# Patient Record
Sex: Female | Born: 1935
Health system: Southern US, Community
[De-identification: ages and names within clinical notes are randomized; demographics above are authoritative.]

## PROBLEM LIST (undated history)

## (undated) DIAGNOSIS — E785 Hyperlipidemia, unspecified: Secondary | ICD-10-CM

## (undated) DIAGNOSIS — I10 Essential (primary) hypertension: Secondary | ICD-10-CM

## (undated) DIAGNOSIS — E079 Disorder of thyroid, unspecified: Secondary | ICD-10-CM

## (undated) HISTORY — DX: Hyperlipidemia, unspecified: E78.5

---

## 2001-07-09 ENCOUNTER — Ambulatory Visit (HOSPITAL_COMMUNITY): Admission: RE | Admit: 2001-07-09 | Discharge: 2001-07-09 | Payer: Self-pay | Admitting: Specialist

## 2001-07-09 ENCOUNTER — Encounter: Payer: Self-pay | Admitting: Specialist

## 2001-08-28 ENCOUNTER — Other Ambulatory Visit: Admission: RE | Admit: 2001-08-28 | Discharge: 2001-08-28 | Payer: Self-pay | Admitting: Specialist

## 2002-07-19 ENCOUNTER — Ambulatory Visit (HOSPITAL_COMMUNITY): Admission: RE | Admit: 2002-07-19 | Discharge: 2002-07-19 | Payer: Self-pay | Admitting: Specialist

## 2002-07-19 ENCOUNTER — Encounter: Payer: Self-pay | Admitting: Specialist

## 2003-07-21 ENCOUNTER — Encounter: Payer: Self-pay | Admitting: Specialist

## 2003-07-21 ENCOUNTER — Ambulatory Visit (HOSPITAL_COMMUNITY): Admission: RE | Admit: 2003-07-21 | Discharge: 2003-07-21 | Payer: Self-pay | Admitting: Specialist

## 2004-01-26 ENCOUNTER — Ambulatory Visit (HOSPITAL_COMMUNITY): Admission: RE | Admit: 2004-01-26 | Discharge: 2004-01-26 | Payer: Self-pay | Admitting: Internal Medicine

## 2004-07-26 ENCOUNTER — Ambulatory Visit (HOSPITAL_COMMUNITY): Admission: RE | Admit: 2004-07-26 | Discharge: 2004-07-26 | Payer: Self-pay | Admitting: Specialist

## 2005-08-04 ENCOUNTER — Ambulatory Visit (HOSPITAL_COMMUNITY): Admission: RE | Admit: 2005-08-04 | Discharge: 2005-08-04 | Payer: Self-pay | Admitting: Family Medicine

## 2005-08-18 ENCOUNTER — Other Ambulatory Visit: Admission: RE | Admit: 2005-08-18 | Discharge: 2005-08-18 | Payer: Self-pay | Admitting: Dental General Practice

## 2006-01-16 ENCOUNTER — Ambulatory Visit (HOSPITAL_COMMUNITY): Admission: RE | Admit: 2006-01-16 | Discharge: 2006-01-16 | Payer: Self-pay | Admitting: Family Medicine

## 2006-01-27 ENCOUNTER — Ambulatory Visit (HOSPITAL_COMMUNITY): Admission: RE | Admit: 2006-01-27 | Discharge: 2006-01-27 | Payer: Self-pay | Admitting: Family Medicine

## 2006-08-08 ENCOUNTER — Ambulatory Visit (HOSPITAL_COMMUNITY): Admission: RE | Admit: 2006-08-08 | Discharge: 2006-08-08 | Payer: Self-pay | Admitting: Family Medicine

## 2007-08-13 ENCOUNTER — Ambulatory Visit (HOSPITAL_COMMUNITY): Admission: RE | Admit: 2007-08-13 | Discharge: 2007-08-13 | Payer: Self-pay | Admitting: Family Medicine

## 2008-02-29 ENCOUNTER — Ambulatory Visit (HOSPITAL_COMMUNITY): Admission: RE | Admit: 2008-02-29 | Discharge: 2008-02-29 | Payer: Self-pay | Admitting: Family Medicine

## 2008-08-15 ENCOUNTER — Ambulatory Visit (HOSPITAL_COMMUNITY): Admission: RE | Admit: 2008-08-15 | Discharge: 2008-08-15 | Payer: Self-pay | Admitting: Family Medicine

## 2009-04-17 ENCOUNTER — Ambulatory Visit (HOSPITAL_COMMUNITY): Admission: RE | Admit: 2009-04-17 | Discharge: 2009-04-17 | Payer: Self-pay | Admitting: Family Medicine

## 2009-08-19 ENCOUNTER — Ambulatory Visit (HOSPITAL_COMMUNITY): Admission: RE | Admit: 2009-08-19 | Discharge: 2009-08-19 | Payer: Self-pay | Admitting: Family Medicine

## 2010-01-27 ENCOUNTER — Ambulatory Visit (HOSPITAL_COMMUNITY): Admission: RE | Admit: 2010-01-27 | Discharge: 2010-01-27 | Payer: Self-pay | Admitting: Family Medicine

## 2010-08-24 ENCOUNTER — Ambulatory Visit (HOSPITAL_COMMUNITY): Admission: RE | Admit: 2010-08-24 | Discharge: 2010-08-24 | Payer: Self-pay | Admitting: Family Medicine

## 2011-05-06 NOTE — Op Note (Signed)
NAME:  FEVEN, ALDERFER                        ACCOUNT NO.:  [id number]   MEDICAL RECORD NO.:  [id number]                   PATIENT TYPE:  AMB   LOCATION:  DAY                                  FACILITY:  [facility]   PHYSICIAN:  Lionel December, M.D.                 DATE OF BIRTH:  Jan 18, 1929   DATE OF PROCEDURE:  01/26/2004  DATE OF DISCHARGE:                                 OPERATIVE REPORT   PROCEDURE:  Total colonoscopy.   ENDOSCOPIST:  Lionel December, M.D.   INDICATIONS:  Ms. Labree is a 77 year old Caucasian female who is undergoing  screening colonoscopy.  Family history is negative for colorectal carcinoma.  The procedure and risks were reviewed with the patient and informed consent  was obtained.   PREOPERATIVE MEDICATIONS:  Demerol 25 mg IV and Versed 3 mg IV in divided  dose   FINDINGS:  Procedure performed in endoscopy suite.  The patient's vital  signs and O2 saturation were monitored during the procedure and remained  stable.  The patient was placed in the left lateral recumbent position and  rectal examination was performed.  No abnormality noted on external or  digital exam.   Olympus videoscope was placed in the rectum and advanced under vision into  the sigmoid colon and beyond.  Preparation was excellent.  The scope was  passed to the cecum which was identified by appendiceal orifice and the  ileocecal valve.  Pictures were taken for the record.  As the scope was  withdrawn colonic mucosa was carefully examined and was normal throughout.  The rectal mucosa similarly was normal.   The scope was retroflexed to examine the anorectal junction and small  hemorrhoids were noted below the dentate line.  The endoscope was  straightened and withdrawn.  The patient tolerated the procedure well.   FINAL DIAGNOSIS:  Small external hemorrhoids, otherwise normal colonoscopy.   RECOMMENDATIONS:  1. She will resume her usual medications.  2. She will continue yearly Hemoccults  and will not need another screening     for CRC for 10 years.      ___________________________________________                                            Lionel December, M.D.   NR/MEDQ  D:  01/26/2004  T:  01/26/2004  Job:  [id number]   cc:   Leola Brazil, M.D.  [address] [place]  [place] [address]  Fax: [phone]   Patrica Duel, M.D.  [address]  [place]  [place] [address]  Fax: [phone]

## 2011-08-31 ENCOUNTER — Other Ambulatory Visit (HOSPITAL_COMMUNITY): Payer: Self-pay | Admitting: Internal Medicine

## 2011-08-31 DIAGNOSIS — Z139 Encounter for screening, unspecified: Secondary | ICD-10-CM

## 2011-09-06 ENCOUNTER — Ambulatory Visit (HOSPITAL_COMMUNITY): Payer: Medicare Other

## 2011-09-13 ENCOUNTER — Ambulatory Visit (HOSPITAL_COMMUNITY)
Admission: RE | Admit: 2011-09-13 | Discharge: 2011-09-13 | Disposition: A | Discharge status: Home / Self Care | Payer: Medicare Other | Source: Ambulatory Visit | Attending: Internal Medicine | Admitting: Internal Medicine

## 2011-09-13 DIAGNOSIS — Z1231 Encounter for screening mammogram for malignant neoplasm of breast: Secondary | ICD-10-CM | POA: Insufficient documentation

## 2011-09-13 DIAGNOSIS — Z139 Encounter for screening, unspecified: Secondary | ICD-10-CM

## 2012-03-13 ENCOUNTER — Other Ambulatory Visit (HOSPITAL_COMMUNITY): Payer: Self-pay | Admitting: Physician Assistant

## 2012-03-13 DIAGNOSIS — M810 Age-related osteoporosis without current pathological fracture: Secondary | ICD-10-CM

## 2012-03-13 DIAGNOSIS — Z6825 Body mass index (BMI) 25.0-25.9, adult: Secondary | ICD-10-CM | POA: Diagnosis not present

## 2012-03-13 DIAGNOSIS — I10 Essential (primary) hypertension: Secondary | ICD-10-CM | POA: Diagnosis not present

## 2012-03-19 ENCOUNTER — Ambulatory Visit (HOSPITAL_COMMUNITY)
Admission: RE | Admit: 2012-03-19 | Discharge: 2012-03-19 | Disposition: A | Discharge status: Home / Self Care | Payer: Medicare Other | Source: Ambulatory Visit | Attending: Physician Assistant | Admitting: Physician Assistant

## 2012-03-19 DIAGNOSIS — Z13820 Encounter for screening for osteoporosis: Secondary | ICD-10-CM | POA: Diagnosis not present

## 2012-03-19 DIAGNOSIS — Z780 Asymptomatic menopausal state: Secondary | ICD-10-CM | POA: Diagnosis not present

## 2012-03-19 DIAGNOSIS — M810 Age-related osteoporosis without current pathological fracture: Secondary | ICD-10-CM

## 2012-03-19 DIAGNOSIS — M899 Disorder of bone, unspecified: Secondary | ICD-10-CM | POA: Insufficient documentation

## 2012-03-19 DIAGNOSIS — M949 Disorder of cartilage, unspecified: Secondary | ICD-10-CM | POA: Insufficient documentation

## 2012-04-06 ENCOUNTER — Encounter (HOSPITAL_COMMUNITY): Payer: Self-pay | Admitting: *Deleted

## 2012-04-06 ENCOUNTER — Emergency Department (HOSPITAL_COMMUNITY)
Admission: EM | Admit: 2012-04-06 | Discharge: 2012-04-06 | Disposition: A | Discharge status: Home / Self Care | Payer: Medicare Other | Attending: Emergency Medicine | Admitting: Emergency Medicine

## 2012-04-06 DIAGNOSIS — E079 Disorder of thyroid, unspecified: Secondary | ICD-10-CM | POA: Insufficient documentation

## 2012-04-06 DIAGNOSIS — E869 Volume depletion, unspecified: Secondary | ICD-10-CM | POA: Diagnosis not present

## 2012-04-06 DIAGNOSIS — R61 Generalized hyperhidrosis: Secondary | ICD-10-CM | POA: Diagnosis not present

## 2012-04-06 DIAGNOSIS — E876 Hypokalemia: Secondary | ICD-10-CM | POA: Insufficient documentation

## 2012-04-06 DIAGNOSIS — IMO0001 Reserved for inherently not codable concepts without codable children: Secondary | ICD-10-CM | POA: Insufficient documentation

## 2012-04-06 DIAGNOSIS — E860 Dehydration: Secondary | ICD-10-CM | POA: Insufficient documentation

## 2012-04-06 DIAGNOSIS — R5381 Other malaise: Secondary | ICD-10-CM | POA: Diagnosis not present

## 2012-04-06 DIAGNOSIS — R350 Frequency of micturition: Secondary | ICD-10-CM | POA: Diagnosis not present

## 2012-04-06 DIAGNOSIS — R197 Diarrhea, unspecified: Secondary | ICD-10-CM | POA: Insufficient documentation

## 2012-04-06 DIAGNOSIS — R110 Nausea: Secondary | ICD-10-CM | POA: Insufficient documentation

## 2012-04-06 DIAGNOSIS — N390 Urinary tract infection, site not specified: Secondary | ICD-10-CM | POA: Diagnosis not present

## 2012-04-06 DIAGNOSIS — R42 Dizziness and giddiness: Secondary | ICD-10-CM | POA: Diagnosis not present

## 2012-04-06 HISTORY — DX: Disorder of thyroid, unspecified: E07.9

## 2012-04-06 LAB — URINE MICROSCOPIC-ADD ON

## 2012-04-06 LAB — URINALYSIS, ROUTINE W REFLEX MICROSCOPIC
Glucose, UA: NEGATIVE mg/dL
Nitrite: POSITIVE — AB
Protein, ur: 30 mg/dL — AB
Specific Gravity, Urine: 1.015 (ref 1.005–1.030)
pH: 6 (ref 5.0–8.0)

## 2012-04-06 LAB — BASIC METABOLIC PANEL
CO2: 28 mEq/L (ref 19–32)
GFR calc Af Amer: 58 mL/min — ABNORMAL LOW (ref >90)
GFR calc non Af Amer: 50 mL/min — ABNORMAL LOW (ref >90)
Glucose, Bld: 143 mg/dL — ABNORMAL HIGH (ref 70–99)

## 2012-04-06 LAB — DIFFERENTIAL
Eosinophils Relative: 0 % (ref 0–5)
Monocytes Absolute: 0.4 10*3/uL (ref 0.1–1.0)
Neutro Abs: 4.9 10*3/uL (ref 1.7–7.7)
Neutrophils Relative %: 77 % (ref 43–77)

## 2012-04-06 LAB — CBC
HCT: 33.9 % — ABNORMAL LOW (ref 36.0–46.0)
MCH: 30.5 pg (ref 26.0–34.0)
MCHC: 35.7 g/dL (ref 30.0–36.0)
MCV: 85.4 fL (ref 78.0–100.0)
Platelets: 113 10*3/uL — ABNORMAL LOW (ref 150–400)

## 2012-04-06 LAB — CARDIAC PANEL(CRET KIN+CKTOT+MB+TROPI): Relative Index: 1.4 (ref 0.0–2.5)

## 2012-04-06 MED ORDER — DEXTROSE 5 % IV SOLN
1.0000 g | Freq: Once | INTRAVENOUS | Status: AC
  Administered 2012-04-06: 1 g via INTRAVENOUS
  Filled 2012-04-06: qty 10

## 2012-04-06 MED ORDER — POTASSIUM CHLORIDE 20 MEQ/15ML (10%) PO LIQD
40.0000 meq | Freq: Once | ORAL | Status: AC
  Administered 2012-04-06: 40 meq via ORAL
  Filled 2012-04-06: qty 30

## 2012-04-06 MED ORDER — SODIUM CHLORIDE 0.9 % IV BOLUS (SEPSIS)
500.0000 mL | Freq: Once | INTRAVENOUS | Status: AC
  Administered 2012-04-06: 500 mL via INTRAVENOUS

## 2012-04-06 MED ORDER — ONDANSETRON HCL 4 MG/2ML IJ SOLN
INTRAMUSCULAR | Status: AC
  Administered 2012-04-06: 4 mg
  Filled 2012-04-06: qty 2

## 2012-04-06 MED ORDER — CEPHALEXIN 500 MG PO CAPS: 500.0000 mg | ORAL_CAPSULE | Freq: Three times a day (TID) | ORAL | Status: AC

## 2012-04-06 MED ORDER — POTASSIUM CHLORIDE 10 MEQ/100ML IV SOLN
10.0000 meq | Freq: Once | INTRAVENOUS | Status: AC
  Administered 2012-04-06: 10 meq via INTRAVENOUS
  Filled 2012-04-06: qty 100

## 2012-04-06 NOTE — ED Notes (Signed)
Pt c/o burning at IV site. Potassium infusion slowed to 50 ml/hr.

## 2012-04-06 NOTE — ED Notes (Addendum)
Pt presents to er from urgent care with c/o generalized weakness, feeling tired, not eating well since April 3, pt states that she started feeling "bad" after she began to take a new thyroid medication, states that she did stop taking the medication this past Sunday, admits to nausea, diarrhea. denies any vomiting, urgent care advised that pt was orthostatic with bp of 124/64 lying, 110/60 sitting, 114/60 standing. Pt states that she does get dizzy with position changes.

## 2012-04-06 NOTE — ED Provider Notes (Signed)
History  This chart was scribed for Lori Gaskins, MD by Cherlynn Perches. The patient was seen in room APA07/APA07. Patient's care was started at 1144.    CSN: [id number]  Arrival date & time 04/06/12  1144   First MD Initiated Contact with Patient 04/06/12 1154      Chief Complaint  Patient presents with  . Weakness  . Dizziness     HPI Lori Edwards is a 78 y.o. female with a h/o thyroid disease who presents to the Emergency Department complaining of about 3 weeks of sudden onset, unchanged, constant fatigue with associated night sweats, dizziness, muscle aches, diarrhea, decreased appetite, and nausea. Pt also reports having one episode of syncope 5 days ago but denies any chest pain.  She reports brief episode of LOC but no HA reported and no CP/SOB with syncope  Pt states that symptoms began after taking a new thyroid medication (Synthroid) prescribed by PCP. Pt stopped taking medication 5 days ago but symptoms have persisted. Pt was referred to the ED after visiting urgent care. Pt denies vomiting, chest pain, cough, confusion, and HA. Pt denies smoking and alcohol use. Nothing improves her symptoms Nothing worsens her symptoms   Past Medical History  Diagnosis Date  . Thyroid disease     History reviewed. No pertinent past surgical history.  No family history on file.  History  Substance Use Topics  . Smoking status: Never Smoker   . Smokeless tobacco: Not on file  . Alcohol Use: No    OB History    Grav Para Term Preterm Abortions TAB SAB Ect Mult Living                  Review of Systems 10 Systems reviewed and all are negative for acute change except as noted in the HPI.   Allergies  Review of patient's allergies indicates no known allergies.  Home Medications   Current Outpatient Rx  Name Route Sig Dispense Refill  . ALENDRONATE SODIUM 70 MG PO TABS Oral Take 70 mg by mouth every 7 (seven) days. Take with a full glass of water on an empty  stomach. On Sunday    . AMLODIPINE BESYLATE 5 MG PO TABS Oral Take 5 mg by mouth daily.    . ASPIRIN EC 81 MG PO TBEC Oral Take 81 mg by mouth daily.    . OMEGA-3 FATTY ACIDS 1000 MG PO CAPS Oral Take 1 g by mouth daily.    Marland Kitchen HYDROCHLOROTHIAZIDE 25 MG PO TABS Oral Take 12.5 mg by mouth daily.    . ONE-A-DAY ENERGY PO Oral Take 1 tablet by mouth daily.    Frazier Butt OP Ophthalmic Apply 1 drop to eye daily as needed. Dry Eyes    . VITAMIN D (CHOLECALCIFEROL) PO Oral Take 1 capsule by mouth daily.      BP 120/50  Pulse 77  Temp(Src) 97.8 F (36.6 C) (Oral)  Resp 18  Ht 5' (1.524 m)  Wt 125 lb (56.7 kg)  BMI 24.41 kg/m2  SpO2 96%  Physical Exam CONSTITUTIONAL: Well developed/well nourished HEAD AND FACE: Normocephalic/atraumatic EYES: EOMI/PERRL ENMT: Mucous membranes moist NECK: supple no meningeal signs SPINE:entire spine nontender CV: S1/S2 noted, no murmurs/rubs/gallops noted LUNGS: Lungs are clear to auscultation bilaterally, no apparent distress ABDOMEN: soft, nontender, no rebound or guarding GU:no cva tenderness NEURO: Pt is awake/alert, moves all extremitiesx4 EXTREMITIES: pulses normal, full ROM SKIN: warm, color normal PSYCH: no abnormalities of mood noted  ED Course  Procedures   DIAGNOSTIC STUDIES: Oxygen Saturation is 96% on room air, adequate by my interpretation.    COORDINATION OF CARE: 12:33PM - Patient understands and agrees with initial ED impression and plan with expectations set for ED visit. 1:37 PM Dehydration, uti, and hypokalemia noted Will treat and reassess Pt is in no distress, well appearing, nontoxic 2:06PM- Patient informed of current labs.   3:36 PM Pt ambulatory She feels comfortable for d/c, she is awake/alert, normal mental status.  Denies any cp/sob I advised her to stop HCTZ as likely cause of dehydration and hypokalemia hypoK without signs of EKG changes This was replaced today I will start her on keflex for uti I will also  arrange f/u with PCP Family at bedside they feel comfortable with plan.  Suspect hyponatremia from dehydration  The patient appears reasonably screened and/or stabilized for discharge and I doubt any other medical condition or other [facility] requiring further screening, evaluation, or treatment in the ED at this time prior to discharge.   Labs Reviewed  CBC - Abnormal; Notable for the following:    HCT 33.9 (*)    Platelets 113 (*)    All other components within normal limits  BASIC METABOLIC PANEL - Abnormal; Notable for the following:    Sodium 126 (*)    Potassium 2.7 (*)    Chloride 87 (*)    Glucose, Bld 143 (*)    BUN 25 (*)    GFR calc non Af Amer 50 (*)    GFR calc Af Amer 58 (*)    All other components within normal limits  URINALYSIS, ROUTINE W REFLEX MICROSCOPIC - Abnormal; Notable for the following:    APPearance CLOUDY (*)    Hgb urine dipstick MODERATE (*)    Protein, ur 30 (*)    Nitrite POSITIVE (*)    Leukocytes, UA SMALL (*)    All other components within normal limits  CARDIAC PANEL(CRET KIN+CKTOT+MB+TROPI) - Abnormal; Notable for the following:    Total CK 227 (*)    All other components within normal limits  URINE MICROSCOPIC-ADD ON - Abnormal; Notable for the following:    Bacteria, UA MANY (*)    Casts GRANULAR CAST (*)    All other components within normal limits  DIFFERENTIAL     MDM  Nursing notes reviewed and considered in documentation All labs/vitals reviewed and considered      Date: 04/06/2012  Rate: 74  Rhythm: normal sinus rhythm  QRS Axis: normal  Intervals: normal  ST/T Wave abnormalities: nonspecific ST changes  Conduction Disutrbances:none    I personally performed the services described in this documentation, which was scribed in my presence. The recorded information has been reviewed and considered.      Lori Gaskins, MD 04/06/12 1538

## 2012-04-06 NOTE — ED Notes (Signed)
Pt up to bathroom. Ambulates without difficulty.

## 2012-04-06 NOTE — Discharge Instructions (Signed)
Please have your electrolytes (potassium) rechecked within 48-72 hours either in the ER or at your primary care doctor.  Stop your HCTZ as we discussed  Dehydration, Elderly Dehydration is when you lose more fluids from the body than you take in. Vital organs such as the kidneys, brain, and heart cannot function without a proper amount of fluids and salt. Any loss of fluids from the body can cause dehydration.  Older adults are at a higher risk of dehydration than younger adults. As we age, our bodies are less able to conserve water and do not respond to temperature changes as well. Also, older adults do not become thirsty as easily or quickly. Because of this, older adults often do not realize they need to increase fluids to avoid dehydration.  CAUSES   Vomiting.   Diarrhea.   Excessive sweating.   Excessive urine output.   Fever.  SYMPTOMS  Mild dehydration  Thirst.   Dry lips.   Slightly dry mouth.  Moderate dehydration  Very dry mouth.   Sunken eyes.   Skin does not bounce back quickly when lightly pinched and released.   Dark urine and decreased urine production.   Decreased tear production.   Headache.  Severe dehydration  Very dry mouth.   Extreme thirst.   Rapid, weak pulse (more than 100 beats per minute at rest).   Cold hands and feet.   Not able to sweat in spite of heat.   Rapid breathing.   Blue lips.   Confusion and lethargy.   Difficulty being awakened.   Minimal urine production.   No tears.  DIAGNOSIS  Your caregiver will diagnose dehydration based on your symptoms and your exam. Blood and urine tests will help confirm the diagnosis. The diagnostic evaluation should also identify the cause of dehydration. TREATMENT  Treatment of mild or moderate dehydration can often be done at home by increasing the amount of fluids that you drink. It is best to drink small amounts of fluid more often. Drinking too much at one time can make vomiting  worse.  Severe dehydration needs to be treated at the hospital where you will probably be given intravenous (IV) fluids that contain water and electrolytes. HOME CARE INSTRUCTIONS   Ask your caregiver about specific rehydration instructions.   Drink enough fluids to keep your urine clear or pale yellow.   Drink small amounts frequently if you have nausea and vomiting.   Eat as you normally do.   Avoid:   Foods or drinks high in sugar.   Carbonated drinks.   Juice.   Extremely hot or cold fluids.   Drinks with caffeine.   Fatty, greasy foods.   Alcohol.   Tobacco.   Overeating.   Gelatin desserts.   Wash your hands well to avoid spreading bacteria and viruses.   Only take over-the-counter or prescription medicines for pain, discomfort, or fever as directed by your caregiver.   Ask your caregiver if you should continue all prescribed and over-the-counter medicines.   Keep all follow-up appointments with your caregiver.  SEEK MEDICAL CARE IF:  You have abdominal pain and it increases or stays in one area (localizes).   You have a rash, stiff neck, or severe headache.   You are irritable, sleepy, or difficult to awaken.   You are weak, dizzy, or extremely thirsty.  SEEK IMMEDIATE MEDICAL CARE IF:   You are unable to keep fluids down, or you get worse despite treatment.   You have frequent episodes  of vomiting or diarrhea.   You have blood or green matter (bile) in your vomit.   You have blood in your stool or your stool looks black and tarry.   You have not urinated in 6 to 8 hours, or you have only urinated a small amount of very dark urine.   You have a fever.   You faint.  MAKE SURE YOU:   Understand these instructions.   Will watch your condition.   Will get help right away if you are not doing well or get worse.  Document Released: 02/25/2004 Document Revised: 11/24/2011 Document Reviewed: 07/25/2011 [facility] Patient Information 2012  [place], [place].

## 2012-04-06 NOTE — ED Notes (Signed)
CRITICAL VALUE ALERT  Critical value received:  K-2.7  Date of notification:  04/06/12  Time of notification:  1318  Critical value read back:yes  Nurse who received alert:  t abbott rn  MD notified (1st page):  [place]  Time of first page:  1318  MD notified (2nd page):  Time of second page:  Responding MD:  Bebe Shaggy  Time MD responded:  1318

## 2012-04-09 ENCOUNTER — Encounter (HOSPITAL_COMMUNITY): Payer: Self-pay | Admitting: Emergency Medicine

## 2012-04-09 ENCOUNTER — Emergency Department (HOSPITAL_COMMUNITY)
Admission: EM | Admit: 2012-04-09 | Discharge: 2012-04-09 | Disposition: A | Discharge status: Home / Self Care | Payer: Medicare Other | Attending: Emergency Medicine | Admitting: Emergency Medicine

## 2012-04-09 DIAGNOSIS — R7989 Other specified abnormal findings of blood chemistry: Secondary | ICD-10-CM | POA: Diagnosis not present

## 2012-04-09 DIAGNOSIS — E876 Hypokalemia: Secondary | ICD-10-CM | POA: Diagnosis not present

## 2012-04-09 LAB — DIFFERENTIAL
Basophils Relative: 3 % — ABNORMAL HIGH (ref 0–1)
Eosinophils Absolute: 0 10*3/uL (ref 0.0–0.7)
Monocytes Relative: 12 % (ref 3–12)
Neutrophils Relative %: 35 % — ABNORMAL LOW (ref 43–77)

## 2012-04-09 LAB — COMPREHENSIVE METABOLIC PANEL
ALT: 300 U/L — ABNORMAL HIGH (ref 0–35)
AST: 359 U/L — ABNORMAL HIGH (ref 0–37)
Alkaline Phosphatase: 293 U/L — ABNORMAL HIGH (ref 39–117)
CO2: 28 mEq/L (ref 19–32)
Chloride: 100 mEq/L (ref 96–112)
GFR calc non Af Amer: 77 mL/min — ABNORMAL LOW (ref >90)
Sodium: 136 mEq/L (ref 135–145)
Total Bilirubin: 0.7 mg/dL (ref 0.3–1.2)

## 2012-04-09 LAB — URINE CULTURE

## 2012-04-09 LAB — CBC
Hemoglobin: 11.6 g/dL — ABNORMAL LOW (ref 12.0–15.0)
MCH: 30.5 pg (ref 26.0–34.0)
MCHC: 34.8 g/dL (ref 30.0–36.0)
Platelets: 230 10*3/uL (ref 150–400)
RDW: 13.5 % (ref 11.5–15.5)

## 2012-04-09 MED ORDER — POTASSIUM CHLORIDE CRYS ER 10 MEQ PO TBCR: 10.0000 meq | EXTENDED_RELEASE_TABLET | Freq: Every day | ORAL | Status: DC

## 2012-04-09 MED ORDER — POTASSIUM CHLORIDE CRYS ER 20 MEQ PO TBCR
60.0000 meq | EXTENDED_RELEASE_TABLET | Freq: Once | ORAL | Status: AC
  Administered 2012-04-09: 60 meq via ORAL
  Filled 2012-04-09: qty 3

## 2012-04-09 NOTE — ED Provider Notes (Signed)
History   This chart was scribed for [employer]. Colon Branch, MD by Brooks Sailors. The patient was seen in room APA10/APA10. Patient's care was started at 0924.   CSN: [id number]  Arrival date & time 04/09/12  7829   First MD Initiated Contact with Patient 04/09/12 1116      Chief Complaint  Patient presents with  . Follow-up    (Consider location/radiation/quality/duration/timing/severity/associated sxs/prior treatment) HPI Lori Edwards is a 78 y.o. female who presents to the Emergency Department for a follow up visit to have her potassium checked. Patient was at Urgent Care  04/06/12 and sent to the ER for futher work up. Here today for recheck of labs. Patient says she recently was on thyroid medication as a result of being called by PCP with low thyroid labs.She stated her illness began with taking the thyroid medicines. She stopped taking them 04/07/12.  Dr. Sherwood Gambler - PCP  Past Medical History  Diagnosis Date  . Thyroid disease     History reviewed. No pertinent past surgical history.  No family history on file.  History  Substance Use Topics  . Smoking status: Never Smoker   . Smokeless tobacco: Not on file  . Alcohol Use: No    OB History    Grav Para Term Preterm Abortions TAB SAB Ect Mult Living                  Review of Systems  Constitutional: Negative for fever.  HENT: Negative for congestion.   Eyes: Negative for discharge and redness.  Respiratory: Negative for cough and shortness of breath.   Cardiovascular: Negative for chest pain.  Gastrointestinal: Negative for vomiting and abdominal pain.  Musculoskeletal: Negative for back pain.  Skin: Negative for rash.  Neurological: Negative for syncope, numbness and headaches.  Psychiatric/Behavioral:       No behavior change.  All other systems reviewed and are negative.    Allergies  Review of patient's allergies indicates no known allergies.  Home Medications   Current Outpatient Rx  Name Route Sig  Dispense Refill  . ALENDRONATE SODIUM 70 MG PO TABS Oral Take 70 mg by mouth every 7 (seven) days. Take with a full glass of water on an empty stomach. On Sunday    . AMLODIPINE BESYLATE 5 MG PO TABS Oral Take 5 mg by mouth daily.    . ASPIRIN EC 81 MG PO TBEC Oral Take 81 mg by mouth daily.    . CEPHALEXIN 500 MG PO CAPS Oral Take 1 capsule (500 mg total) by mouth 3 (three) times daily. 30 capsule 0  . OMEGA-3 FATTY ACIDS 1000 MG PO CAPS Oral Take 1 g by mouth daily.    . ONE-A-DAY ENERGY PO Oral Take 1 tablet by mouth daily.    Frazier Butt OP Ophthalmic Apply 1 drop to eye daily as needed. Dry Eyes    . VITAMIN D (CHOLECALCIFEROL) PO Oral Take 1 capsule by mouth daily.      BP 135/65  Pulse 73  Temp 98.6 F (37 C)  Resp 16  Ht 5' (1.524 m)  Wt 120 lb (54.432 kg)  BMI 23.44 kg/m2  SpO2 99%  Physical Exam  Nursing note and vitals reviewed. Constitutional: She is oriented to person, place, and time. She appears well-developed and well-nourished.  HENT:  Head: Normocephalic and atraumatic.  Eyes: Conjunctivae and EOM are normal. Pupils are equal, round, and reactive to light.  Neck: Normal range of motion. Neck supple.  Cardiovascular: Normal rate and regular rhythm.   Pulmonary/Chest: Effort normal and breath sounds normal.  Abdominal: Soft. Bowel sounds are normal.  Musculoskeletal: Normal range of motion.  Neurological: She is alert and oriented to person, place, and time.  Skin: Skin is warm and dry.  Psychiatric: She has a normal mood and affect.    ED Course  Procedures (including critical care time) DIAGNOSTIC STUDIES: Oxygen Saturation is 99% on room air, normal by my interpretation.   Results for orders placed during the hospital encounter of 04/09/12  COMPREHENSIVE METABOLIC PANEL      Component Value Range   Sodium 136  135 - 145 (mEq/L)   Potassium 3.2 (*) 3.5 - 5.1 (mEq/L)   Chloride 100  96 - 112 (mEq/L)   CO2 28  19 - 32 (mEq/L)   Glucose, Bld 134 (*) 70 -  99 (mg/dL)   BUN 9  6 - 23 (mg/dL)   Creatinine, Ser 9.60  0.50 - 1.10 (mg/dL)   Calcium 8.7  8.4 - 45.4 (mg/dL)   Total Protein 6.9  6.0 - 8.3 (g/dL)   Albumin 3.0 (*) 3.5 - 5.2 (g/dL)   AST 098 (*) 0 - 37 (U/L)   ALT 300 (*) 0 - 35 (U/L)   Alkaline Phosphatase 293 (*) 39 - 117 (U/L)   Total Bilirubin 0.7  0.3 - 1.2 (mg/dL)   GFR calc non Af Amer 77 (*) >90 (mL/min)   GFR calc Af Amer 90 (*) >90 (mL/min)  CBC      Component Value Range   WBC 7.9  4.0 - 10.5 (K/uL)   RBC 3.80 (*) 3.87 - 5.11 (MIL/uL)   Hemoglobin 11.6 (*) 12.0 - 15.0 (g/dL)   HCT 11.9 (*) 14.7 - 46.0 (%)   MCV 87.6  78.0 - 100.0 (fL)   MCH 30.5  26.0 - 34.0 (pg)   MCHC 34.8  30.0 - 36.0 (g/dL)   RDW 82.9  56.2 - 13.0 (%)   Platelets 230  150 - 400 (K/uL)  DIFFERENTIAL      Component Value Range   Neutrophils Relative 35 (*) 43 - 77 (%)   Neutro Abs 2.8  1.7 - 7.7 (K/uL)   Lymphocytes Relative 49 (*) 12 - 46 (%)   Lymphs Abs 3.9  0.7 - 4.0 (K/uL)   Monocytes Relative 12  3 - 12 (%)   Monocytes Absolute 1.0  0.1 - 1.0 (K/uL)   Eosinophils Relative 0  0 - 5 (%)   Eosinophils Absolute 0.0  0.0 - 0.7 (K/uL)   Basophils Relative 3 (*) 0 - 1 (%)   Basophils Absolute 0.2 (*) 0.0 - 0.1 (K/uL)   WBC Morphology ATYPICAL LYMPHOCYTES     COORDINATION OF CARE: 11:20AM-  Patient to be given potassium at hospital and sent home  with prescription and to follow up with Dr. Sherwood Gambler.     No results found.   No diagnosis found.    MDM  Patient here for recheck of potassium from visit on 04/06/12 when she presented with nausea, vomiting and weakness. Repeat labs with continued low potassium. LFTs ar elevated. None to compare. Patient has a follow up visit with Dr. Sherwood Gambler on 04/17/12. She will remain off HCTZ, continue taking potassium.She will have labs redrawn at that visit and will discuss thyroid medicines. She refuses to restart thyroid medicines at this time. Pt stable in ED with no significant deterioration in  condition.The patient appears reasonably screened and/or stabilized for discharge and  I doubt any other medical condition or other [facility] requiring further screening, evaluation, or treatment in the ED at this time prior to discharge.  I personally performed the services described in this documentation, which was scribed in my presence. The recorded information has been reviewed and considered.   MDM Reviewed: previous chart, nursing note and vitals Reviewed previous: labs Interpretation: labs            Nicoletta Dress. Colon Branch, MD 04/09/12 2211

## 2012-04-09 NOTE — Discharge Instructions (Signed)
Your blood work here showed  A slightly low potassium at 3.2  When you were here the other day it was 2.7  Your liver numbers are high. When you see Dr. Sherwood Gambler on 04/17/12 have him recheck those numbers as well as your potassium. Continue NOT to take your hydroclorothiazide. Dr. Sherwood Gambler will let you know if you need to re start that medicine.   Hypokalemia Hypokalemia means a low potassium level in the blood.Potassium is an electrolyte that helps regulate the amount of fluid in the body. It also stimulates muscle contraction and maintains a stable acid-base balance.Most of the body's potassium is inside of cells, and only a very small amount is in the blood. Because the amount in the blood is so small, minor changes can have big effects. PREPARATION FOR TEST Testing for potassium requires taking a blood sample taken by needle from a vein in the arm. The skin is cleaned thoroughly before the sample is drawn. There is no other special preparation needed. NORMAL VALUES Potassium levels below 3.5 mEq/L are abnormally low. Levels above 5.1 mEq/L are abnormally high. Ranges for normal findings may vary among different laboratories and hospitals. You should always check with your doctor after having lab work or other tests done to discuss the meaning of your test results and whether your values are considered within normal limits. MEANING OF TEST  Your caregiver will go over the test results with you and discuss the importance and meaning of your results, as well as treatment options and the need for additional tests, if necessary. A potassium level is frequently part of a routine medical exam. It is usually included as part of a whole "panel" of tests for several blood salts (such as Sodium and Chloride). It may be done as part of follow-up when a low potassium level was found in the past or other blood salts are suspected of being out of balance. A low potassium level might be suspected if you have one or more  of the following:  Symptoms of weakness.   Abnormal heart rhythms.   High blood pressure and are taking medication to control this, especially water pills (diuretics).   Kidney disease that can affect your potassium level .   Diabetes requiring the use of insulin. The potassium may fall after taking insulin, especially if the diabetes had been out of control for a while.   A condition requiring the use of cortisone-type medication or certain types of antibiotics.   Vomiting and/or diarrhea for more than a day or two.   A stomach or intestinal condition that may not permit appropriate absorption of potassium.   Fainting episodes.   Mental confusion.  OBTAINING TEST RESULTS It is your responsibility to obtain your test results. Ask the lab or department performing the test when and how you will get your results.  Please contact your caregiver directly if you have not received the results within one week. At that time, ask if there is anything different or new you should be doing in relation to the results. TREATMENT Hypokalemia can be treated with potassium supplements taken by mouth and/or adjustments in your current medications. A diet high in potassium is also helpful. Foods with high potassium content are:  Peas, lentils, lima beans, nuts, and dried fruit.   Whole grain and bran cereals and breads.   Fresh fruit, vegetables (bananas, cantaloupe, grapefruit, oranges, tomatoes, honeydew melons, potatoes).   Orange and tomato juices.   Meats. If potassium supplement has been  prescribed for you today or your medications have been adjusted, see your personal caregiver in time02 for a re-check.  SEEK MEDICAL CARE IF:  There is a feeling of worsening weakness.   You experience repeated chest palpitations.   You are diabetic and having difficulty keeping your blood sugars in the normal range.   You are experiencing vomiting and/or diarrhea.   You are having difficulty with  any of your regular medications.  SEEK IMMEDIATE MEDICAL CARE IF:  You experience chest pain, shortness of breath, or episodes of dizziness.   You have been having vomiting or diarrhea for more than 2 days.   You have a fainting episode.  MAKE SURE YOU:   Understand these instructions.   Will watch your condition.   Will get help right away if you are not doing well or get worse.  Document Released: 12/05/2005 Document Revised: 11/24/2011 Document Reviewed: 11/15/2008 [facility] Patient Information 2012 [place], [place].

## 2012-04-09 NOTE — ED Notes (Signed)
Pt states she is here to have her potassium re-checked. Pt here on 4/19 with hypokalemia.

## 2012-04-10 NOTE — ED Notes (Addendum)
+   Urine Patient treated with Keflex-sensitive to same- chart appended per protocol MD,

## 2012-04-17 DIAGNOSIS — E876 Hypokalemia: Secondary | ICD-10-CM | POA: Diagnosis not present

## 2012-04-17 DIAGNOSIS — D539 Nutritional anemia, unspecified: Secondary | ICD-10-CM | POA: Diagnosis not present

## 2012-04-17 DIAGNOSIS — I10 Essential (primary) hypertension: Secondary | ICD-10-CM | POA: Diagnosis not present

## 2012-04-17 DIAGNOSIS — D649 Anemia, unspecified: Secondary | ICD-10-CM | POA: Diagnosis not present

## 2012-04-17 DIAGNOSIS — R5383 Other fatigue: Secondary | ICD-10-CM | POA: Diagnosis not present

## 2012-04-17 DIAGNOSIS — M810 Age-related osteoporosis without current pathological fracture: Secondary | ICD-10-CM | POA: Diagnosis not present

## 2012-04-17 DIAGNOSIS — E039 Hypothyroidism, unspecified: Secondary | ICD-10-CM | POA: Diagnosis not present

## 2012-04-24 DIAGNOSIS — N390 Urinary tract infection, site not specified: Secondary | ICD-10-CM | POA: Diagnosis not present

## 2012-04-30 DIAGNOSIS — Z0289 Encounter for other administrative examinations: Secondary | ICD-10-CM | POA: Diagnosis not present

## 2012-05-31 DIAGNOSIS — D539 Nutritional anemia, unspecified: Secondary | ICD-10-CM | POA: Diagnosis not present

## 2012-07-09 DIAGNOSIS — H40039 Anatomical narrow angle, unspecified eye: Secondary | ICD-10-CM | POA: Diagnosis not present

## 2012-07-09 DIAGNOSIS — H5200 Hypermetropia, unspecified eye: Secondary | ICD-10-CM | POA: Diagnosis not present

## 2012-07-09 DIAGNOSIS — H2589 Other age-related cataract: Secondary | ICD-10-CM | POA: Diagnosis not present

## 2012-07-09 DIAGNOSIS — H52229 Regular astigmatism, unspecified eye: Secondary | ICD-10-CM | POA: Diagnosis not present

## 2012-08-02 DIAGNOSIS — H40039 Anatomical narrow angle, unspecified eye: Secondary | ICD-10-CM | POA: Diagnosis not present

## 2012-09-11 ENCOUNTER — Other Ambulatory Visit (HOSPITAL_COMMUNITY): Payer: Self-pay | Admitting: Internal Medicine

## 2012-09-11 DIAGNOSIS — Z139 Encounter for screening, unspecified: Secondary | ICD-10-CM

## 2012-09-18 ENCOUNTER — Ambulatory Visit (HOSPITAL_COMMUNITY)
Admission: RE | Admit: 2012-09-18 | Discharge: 2012-09-18 | Disposition: A | Discharge status: Home / Self Care | Payer: Medicare Other | Source: Ambulatory Visit | Attending: Internal Medicine | Admitting: Internal Medicine

## 2012-09-18 DIAGNOSIS — Z1231 Encounter for screening mammogram for malignant neoplasm of breast: Secondary | ICD-10-CM | POA: Insufficient documentation

## 2012-09-18 DIAGNOSIS — Z139 Encounter for screening, unspecified: Secondary | ICD-10-CM

## 2012-09-21 ENCOUNTER — Other Ambulatory Visit: Payer: Self-pay | Admitting: Internal Medicine

## 2012-09-21 DIAGNOSIS — R928 Other abnormal and inconclusive findings on diagnostic imaging of breast: Secondary | ICD-10-CM

## 2012-10-03 ENCOUNTER — Ambulatory Visit (HOSPITAL_COMMUNITY)
Admission: RE | Admit: 2012-10-03 | Discharge: 2012-10-03 | Disposition: A | Discharge status: Home / Self Care | Payer: Medicare Other | Source: Ambulatory Visit | Attending: Internal Medicine | Admitting: Internal Medicine

## 2012-10-03 DIAGNOSIS — R928 Other abnormal and inconclusive findings on diagnostic imaging of breast: Secondary | ICD-10-CM | POA: Diagnosis not present

## 2013-03-11 DIAGNOSIS — IMO0002 Reserved for concepts with insufficient information to code with codable children: Secondary | ICD-10-CM | POA: Diagnosis not present

## 2013-03-11 DIAGNOSIS — M810 Age-related osteoporosis without current pathological fracture: Secondary | ICD-10-CM | POA: Diagnosis not present

## 2013-03-11 DIAGNOSIS — I10 Essential (primary) hypertension: Secondary | ICD-10-CM | POA: Diagnosis not present

## 2013-03-18 DIAGNOSIS — I10 Essential (primary) hypertension: Secondary | ICD-10-CM | POA: Diagnosis not present

## 2013-03-18 DIAGNOSIS — Z79899 Other long term (current) drug therapy: Secondary | ICD-10-CM | POA: Diagnosis not present

## 2013-04-25 ENCOUNTER — Encounter (HOSPITAL_COMMUNITY): Payer: Self-pay | Admitting: Dietician

## 2013-04-25 NOTE — Progress Notes (Signed)
[facility] Diabetes Class Completion  Date:Apr 25, 2013  Time: 1730  Pt attended [facility]'s Diabetes [facility] Education Class on Apr 25, 2013.   Patient was educated on the following topics: survival skills (signs and symptoms of hyperglycemia and hypoglycemia, treatment for hypoglycemia, ideal levels for fasting and postprandial blood sugars, goal Hgb A1c level, foot care basics), recommendations for physical activity, carbohydrate metabolism in relation to diabetes, and meal planning (sources of carbohydrate, carbohydrate counting, meal planning strategies, food label reading, and portion control).   Sweta Halseth A. Kayan, RD, LDN  

## 2013-09-05 DIAGNOSIS — H524 Presbyopia: Secondary | ICD-10-CM | POA: Diagnosis not present

## 2013-09-05 DIAGNOSIS — H5200 Hypermetropia, unspecified eye: Secondary | ICD-10-CM | POA: Diagnosis not present

## 2013-09-05 DIAGNOSIS — H52229 Regular astigmatism, unspecified eye: Secondary | ICD-10-CM | POA: Diagnosis not present

## 2013-09-05 DIAGNOSIS — H40039 Anatomical narrow angle, unspecified eye: Secondary | ICD-10-CM | POA: Diagnosis not present

## 2013-09-27 ENCOUNTER — Other Ambulatory Visit (HOSPITAL_COMMUNITY): Payer: Self-pay | Admitting: Internal Medicine

## 2013-09-27 DIAGNOSIS — Z139 Encounter for screening, unspecified: Secondary | ICD-10-CM

## 2013-10-05 ENCOUNTER — Emergency Department (HOSPITAL_COMMUNITY): Payer: Medicare Other

## 2013-10-05 ENCOUNTER — Emergency Department (HOSPITAL_COMMUNITY)
Admission: EM | Admit: 2013-10-05 | Discharge: 2013-10-05 | Disposition: A | Discharge status: Home / Self Care | Payer: Medicare Other | Attending: Emergency Medicine | Admitting: Emergency Medicine

## 2013-10-05 ENCOUNTER — Encounter (HOSPITAL_COMMUNITY): Payer: Self-pay | Admitting: Emergency Medicine

## 2013-10-05 ENCOUNTER — Ambulatory Visit (HOSPITAL_COMMUNITY)
Admit: 2013-10-05 | Discharge: 2013-10-05 | Disposition: A | Discharge status: Home / Self Care | Payer: Medicare Other | Attending: Emergency Medicine | Admitting: Emergency Medicine

## 2013-10-05 DIAGNOSIS — Z862 Personal history of diseases of the blood and blood-forming organs and certain disorders involving the immune mechanism: Secondary | ICD-10-CM | POA: Insufficient documentation

## 2013-10-05 DIAGNOSIS — I672 Cerebral atherosclerosis: Secondary | ICD-10-CM | POA: Diagnosis not present

## 2013-10-05 DIAGNOSIS — I10 Essential (primary) hypertension: Secondary | ICD-10-CM | POA: Diagnosis not present

## 2013-10-05 DIAGNOSIS — Z8639 Personal history of other endocrine, nutritional and metabolic disease: Secondary | ICD-10-CM | POA: Insufficient documentation

## 2013-10-05 DIAGNOSIS — R110 Nausea: Secondary | ICD-10-CM | POA: Insufficient documentation

## 2013-10-05 DIAGNOSIS — R404 Transient alteration of awareness: Secondary | ICD-10-CM | POA: Diagnosis not present

## 2013-10-05 DIAGNOSIS — Z7982 Long term (current) use of aspirin: Secondary | ICD-10-CM | POA: Insufficient documentation

## 2013-10-05 DIAGNOSIS — R42 Dizziness and giddiness: Secondary | ICD-10-CM | POA: Insufficient documentation

## 2013-10-05 DIAGNOSIS — Z79899 Other long term (current) drug therapy: Secondary | ICD-10-CM | POA: Diagnosis not present

## 2013-10-05 DIAGNOSIS — R51 Headache: Secondary | ICD-10-CM | POA: Insufficient documentation

## 2013-10-05 HISTORY — DX: Essential (primary) hypertension: I10

## 2013-10-05 LAB — PROTIME-INR: INR: 1 (ref 0.00–1.49)

## 2013-10-05 LAB — CBC WITH DIFFERENTIAL/PLATELET
Basophils Absolute: 0 10*3/uL (ref 0.0–0.1)
Eosinophils Absolute: 0.1 10*3/uL (ref 0.0–0.7)
Eosinophils Relative: 1 % (ref 0–5)
Lymphocytes Relative: 19 % (ref 12–46)
MCV: 90 fL (ref 78.0–100.0)
Platelets: 237 10*3/uL (ref 150–400)
RDW: 13.2 % (ref 11.5–15.5)
WBC: 7.9 10*3/uL (ref 4.0–10.5)

## 2013-10-05 LAB — COMPREHENSIVE METABOLIC PANEL
ALT: 16 U/L (ref 0–35)
AST: 28 U/L (ref 0–37)
Albumin: 4.2 g/dL (ref 3.5–5.2)
CO2: 29 mEq/L (ref 19–32)
Calcium: 9.6 mg/dL (ref 8.4–10.5)
Sodium: 142 mEq/L (ref 135–145)
Total Protein: 8.3 g/dL (ref 6.0–8.3)

## 2013-10-05 LAB — TROPONIN I: Troponin I: <0.3 ng/mL (ref <0.30)

## 2013-10-05 MED ORDER — ONDANSETRON 8 MG PO TBDP: 8.0000 mg | ORAL_TABLET | Freq: Three times a day (TID) | ORAL | Status: DC | PRN

## 2013-10-05 MED ORDER — DIPHENHYDRAMINE HCL 50 MG/ML IJ SOLN
12.5000 mg | Freq: Once | INTRAMUSCULAR | Status: AC
  Administered 2013-10-05: 12.5 mg via INTRAVENOUS
  Filled 2013-10-05: qty 1

## 2013-10-05 MED ORDER — MECLIZINE HCL 12.5 MG PO TABS
25.0000 mg | ORAL_TABLET | Freq: Once | ORAL | Status: AC
  Administered 2013-10-05: 25 mg via ORAL
  Filled 2013-10-05 (×3): qty 2

## 2013-10-05 MED ORDER — MECLIZINE HCL 12.5 MG PO TABS
25.0000 mg | ORAL_TABLET | Freq: Once | ORAL | Status: AC
  Administered 2013-10-05: 25 mg via ORAL
  Filled 2013-10-05: qty 2

## 2013-10-05 MED ORDER — MECLIZINE HCL 25 MG PO TABS: 25.0000 mg | ORAL_TABLET | Freq: Three times a day (TID) | ORAL | Status: DC | PRN

## 2013-10-05 MED ORDER — METOCLOPRAMIDE HCL 5 MG/ML IJ SOLN
5.0000 mg | Freq: Once | INTRAMUSCULAR | Status: AC
  Administered 2013-10-05: 5 mg via INTRAVENOUS
  Filled 2013-10-05: qty 2

## 2013-10-05 NOTE — ED Provider Notes (Signed)
CSN: [id number]     Arrival date & time 10/05/13  1047 History  This chart was scribed for Ward Givens, MD by Shari Heritage, ED Scribe. The patient was seen in room APA14/APA14. Patient's care was started at 11:35 AM.    Chief Complaint  Patient presents with  . Headache  . Nausea    The history is provided by the patient. No language interpreter was used.    HPI Comments: Lori Edwards is a 79 y.o. female who presents to the Emergency Department complaining of a pounding and throbbing, constant, severe headache onset acutely at 10 am. She localizes her pain from the back of her head/neck and radiates to behind the eyes bilaterally. She denies history of similar headaches. She states that she woke up at about 7 AM and wasn't feeling well. She says that she went back to sleep and woke up around 10:00 and symptoms developed suddenly shortly afterwards. She called EMS and lay on the floor until they arrived. There is associated nausea, blurred vision, and dizziness (which she describes as feeling like the room is spinning and feeling offbalance). She denies vomiting, numbness or tingling of the extremities, chest pain, shortness of breath, or any other symptoms at this time. She has a medical history of thyroid disease and hypertension (and says that blood pressure is well controlled). She lives alone.    PCP - Sherwood Gambler.   Past Medical History  Diagnosis Date  . Thyroid disease   . Hypertension    History reviewed. No pertinent past surgical history. No family history on file. History  Substance Use Topics  . Smoking status: Never Smoker   . Smokeless tobacco: Not on file  . Alcohol Use: No   Lives at home Lives alone  OB History   Grav Para Term Preterm Abortions TAB SAB Ect Mult Living                 Review of Systems  Eyes: Visual disturbance: blurred vision.  Respiratory: Negative for shortness of breath.   Cardiovascular: Negative for chest pain.  Gastrointestinal:  Positive for nausea. Negative for vomiting.  Neurological: Positive for dizziness and headaches. Negative for numbness.  All other systems reviewed and are negative.    Allergies  Review of patient's allergies indicates no known allergies.  Home Medications   Current Outpatient Rx  Name  Route  Sig  Dispense  Refill  . alendronate (FOSAMAX) 70 MG tablet   Oral   Take 70 mg by mouth every 7 (seven) days. Take with a full glass of water on an empty stomach. On Sunday         . amLODipine (NORVASC) 5 MG tablet   Oral   Take 5 mg by mouth daily.         Marland Kitchen aspirin EC 81 MG tablet   Oral   Take 81 mg by mouth daily.         . Multiple Vitamins-Minerals (ONE-A-DAY ENERGY PO)   Oral   Take 1 tablet by mouth daily.         Bertram Gala Glycol-Propyl Glycol (SYSTANE OP)   Ophthalmic   Apply 1 drop to eye daily as needed. Dry Eyes          Triage Vitals: BP 139/55  Pulse 73  Resp 14  Ht 5' (1.524 m)  Wt 125 lb (56.7 kg)  BMI 24.41 kg/m2  SpO2 94%  Vital signs normal    Physical Exam  Nursing note and vitals reviewed. Constitutional: She is oriented to person, place, and time. She appears well-developed and well-nourished.  Non-toxic appearance. She does not appear ill. No distress.  HENT:  Head: Normocephalic and atraumatic.  Right Ear: External ear normal.  Left Ear: External ear normal.  Nose: Nose normal. No mucosal edema or rhinorrhea.  Mouth/Throat: Oropharynx is clear and moist and mucous membranes are normal. No dental abscesses or uvula swelling.  Eyes: Conjunctivae and EOM are normal. Pupils are equal, round, and reactive to light.  No gross visual field cuts. No nystagmus.  Neck: Normal range of motion and full passive range of motion without pain. Neck supple.  Cardiovascular: Normal rate, regular rhythm and normal heart sounds.  Exam reveals no gallop and no friction rub.   No murmur heard. Pulmonary/Chest: Effort normal and breath sounds normal.  No respiratory distress. She has no wheezes. She has no rhonchi. She has no rales. She exhibits no tenderness and no crepitus.  Abdominal: Soft. Normal appearance and bowel sounds are normal. She exhibits no distension. There is no tenderness. There is no rebound and no guarding.  Musculoskeletal: Normal range of motion. She exhibits no edema and no tenderness.  Moves all extremities well.   Neurological: She is alert and oriented to person, place, and time. She has normal strength. No cranial nerve deficit.  Grip is mildly weaker on the left, but she is right handed. No pronator drift. Finger to nose is intact. No motor deficits.  Skin: Skin is warm, dry and intact. No rash noted. No erythema. No pallor.  Psychiatric: She has a normal mood and affect. Her speech is normal and behavior is normal. Her mood appears not anxious.    ED Course  Procedures (including critical care time)  Medications  metoCLOPramide (REGLAN) injection 5 mg (5 mg Intravenous Given 10/05/13 1206)  diphenhydrAMINE (BENADRYL) injection 12.5 mg (12.5 mg Intravenous Given 10/05/13 1207)  meclizine (ANTIVERT) tablet 25 mg (25 mg Oral Given 10/05/13 1206)  meclizine (ANTIVERT) tablet 25 mg (25 mg Oral Given 10/05/13 1505)     DIAGNOSTIC STUDIES: Oxygen Saturation is 94% on room air, adequate by my interpretation.    COORDINATION OF CARE: 11:44 AM- Will order head CT. Patient informed of current plan for treatment and evaluation and agrees with plan at this time.   12:30 Still has headache and dizziness  Recheck 13:55  her headache is gone, still has mild dizziness. We discussed need for MR and she is agreeable to go to [facility] for MR as outpatient and return here if her scan is negative.   14:10 I talked to the MR tech at [facility], she states to send patient down and they will work her in, we discussed keeping her there until the radiologist looks at her MR, if negative she can return to [facility] for discharge, if positive will stay  at [facility] and to go the ED for admission. She wants to be called when she leaves our ED.   14:45 I spoke to Dr Ethelda Chick, [facility] ED, informed patient might come to the ED if her MR scan is positive.   15:30 Dr Patria Mane, my relief,  informed about patient.   Results for orders placed during the hospital encounter of 10/05/13  CBC WITH DIFFERENTIAL      Result Value Range   WBC 7.9  4.0 - 10.5 K/uL   RBC 4.58  3.87 - 5.11 MIL/uL   Hemoglobin 14.3  12.0 - 15.0 g/dL   HCT  41.2  36.0 - 46.0 %   MCV 90.0  78.0 - 100.0 fL   MCH 31.2  26.0 - 34.0 pg   MCHC 34.7  30.0 - 36.0 g/dL   RDW 21.3  08.6 - 57.8 %   Platelets 237  150 - 400 K/uL   Neutrophils Relative % 75  43 - 77 %   Neutro Abs 5.9  1.7 - 7.7 K/uL   Lymphocytes Relative 19  12 - 46 %   Lymphs Abs 1.5  0.7 - 4.0 K/uL   Monocytes Relative 5  3 - 12 %   Monocytes Absolute 0.4  0.1 - 1.0 K/uL   Eosinophils Relative 1  0 - 5 %   Eosinophils Absolute 0.1  0.0 - 0.7 K/uL   Basophils Relative 0  0 - 1 %   Basophils Absolute 0.0  0.0 - 0.1 K/uL  COMPREHENSIVE METABOLIC PANEL      Result Value Range   Sodium 142  135 - 145 mEq/L   Potassium 3.3 (*) 3.5 - 5.1 mEq/L   Chloride 103  96 - 112 mEq/L   CO2 29  19 - 32 mEq/L   Glucose, Bld 107 (*) 70 - 99 mg/dL   BUN 13  6 - 23 mg/dL   Creatinine, Ser 4.69  0.50 - 1.10 mg/dL   Calcium 9.6  8.4 - 62.9 mg/dL   Total Protein 8.3  6.0 - 8.3 g/dL   Albumin 4.2  3.5 - 5.2 g/dL   AST 28  0 - 37 U/L   ALT 16  0 - 35 U/L   Alkaline Phosphatase 58  39 - 117 U/L   Total Bilirubin 0.6  0.3 - 1.2 mg/dL   GFR calc non Af Amer 66 (*) >90 mL/min   GFR calc Af Amer 76 (*) >90 mL/min  APTT      Result Value Range   aPTT 26  24 - 37 seconds  PROTIME-INR      Result Value Range   Prothrombin Time 13.0  11.6 - 15.2 seconds   INR 1.00  0.00 - 1.49  TROPONIN I      Result Value Range   Troponin I <0.30  <0.30 ng/mL   Laboratory interpretation all normal except mild hypokalemia   Imaging Review Ct Head  Wo Contrast  10/05/2013   CLINICAL DATA:  Headache, blurred vision  EXAM: CT HEAD WITHOUT CONTRAST  TECHNIQUE: Contiguous axial images were obtained from the base of the skull through the vertex without intravenous contrast.  COMPARISON:  None  FINDINGS: Negative for acute intracranial hemorrhage, acute infarction, mass, mass effect, hydrocephalus or midline shift. Gray-white differentiation is preserved throughout. Mild cerebral atrophy. Periventricular, subcortical and deep white matter hypoattenuation which is nonspecific but most suggestive of longstanding microvascular ischemic changes. The globes and orbits are intact and symmetric bilaterally. No focal soft tissue abnormality. No calvarial abnormality. Normal aeration of the mastoid air cells and paranasal sinuses. Atherosclerotic calcifications in the bilateral internal carotid arteries.  IMPRESSION: 1. No acute intracranial abnormality. 2. Atrophy and moderate chronic microvascular ischemic white matter disease 3. Intracranial atherosclerosis   Electronically Signed   By: Malachy Moan M.D.   On: 10/05/2013 13:07    EKG Interpretation     Ventricular Rate:  72 PR Interval:  166 QRS Duration: 92 QT Interval:  456 QTC Calculation: 499 R Axis:   56 Text Interpretation:  Normal sinus rhythm Nonspecific ST abnormality Prolonged QT When compared with ECG of  06-Apr-2012 11:59, No significant change was found            MDM  patient presents with acute onset of headache with vertigo symptoms. She does not have a history of either. Her CT scan here today is normal. Her neurological exam is normal. She has no nuchal rigidity. Patient is being sent to [facility] this afternoon for outpatient MRI to rule out posterior stroke and if it is negative she will return for discharge with meclizine if patient has had a stroke she will stay at [facility] came to be admitted for further evaluation.    1. Headache   2. Vertigo    Disposition  pending  I personally performed the services described in this documentation, which was scribed in my presence. The recorded information has been reviewed and considered.   Devoria Albe, MD, Armando Gang    Ward Givens, MD 10/05/13 [phone]

## 2013-10-05 NOTE — ED Notes (Signed)
Patient resting comfortably in bed with eyes closed; arouses easily to verbal stimuli.  A&O; skin w/d. Respirations even and unlabored; equal rise and fall of chest noted.  Patient awaiting decision to go to [facility] for MRI.

## 2013-10-05 NOTE — ED Notes (Signed)
Spoke with Crystal at [facility] ER to give report on patient coming for MRI.

## 2013-10-05 NOTE — ED Notes (Signed)
[facility] here to transport.  Patient states dizziness and headache are gone now; patient rates pain as 0/10.

## 2013-10-05 NOTE — ED Provider Notes (Signed)
8:07 PM Pt feels much better. Sounds like peripheral vertigo. Home with zofran and meclizine. Ambulatory in hall. MRI without acute pathology.   1. Headache   2. Vertigo     Ct Head Wo Contrast  10/05/2013   CLINICAL DATA:  Headache, blurred vision  EXAM: CT HEAD WITHOUT CONTRAST  TECHNIQUE: Contiguous axial images were obtained from the base of the skull through the vertex without intravenous contrast.  COMPARISON:  None  FINDINGS: Negative for acute intracranial hemorrhage, acute infarction, mass, mass effect, hydrocephalus or midline shift. Gray-white differentiation is preserved throughout. Mild cerebral atrophy. Periventricular, subcortical and deep white matter hypoattenuation which is nonspecific but most suggestive of longstanding microvascular ischemic changes. The globes and orbits are intact and symmetric bilaterally. No focal soft tissue abnormality. No calvarial abnormality. Normal aeration of the mastoid air cells and paranasal sinuses. Atherosclerotic calcifications in the bilateral internal carotid arteries.  IMPRESSION: 1. No acute intracranial abnormality. 2. Atrophy and moderate chronic microvascular ischemic white matter disease 3. Intracranial atherosclerosis   Electronically Signed   By: Malachy Moan M.D.   On: 10/05/2013 13:07   [name]  10/05/2013   CLINICAL DATA:  79 year old female with acute onset headache, blurred vision, and dizziness. Possible posterior circulation infarct. Initial encounter.  EXAM: MRI HEAD WITHOUT CONTRAST  TECHNIQUE: Multiplanar, multisequence MR imaging was performed. No intravenous contrast was administered.  COMPARISON:  Head CT without contrast 10/05/2013.  FINDINGS: Normal cerebral volume. No restricted diffusion to suggest acute infarction. No midline shift, mass effect, evidence of mass lesion, ventriculomegaly, extra-axial collection or acute intracranial hemorrhage. Cervicomedullary junction and pituitary are within normal  limits.  Degenerative changes in the visible cervical spine, chronic disc and endplate degeneration at C3-C4 and C4-C5.  Major intracranial vascular flow voids are preserved.  Overall mild for age scattered cerebral white matter T2 and FLAIR hyperintensity, mostly subcortical. No cortical encephalomalacia. Normal deep gray matter nuclei, brainstem, and cerebellum. Visible internal auditory structures appear normal. Mastoids are clear.  Visualized orbit soft tissues are within normal limits. Minor paranasal sinus mucosal thickening. Visualized scalp soft tissues are within normal limits. Normal bone marrow signal. Appearance of the bilateral parotid glands may indicate previous sialoadenitis.  IMPRESSION: No acute intracranial abnormality.  Largely unremarkable for age non contrast MRI appearance of the brain, with mild nonspecific white matter signal changes.   Electronically Signed   By: Augusto Gamble M.D.   On: 10/05/2013 18:27  I personally reviewed the imaging tests through PACS system I reviewed available ER/hospitalization records through the EMR   Lyanne Co, MD 10/05/13 2008

## 2013-10-05 NOTE — ED Notes (Signed)
EMS - Pt c/o of headache and nausea.  Pt states she woke at 7:00am with dizziness went back to sleep and woke again at 10:00am with the same dizziness and nausea.  She called EMS and layed in the kitchen floor.   Pt states she has a headache with pain 10 of 10 and nausea.  EMS gave 4mg  of Zofran to pt prior to arrival.  Pt placed on 2L O2 with 90% on room air.

## 2013-10-05 NOTE — ED Notes (Signed)
Report given for Lori Edwards with [facility].  Patient ambulatory to bathroom.

## 2013-10-05 NOTE — ED Notes (Signed)
Merlyn Lot, neighbor to patient, contacted regarding discharge; states she will be here in a few minutes.

## 2013-10-05 NOTE — ED Notes (Signed)
Lori Edwards [phone] and H: [phone] (Husband Birdena Crandall: [phone])  Neighbors to [name]

## 2013-10-05 NOTE — ED Notes (Signed)
Spoke with Wes at [facility] at this time about transport of [facility] for mri. Margert Edsall

## 2013-10-07 ENCOUNTER — Ambulatory Visit (HOSPITAL_COMMUNITY)
Admission: RE | Admit: 2013-10-07 | Discharge: 2013-10-07 | Disposition: A | Discharge status: Home / Self Care | Payer: Medicare Other | Source: Ambulatory Visit | Attending: Internal Medicine | Admitting: Internal Medicine

## 2013-10-07 DIAGNOSIS — Z139 Encounter for screening, unspecified: Secondary | ICD-10-CM

## 2013-10-07 DIAGNOSIS — Z1231 Encounter for screening mammogram for malignant neoplasm of breast: Secondary | ICD-10-CM | POA: Diagnosis not present

## 2014-03-27 ENCOUNTER — Other Ambulatory Visit (HOSPITAL_COMMUNITY): Payer: Self-pay | Admitting: Internal Medicine

## 2014-03-27 DIAGNOSIS — M810 Age-related osteoporosis without current pathological fracture: Secondary | ICD-10-CM | POA: Diagnosis not present

## 2014-03-27 DIAGNOSIS — I10 Essential (primary) hypertension: Secondary | ICD-10-CM | POA: Diagnosis not present

## 2014-03-27 DIAGNOSIS — IMO0002 Reserved for concepts with insufficient information to code with codable children: Secondary | ICD-10-CM | POA: Diagnosis not present

## 2014-04-03 ENCOUNTER — Ambulatory Visit (HOSPITAL_COMMUNITY)
Admission: RE | Admit: 2014-04-03 | Discharge: 2014-04-03 | Disposition: A | Discharge status: Home / Self Care | Payer: Medicare Other | Source: Ambulatory Visit | Attending: Internal Medicine | Admitting: Internal Medicine

## 2014-04-03 DIAGNOSIS — M810 Age-related osteoporosis without current pathological fracture: Secondary | ICD-10-CM | POA: Diagnosis not present

## 2014-04-03 DIAGNOSIS — M899 Disorder of bone, unspecified: Secondary | ICD-10-CM | POA: Diagnosis not present

## 2014-04-03 DIAGNOSIS — Z780 Asymptomatic menopausal state: Secondary | ICD-10-CM | POA: Diagnosis not present

## 2014-04-03 DIAGNOSIS — M949 Disorder of cartilage, unspecified: Secondary | ICD-10-CM | POA: Diagnosis not present

## 2014-09-15 DIAGNOSIS — I10 Essential (primary) hypertension: Secondary | ICD-10-CM | POA: Diagnosis not present

## 2014-09-15 DIAGNOSIS — Z6825 Body mass index (BMI) 25.0-25.9, adult: Secondary | ICD-10-CM | POA: Diagnosis not present

## 2014-09-15 DIAGNOSIS — Z713 Dietary counseling and surveillance: Secondary | ICD-10-CM | POA: Diagnosis not present

## 2014-09-24 ENCOUNTER — Other Ambulatory Visit (HOSPITAL_COMMUNITY): Payer: Self-pay | Admitting: Internal Medicine

## 2014-09-24 DIAGNOSIS — Z1231 Encounter for screening mammogram for malignant neoplasm of breast: Secondary | ICD-10-CM

## 2014-10-09 ENCOUNTER — Ambulatory Visit (HOSPITAL_COMMUNITY)
Admission: RE | Admit: 2014-10-09 | Discharge: 2014-10-09 | Disposition: A | Discharge status: Home / Self Care | Payer: Medicare Other | Source: Ambulatory Visit | Attending: Internal Medicine | Admitting: Internal Medicine

## 2014-10-09 DIAGNOSIS — Z1231 Encounter for screening mammogram for malignant neoplasm of breast: Secondary | ICD-10-CM | POA: Insufficient documentation

## 2015-02-26 DIAGNOSIS — H25819 Combined forms of age-related cataract, unspecified eye: Secondary | ICD-10-CM | POA: Diagnosis not present

## 2015-02-26 DIAGNOSIS — H40009 Preglaucoma, unspecified, unspecified eye: Secondary | ICD-10-CM | POA: Diagnosis not present

## 2015-02-26 DIAGNOSIS — H40019 Open angle with borderline findings, low risk, unspecified eye: Secondary | ICD-10-CM | POA: Diagnosis not present

## 2015-02-26 DIAGNOSIS — H40053 Ocular hypertension, bilateral: Secondary | ICD-10-CM | POA: Diagnosis not present

## 2015-03-17 DIAGNOSIS — L821 Other seborrheic keratosis: Secondary | ICD-10-CM | POA: Diagnosis not present

## 2015-03-17 DIAGNOSIS — L259 Unspecified contact dermatitis, unspecified cause: Secondary | ICD-10-CM | POA: Diagnosis not present

## 2015-05-22 DIAGNOSIS — Z6825 Body mass index (BMI) 25.0-25.9, adult: Secondary | ICD-10-CM | POA: Diagnosis not present

## 2015-05-22 DIAGNOSIS — I10 Essential (primary) hypertension: Secondary | ICD-10-CM | POA: Diagnosis not present

## 2015-05-22 DIAGNOSIS — D518 Other vitamin B12 deficiency anemias: Secondary | ICD-10-CM | POA: Diagnosis not present

## 2015-05-22 DIAGNOSIS — M810 Age-related osteoporosis without current pathological fracture: Secondary | ICD-10-CM | POA: Diagnosis not present

## 2015-05-22 DIAGNOSIS — E663 Overweight: Secondary | ICD-10-CM | POA: Diagnosis not present

## 2015-12-21 DIAGNOSIS — H40053 Ocular hypertension, bilateral: Secondary | ICD-10-CM | POA: Diagnosis not present

## 2015-12-21 DIAGNOSIS — H40013 Open angle with borderline findings, low risk, bilateral: Secondary | ICD-10-CM | POA: Diagnosis not present

## 2015-12-21 DIAGNOSIS — H40003 Preglaucoma, unspecified, bilateral: Secondary | ICD-10-CM | POA: Diagnosis not present

## 2015-12-21 DIAGNOSIS — H25813 Combined forms of age-related cataract, bilateral: Secondary | ICD-10-CM | POA: Diagnosis not present

## 2016-02-22 DIAGNOSIS — H25093 Other age-related incipient cataract, bilateral: Secondary | ICD-10-CM | POA: Diagnosis not present

## 2016-02-22 DIAGNOSIS — H52223 Regular astigmatism, bilateral: Secondary | ICD-10-CM | POA: Diagnosis not present

## 2016-02-22 DIAGNOSIS — H5203 Hypermetropia, bilateral: Secondary | ICD-10-CM | POA: Diagnosis not present

## 2016-02-22 DIAGNOSIS — H40013 Open angle with borderline findings, low risk, bilateral: Secondary | ICD-10-CM | POA: Diagnosis not present

## 2016-03-23 DIAGNOSIS — Z6826 Body mass index (BMI) 26.0-26.9, adult: Secondary | ICD-10-CM | POA: Diagnosis not present

## 2016-03-23 DIAGNOSIS — Z0000 Encounter for general adult medical examination without abnormal findings: Secondary | ICD-10-CM | POA: Diagnosis not present

## 2016-03-23 DIAGNOSIS — Z1389 Encounter for screening for other disorder: Secondary | ICD-10-CM | POA: Diagnosis not present

## 2016-05-12 DIAGNOSIS — Z23 Encounter for immunization: Secondary | ICD-10-CM | POA: Diagnosis not present

## 2016-08-25 ENCOUNTER — Ambulatory Visit (INDEPENDENT_AMBULATORY_CARE_PROVIDER_SITE_OTHER): Payer: Medicare Other | Admitting: Family Medicine

## 2016-08-25 ENCOUNTER — Encounter: Payer: Self-pay | Admitting: Family Medicine

## 2016-08-25 VITALS — BP 122/76 | HR 72 | Temp 98.2°F | Wt 124.6 lb

## 2016-08-25 DIAGNOSIS — I10 Essential (primary) hypertension: Secondary | ICD-10-CM | POA: Diagnosis not present

## 2016-08-25 DIAGNOSIS — M810 Age-related osteoporosis without current pathological fracture: Secondary | ICD-10-CM | POA: Diagnosis not present

## 2016-08-25 DIAGNOSIS — Z23 Encounter for immunization: Secondary | ICD-10-CM | POA: Diagnosis not present

## 2016-08-25 DIAGNOSIS — E785 Hyperlipidemia, unspecified: Secondary | ICD-10-CM | POA: Insufficient documentation

## 2016-08-25 LAB — COMPREHENSIVE METABOLIC PANEL
ALT: 23 U/L (ref 0–35)
AST: 25 U/L (ref 0–37)
Albumin: 4.4 g/dL (ref 3.5–5.2)
Alkaline Phosphatase: 63 U/L (ref 39–117)
BUN: 16 mg/dL (ref 6–23)
CHLORIDE: 103 meq/L (ref 96–112)
CO2: 31 meq/L (ref 19–32)
CREATININE: 0.85 mg/dL (ref 0.40–1.20)
Calcium: 9.6 mg/dL (ref 8.4–10.5)
GFR: 67.47 mL/min (ref >60.00)
Glucose, Bld: 106 mg/dL — ABNORMAL HIGH (ref 70–99)
Potassium: 4.2 mEq/L (ref 3.5–5.1)
Sodium: 139 mEq/L (ref 135–145)
Total Bilirubin: 0.5 mg/dL (ref 0.2–1.2)
Total Protein: 8 g/dL (ref 6.0–8.3)

## 2016-08-25 MED ORDER — AMLODIPINE BESYLATE 5 MG PO TABS: 5.0000 mg | ORAL_TABLET | Freq: Every day | ORAL | 3 refills | Status: DC

## 2016-08-25 MED ORDER — ALENDRONATE SODIUM 70 MG PO TABS: 70.0000 mg | ORAL_TABLET | ORAL | 3 refills | Status: DC

## 2016-08-25 NOTE — Progress Notes (Signed)
Pre visit review using our clinic review tool, if applicable. No additional management support is needed unless otherwise documented below in the visit note. 

## 2016-08-25 NOTE — Assessment & Plan Note (Addendum)
Continue Fosamax at this time. Fosamax refill today. We will check a DEXA scan.

## 2016-08-25 NOTE — Assessment & Plan Note (Signed)
Not currently on a statin given age. Currently taking red yeast rice. She'll continue this and continue to exercise.

## 2016-08-25 NOTE — Assessment & Plan Note (Addendum)
At goal. Continue amlodipine. Amlodipine refilled today. Check CMP today.

## 2016-08-25 NOTE — Patient Instructions (Signed)
Nice to meet you. Please continue current medications. We will obtain some lab work today and call you with the results. I have ordered a bone density scan as well as it is been greater than 2 years since her last test. Someone will call you to set this up.

## 2016-08-25 NOTE — Progress Notes (Signed)
Tommi Rumps, MD Phone: [phone]  Lori Edwards is a 82 y.o. female who presents today for new patient visit.  HYPERTENSION Disease Monitoring: Blood pressure range-140s over 70s Chest pain, palpitations- no      Dyspnea- no Medications: Compliance- taking amlodipine Lightheadedness,Syncope- no   Edema- no  HYPERLIPIDEMIA Disease Monitoring: See symptoms for Hypertension Medications: Compliance- taking red yeast rice Has not been on a statin in a number of years. Has not had her cholesterol checked in a number of years due to her age. Does zumba and walks and dances for exercise.  Osteoporosis: Has been on Fosamax for greater than 10 years. Notes her bone density has remained stable while on this. Last bone density test was just over 2 years ago. No GERD or esophageal issues with Fosamax.    Active Ambulatory Problems    Diagnosis Date Noted  . Essential hypertension 08/25/2016  . Hyperlipidemia 08/25/2016  . Osteoporosis 08/25/2016   Resolved Ambulatory Problems    Diagnosis Date Noted  . No Resolved Ambulatory Problems   Past Medical History:  Diagnosis Date  . Hyperlipidemia   . Hypertension   . Thyroid disease     Family History  Problem Relation Age of Onset  . Stroke Other     Parent    Social History   Social History  . Marital status: Widowed    Spouse name: N/A  . Number of children: N/A  . Years of education: N/A   Occupational History  . Not on file.   Social History Main Topics  . Smoking status: Never Smoker  . Smokeless tobacco: Not on file  . Alcohol use No  . Drug use: No  . Sexual activity: Not on file   Other Topics Concern  . Not on file   Social History Narrative  . No narrative on file    ROS  General:  Negative for nexplained weight loss, fever Skin: Negative for new or changing mole, sore that won't heal HEENT: Negative for trouble hearing, trouble seeing, ringing in ears, mouth sores, hoarseness, change in  voice, dysphagia. CV:  Negative for chest pain, dyspnea, edema, palpitations Resp: Negative for cough, dyspnea, hemoptysis GI: Negative for nausea, vomiting, diarrhea, constipation, abdominal pain, melena, hematochezia. GU: Negative for dysuria, incontinence, urinary hesitance, hematuria, vaginal or penile discharge, polyuria, sexual difficulty, lumps in testicle or breasts MSK: Negative for muscle cramps or aches, joint pain or swelling Neuro: Negative for headaches, weakness, numbness, dizziness, passing out/fainting Psych: Negative for depression, anxiety, memory problems  Objective  Physical Exam Vitals:   08/25/16 1327  BP: 122/76  Pulse: 72  Temp: 98.2 F (36.8 C)    BP Readings from Last 3 Encounters:  08/25/16 122/76  10/05/13 135/61  04/09/12 135/65   Wt Readings from Last 3 Encounters:  08/25/16 124 lb 9.6 oz (56.5 kg)  10/05/13 125 lb (56.7 kg)  04/09/12 120 lb (54.4 kg)    Physical Exam  Constitutional: No distress.  HENT:  Head: Normocephalic and atraumatic.  Mouth/Throat: Oropharynx is clear and moist.  Eyes: Conjunctivae are normal. Pupils are equal, round, and reactive to light.  Cardiovascular: Normal rate, regular rhythm and normal heart sounds.   Pulmonary/Chest: Effort normal and breath sounds normal.  Abdominal: Soft. Bowel sounds are normal. She exhibits no distension. There is no tenderness.  Musculoskeletal: She exhibits no edema.  Neurological: She is alert. Gait normal.  Skin: Skin is warm and dry. She is not diaphoretic.  Psychiatric: Mood and affect  normal.     Assessment/Plan:   Essential hypertension At goal. Continue amlodipine. Amlodipine refilled today. Check CMP today.  Hyperlipidemia Not currently on a statin given age. Currently taking red yeast rice. She'll continue this and continue to exercise.  Osteoporosis Continue Fosamax at this time. Fosamax refill today. We will check a DEXA scan.   Orders Placed This Encounter    Procedures  . DG Bone Density    Standing Status:   Future    Standing Expiration Date:   10/25/2017    Order Specific Question:   Reason for Exam (SYMPTOM  OR DIAGNOSIS REQUIRED)    Answer:   osteoporosis    Order Specific Question:   Preferred imaging location?    Answer:   [facility]  . Flu vaccine HIGH DOSE PF  . Comp Met (CMET)    Meds ordered this encounter  Medications  . alendronate (FOSAMAX) 70 MG tablet    Sig: Take 1 tablet (70 mg total) by mouth every 7 (seven) days. Take with a full glass of water on an empty stomach. On Sunday    Dispense:  12 tablet    Refill:  3  . amLODipine (NORVASC) 5 MG tablet    Sig: Take 1 tablet (5 mg total) by mouth daily.    Dispense:  90 tablet    Refill:  [place], MD [place]

## 2016-09-22 ENCOUNTER — Encounter: Payer: Self-pay | Admitting: Family Medicine

## 2016-10-19 ENCOUNTER — Ambulatory Visit
Admission: RE | Admit: 2016-10-19 | Discharge: 2016-10-19 | Disposition: A | Discharge status: Home / Self Care | Payer: Medicare Other | Source: Ambulatory Visit | Attending: Family Medicine | Admitting: Family Medicine

## 2016-10-19 DIAGNOSIS — M85852 Other specified disorders of bone density and structure, left thigh: Secondary | ICD-10-CM | POA: Diagnosis not present

## 2016-10-19 DIAGNOSIS — M8588 Other specified disorders of bone density and structure, other site: Secondary | ICD-10-CM | POA: Diagnosis not present

## 2016-10-19 DIAGNOSIS — M810 Age-related osteoporosis without current pathological fracture: Secondary | ICD-10-CM | POA: Diagnosis not present

## 2016-10-24 ENCOUNTER — Telehealth: Payer: Self-pay | Admitting: Family Medicine

## 2016-10-24 NOTE — Telephone Encounter (Signed)
Patient called and advised of results

## 2016-10-24 NOTE — Telephone Encounter (Signed)
Pt called back returning your call. Pt stated that she will be in and out, if she is not home she asked that you leave the results on the machine. Thank you!  Call pt @ [phone]

## 2017-02-22 ENCOUNTER — Encounter: Payer: Self-pay | Admitting: Family Medicine

## 2017-02-22 ENCOUNTER — Ambulatory Visit (INDEPENDENT_AMBULATORY_CARE_PROVIDER_SITE_OTHER): Payer: Medicare Other | Admitting: Family Medicine

## 2017-02-22 DIAGNOSIS — I10 Essential (primary) hypertension: Secondary | ICD-10-CM | POA: Diagnosis not present

## 2017-02-22 DIAGNOSIS — M810 Age-related osteoporosis without current pathological fracture: Secondary | ICD-10-CM | POA: Diagnosis not present

## 2017-02-22 DIAGNOSIS — E663 Overweight: Secondary | ICD-10-CM | POA: Insufficient documentation

## 2017-02-22 NOTE — Assessment & Plan Note (Signed)
Has gained some weight and is now on the overweight range. Discussed staying active and watching her diet.

## 2017-02-22 NOTE — Assessment & Plan Note (Signed)
Has been on Fosamax for a long time. We will give her a holiday given her osteopenia on recent DEXA scan. Consider reevaluation in 1-2 years.

## 2017-02-22 NOTE — Patient Instructions (Signed)
Nice to see you. Please stop your Fosamax. You should continue your vitamin supplement. You should remain active.

## 2017-02-22 NOTE — Assessment & Plan Note (Signed)
Well controlled. Continue Norvasc. 

## 2017-02-22 NOTE — Progress Notes (Signed)
Pre visit review using our clinic review tool, if applicable. No additional management support is needed unless otherwise documented below in the visit note. 

## 2017-02-22 NOTE — Progress Notes (Signed)
  Marikay AlarEric Ginnie Marich, MD Phone: [phone]  Lori Edwards is a 83 y.o. female who presents today for f/u.  HYPERTENSION  Disease Monitoring  Home BP Monitoring 118/65 Chest pain- no    Dyspnea- no Medications  Compliance-  Taking norvasc.   Edema- no  Osteoporosis: Currently taking Fosamax. Reports she's been on this for 20+ years. She takes a multivitamin. Most recent DEXA scan in the osteopenic range. She notes this is not changed in the long time. No fractures.  Overweight: She notes her weight is up some. She's been eating more carbs and a larger quantity than usual. She does eat an otherwise healthy diet. She does exercise by doing zumba and playing corn hole   PMH: nonsmoker.   ROS see history of present illness  Objective  Physical Exam Vitals:   02/22/17 1001  BP: 138/82  Pulse: 62  Temp: 97.7 F (36.5 C)    BP Readings from Last 3 Encounters:  02/22/17 138/82  08/25/16 122/76  10/05/13 135/61   Wt Readings from Last 3 Encounters:  02/22/17 132 lb 3.2 oz (60 kg)  08/25/16 124 lb 9.6 oz (56.5 kg)  10/05/13 125 lb (56.7 kg)    Physical Exam  Constitutional: No distress.  Cardiovascular: Normal rate, regular rhythm and normal heart sounds.   Pulmonary/Chest: Effort normal and breath sounds normal.  Neurological: She is alert. Gait normal.  Skin: Skin is warm and dry. She is not diaphoretic.     Assessment/Plan: Please see individual problem list.  Essential hypertension Well-controlled. Continue Norvasc.  Osteoporosis Has been on Fosamax for a long time. We will give her a holiday given her osteopenia on recent DEXA scan. Consider reevaluation in 1-2 years.  Overweight (BMI 25.0-29.9) Has gained some weight and is now on the overweight range. Discussed staying active and watching her diet.   Marikay AlarEric Milind Raether, MD [facility]

## 2017-04-03 DIAGNOSIS — H2513 Age-related nuclear cataract, bilateral: Secondary | ICD-10-CM | POA: Diagnosis not present

## 2017-07-24 DIAGNOSIS — L821 Other seborrheic keratosis: Secondary | ICD-10-CM | POA: Diagnosis not present

## 2017-07-24 DIAGNOSIS — L718 Other rosacea: Secondary | ICD-10-CM | POA: Diagnosis not present

## 2017-07-25 ENCOUNTER — Telehealth: Payer: Self-pay | Admitting: Family Medicine

## 2017-07-25 NOTE — Telephone Encounter (Signed)
Left pt message asking to call Allison back directly at [phone] to schedule AWV. Thanks! ° °*NOTE* Never had AWV before °

## 2017-07-27 NOTE — Telephone Encounter (Signed)
Scheduled 07/31/17 °

## 2017-07-31 ENCOUNTER — Emergency Department: Payer: Medicare Other

## 2017-07-31 ENCOUNTER — Ambulatory Visit: Payer: Medicare Other

## 2017-07-31 ENCOUNTER — Encounter: Payer: Self-pay | Admitting: Emergency Medicine

## 2017-07-31 ENCOUNTER — Encounter
Admission: EM | Disposition: A | Discharge status: Home / Self Care | Payer: Self-pay | Source: Home / Self Care | Attending: Emergency Medicine

## 2017-07-31 ENCOUNTER — Ambulatory Visit
Admission: EM | Admit: 2017-07-31 | Discharge: 2017-08-02 | Disposition: A | Discharge status: Home / Self Care | Payer: Medicare Other | Attending: Specialist | Admitting: Specialist

## 2017-07-31 ENCOUNTER — Emergency Department: Payer: Medicare Other | Admitting: Certified Registered Nurse Anesthetist

## 2017-07-31 DIAGNOSIS — I10 Essential (primary) hypertension: Secondary | ICD-10-CM | POA: Insufficient documentation

## 2017-07-31 DIAGNOSIS — M1990 Unspecified osteoarthritis, unspecified site: Secondary | ICD-10-CM | POA: Insufficient documentation

## 2017-07-31 DIAGNOSIS — D72829 Elevated white blood cell count, unspecified: Secondary | ICD-10-CM | POA: Diagnosis not present

## 2017-07-31 DIAGNOSIS — E785 Hyperlipidemia, unspecified: Secondary | ICD-10-CM | POA: Insufficient documentation

## 2017-07-31 DIAGNOSIS — S52614A Nondisplaced fracture of right ulna styloid process, initial encounter for closed fracture: Secondary | ICD-10-CM

## 2017-07-31 DIAGNOSIS — R2681 Unsteadiness on feet: Secondary | ICD-10-CM | POA: Diagnosis not present

## 2017-07-31 DIAGNOSIS — S52591A Other fractures of lower end of right radius, initial encounter for closed fracture: Secondary | ICD-10-CM | POA: Insufficient documentation

## 2017-07-31 DIAGNOSIS — Y9389 Activity, other specified: Secondary | ICD-10-CM | POA: Diagnosis not present

## 2017-07-31 DIAGNOSIS — S80211A Abrasion, right knee, initial encounter: Secondary | ICD-10-CM | POA: Insufficient documentation

## 2017-07-31 DIAGNOSIS — Z01810 Encounter for preprocedural cardiovascular examination: Secondary | ICD-10-CM | POA: Diagnosis not present

## 2017-07-31 DIAGNOSIS — W19XXXA Unspecified fall, initial encounter: Secondary | ICD-10-CM

## 2017-07-31 DIAGNOSIS — Z823 Family history of stroke: Secondary | ICD-10-CM | POA: Insufficient documentation

## 2017-07-31 DIAGNOSIS — W010XXA Fall on same level from slipping, tripping and stumbling without subsequent striking against object, initial encounter: Secondary | ICD-10-CM | POA: Diagnosis not present

## 2017-07-31 DIAGNOSIS — S62101A Fracture of unspecified carpal bone, right wrist, initial encounter for closed fracture: Secondary | ICD-10-CM | POA: Diagnosis present

## 2017-07-31 DIAGNOSIS — S5291XA Unspecified fracture of right forearm, initial encounter for closed fracture: Secondary | ICD-10-CM | POA: Diagnosis not present

## 2017-07-31 DIAGNOSIS — I700 Atherosclerosis of aorta: Secondary | ICD-10-CM | POA: Insufficient documentation

## 2017-07-31 DIAGNOSIS — Z7982 Long term (current) use of aspirin: Secondary | ICD-10-CM | POA: Diagnosis not present

## 2017-07-31 DIAGNOSIS — Z79899 Other long term (current) drug therapy: Secondary | ICD-10-CM | POA: Insufficient documentation

## 2017-07-31 DIAGNOSIS — S52601A Unspecified fracture of lower end of right ulna, initial encounter for closed fracture: Secondary | ICD-10-CM

## 2017-07-31 DIAGNOSIS — E039 Hypothyroidism, unspecified: Secondary | ICD-10-CM | POA: Insufficient documentation

## 2017-07-31 DIAGNOSIS — Z6825 Body mass index (BMI) 25.0-25.9, adult: Secondary | ICD-10-CM | POA: Insufficient documentation

## 2017-07-31 DIAGNOSIS — Z01818 Encounter for other preprocedural examination: Secondary | ICD-10-CM | POA: Diagnosis not present

## 2017-07-31 DIAGNOSIS — E663 Overweight: Secondary | ICD-10-CM | POA: Insufficient documentation

## 2017-07-31 DIAGNOSIS — S52501A Unspecified fracture of the lower end of right radius, initial encounter for closed fracture: Secondary | ICD-10-CM | POA: Diagnosis present

## 2017-07-31 DIAGNOSIS — Y929 Unspecified place or not applicable: Secondary | ICD-10-CM | POA: Insufficient documentation

## 2017-07-31 DIAGNOSIS — S52531A Colles' fracture of right radius, initial encounter for closed fracture: Secondary | ICD-10-CM | POA: Diagnosis not present

## 2017-07-31 HISTORY — PX: OPEN REDUCTION INTERNAL FIXATION (ORIF) DISTAL RADIAL FRACTURE: SHX5989

## 2017-07-31 LAB — COMPREHENSIVE METABOLIC PANEL
ALK PHOS: 60 U/L (ref 38–126)
ALT: 28 U/L (ref 14–54)
AST: 34 U/L (ref 15–41)
Albumin: 4.8 g/dL (ref 3.5–5.0)
Anion gap: 10 (ref 5–15)
BUN: 17 mg/dL (ref 6–20)
CALCIUM: 9.9 mg/dL (ref 8.9–10.3)
CO2: 25 mmol/L (ref 22–32)
CREATININE: 0.98 mg/dL (ref 0.44–1.00)
Chloride: 103 mmol/L (ref 101–111)
GFR, EST AFRICAN AMERICAN: 59 mL/min — AB (ref >60)
GFR, EST NON AFRICAN AMERICAN: 51 mL/min — AB (ref >60)
Glucose, Bld: 117 mg/dL — ABNORMAL HIGH (ref 65–99)
Potassium: 3.8 mmol/L (ref 3.5–5.1)
Sodium: 138 mmol/L (ref 135–145)
TOTAL PROTEIN: 8.9 g/dL — AB (ref 6.5–8.1)
Total Bilirubin: 0.8 mg/dL (ref 0.3–1.2)

## 2017-07-31 LAB — CBC WITH DIFFERENTIAL/PLATELET
BASOS ABS: 0 10*3/uL (ref 0–0.1)
Basophils Relative: 0 %
EOS ABS: 0 10*3/uL (ref 0–0.7)
Eosinophils Relative: 0 %
HCT: 43.6 % (ref 35.0–47.0)
HEMOGLOBIN: 14.8 g/dL (ref 12.0–16.0)
Lymphocytes Relative: 6 %
Lymphs Abs: 0.9 10*3/uL — ABNORMAL LOW (ref 1.0–3.6)
MCH: 30.8 pg (ref 26.0–34.0)
MCHC: 33.8 g/dL (ref 32.0–36.0)
MCV: 91 fL (ref 80.0–100.0)
Monocytes Absolute: 0.4 10*3/uL (ref 0.2–0.9)
Monocytes Relative: 3 %
NEUTROS PCT: 91 %
Neutro Abs: 13.6 10*3/uL — ABNORMAL HIGH (ref 1.4–6.5)
Platelets: 267 10*3/uL (ref 150–440)
RBC: 4.8 MIL/uL (ref 3.80–5.20)
RDW: 13.4 % (ref 11.5–14.5)
WBC: 14.9 10*3/uL — AB (ref 3.6–11.0)

## 2017-07-31 LAB — URINALYSIS, COMPLETE (UACMP) WITH MICROSCOPIC
Bacteria, UA: NONE SEEN
Bilirubin Urine: NEGATIVE
Glucose, UA: NEGATIVE mg/dL
HGB URINE DIPSTICK: NEGATIVE
Ketones, ur: 5 mg/dL — AB
Leukocytes, UA: NEGATIVE
NITRITE: NEGATIVE
Protein, ur: NEGATIVE mg/dL
SPECIFIC GRAVITY, URINE: 1.009 (ref 1.005–1.030)
Squamous Epithelial / LPF: NONE SEEN
pH: 7 (ref 5.0–8.0)

## 2017-07-31 LAB — PROTIME-INR
INR: 0.93
PROTHROMBIN TIME: 12.5 s (ref 11.4–15.2)

## 2017-07-31 SURGERY — OPEN REDUCTION INTERNAL FIXATION (ORIF) DISTAL RADIUS FRACTURE
Anesthesia: General | Site: Wrist | Laterality: Right | Wound class: Clean

## 2017-07-31 MED ORDER — SODIUM CHLORIDE 0.9 % IV SOLN
INTRAVENOUS | Status: DC
  Administered 2017-07-31: 15:00:00 via INTRAVENOUS

## 2017-07-31 MED ORDER — ONDANSETRON HCL 4 MG PO TABS: 4.0000 mg | ORAL_TABLET | Freq: Four times a day (QID) | ORAL | Status: DC | PRN

## 2017-07-31 MED ORDER — MAGNESIUM HYDROXIDE 400 MG/5ML PO SUSP: 30.0000 mL | Freq: Every day | ORAL | Status: DC | PRN

## 2017-07-31 MED ORDER — METOCLOPRAMIDE HCL 5 MG/ML IJ SOLN
5.0000 mg | Freq: Three times a day (TID) | INTRAMUSCULAR | Status: DC | PRN
Start: 2017-07-31 — End: 2017-08-02
  Administered 2017-08-01: 10 mg via INTRAVENOUS
  Filled 2017-07-31: qty 2

## 2017-07-31 MED ORDER — ONDANSETRON HCL 4 MG/2ML IJ SOLN
INTRAMUSCULAR | Status: DC | PRN
Start: 2017-07-31 — End: 2017-07-31
  Administered 2017-07-31: 4 mg via INTRAVENOUS

## 2017-07-31 MED ORDER — ONDANSETRON HCL 4 MG/2ML IJ SOLN
4.0000 mg | Freq: Four times a day (QID) | INTRAMUSCULAR | Status: DC | PRN
  Administered 2017-07-31 – 2017-08-01 (×3): 4 mg via INTRAVENOUS
  Filled 2017-07-31 (×3): qty 2

## 2017-07-31 MED ORDER — METHOCARBAMOL 1000 MG/10ML IJ SOLN
500.0000 mg | Freq: Four times a day (QID) | INTRAVENOUS | Status: DC | PRN
  Filled 2017-07-31: qty 5

## 2017-07-31 MED ORDER — PROPOFOL 10 MG/ML IV BOLUS
INTRAVENOUS | Status: DC | PRN
  Administered 2017-07-31: 100 mg via INTRAVENOUS

## 2017-07-31 MED ORDER — ONDANSETRON HCL 4 MG/2ML IJ SOLN
INTRAMUSCULAR | Status: AC
  Filled 2017-07-31: qty 2

## 2017-07-31 MED ORDER — NEOMYCIN-POLYMYXIN B GU 40-200000 IR SOLN
Status: DC | PRN
  Administered 2017-07-31: 2 mL

## 2017-07-31 MED ORDER — ASPIRIN EC 81 MG PO TBEC
81.0000 mg | DELAYED_RELEASE_TABLET | Freq: Every day | ORAL | Status: DC
  Administered 2017-08-01 – 2017-08-02 (×2): 81 mg via ORAL
  Filled 2017-07-31 (×2): qty 1

## 2017-07-31 MED ORDER — DEXAMETHASONE SODIUM PHOSPHATE 10 MG/ML IJ SOLN
INTRAMUSCULAR | Status: AC
  Filled 2017-07-31: qty 1

## 2017-07-31 MED ORDER — ONDANSETRON HCL 4 MG/2ML IJ SOLN: 4.0000 mg | Freq: Once | INTRAMUSCULAR | Status: DC | PRN

## 2017-07-31 MED ORDER — LIDOCAINE HCL (PF) 2 % IJ SOLN
INTRAMUSCULAR | Status: AC
  Filled 2017-07-31: qty 2

## 2017-07-31 MED ORDER — MORPHINE SULFATE (PF) 4 MG/ML IV SOLN
1.0000 mg | INTRAVENOUS | Status: DC | PRN
  Administered 2017-07-31: 1 mg via INTRAVENOUS
  Filled 2017-07-31: qty 1

## 2017-07-31 MED ORDER — PROPOFOL 10 MG/ML IV BOLUS
INTRAVENOUS | Status: AC
  Filled 2017-07-31: qty 20

## 2017-07-31 MED ORDER — HYDROCODONE-ACETAMINOPHEN 7.5-325 MG PO TABS
1.0000 | ORAL_TABLET | ORAL | Status: DC | PRN
  Administered 2017-07-31: 1 via ORAL
  Filled 2017-07-31: qty 1
  Filled 2017-07-31: qty 2

## 2017-07-31 MED ORDER — CELECOXIB 200 MG PO CAPS
200.0000 mg | ORAL_CAPSULE | Freq: Two times a day (BID) | ORAL | Status: DC
  Administered 2017-07-31 – 2017-08-02 (×4): 200 mg via ORAL
  Filled 2017-07-31 (×4): qty 1

## 2017-07-31 MED ORDER — DEXAMETHASONE SODIUM PHOSPHATE 10 MG/ML IJ SOLN
INTRAMUSCULAR | Status: DC | PRN
  Administered 2017-07-31: 5 mg via INTRAVENOUS

## 2017-07-31 MED ORDER — SODIUM CHLORIDE 0.9 % IV SOLN: INTRAVENOUS | Status: DC

## 2017-07-31 MED ORDER — LIDOCAINE HCL (PF) 2 % IJ SOLN
INTRAMUSCULAR | Status: DC | PRN
  Administered 2017-07-31: 50 mg

## 2017-07-31 MED ORDER — SODIUM CHLORIDE 0.45 % IV SOLN
INTRAVENOUS | Status: DC
  Administered 2017-07-31: 21:00:00 via INTRAVENOUS

## 2017-07-31 MED ORDER — CEFAZOLIN SODIUM-DEXTROSE 2-4 GM/100ML-% IV SOLN
2.0000 g | Freq: Three times a day (TID) | INTRAVENOUS | Status: AC
  Administered 2017-07-31 – 2017-08-01 (×3): 2 g via INTRAVENOUS
  Filled 2017-07-31 (×5): qty 100

## 2017-07-31 MED ORDER — MORPHINE SULFATE (PF) 2 MG/ML IV SOLN
1.0000 mg | INTRAVENOUS | Status: DC | PRN
  Administered 2017-08-01: 1 mg via INTRAVENOUS
  Filled 2017-07-31: qty 1

## 2017-07-31 MED ORDER — METOCLOPRAMIDE HCL 10 MG PO TABS: 5.0000 mg | ORAL_TABLET | Freq: Three times a day (TID) | ORAL | Status: DC | PRN

## 2017-07-31 MED ORDER — CLINDAMYCIN PHOSPHATE 600 MG/50ML IV SOLN
600.0000 mg | INTRAVENOUS | Status: AC
  Administered 2017-07-31: 600 mg via INTRAVENOUS
  Filled 2017-07-31: qty 50

## 2017-07-31 MED ORDER — CEFAZOLIN SODIUM-DEXTROSE 2-4 GM/100ML-% IV SOLN
2.0000 g | INTRAVENOUS | Status: AC
  Administered 2017-07-31: 2 g via INTRAVENOUS
  Filled 2017-07-31: qty 100

## 2017-07-31 MED ORDER — ACETAMINOPHEN 325 MG PO TABS
650.0000 mg | ORAL_TABLET | Freq: Four times a day (QID) | ORAL | Status: DC | PRN
  Administered 2017-08-01 (×3): 650 mg via ORAL
  Filled 2017-07-31 (×3): qty 2

## 2017-07-31 MED ORDER — ACETAMINOPHEN 650 MG RE SUPP: 650.0000 mg | Freq: Four times a day (QID) | RECTAL | Status: DC | PRN

## 2017-07-31 MED ORDER — METHOCARBAMOL 500 MG PO TABS
500.0000 mg | ORAL_TABLET | Freq: Four times a day (QID) | ORAL | Status: DC | PRN
  Administered 2017-07-31: 500 mg via ORAL
  Filled 2017-07-31: qty 1

## 2017-07-31 MED ORDER — FENTANYL CITRATE (PF) 100 MCG/2ML IJ SOLN
INTRAMUSCULAR | Status: AC
  Filled 2017-07-31: qty 2

## 2017-07-31 MED ORDER — BUPIVACAINE HCL (PF) 0.5 % IJ SOLN
INTRAMUSCULAR | Status: DC | PRN
  Administered 2017-07-31: 25 mL

## 2017-07-31 MED ORDER — FENTANYL CITRATE (PF) 100 MCG/2ML IJ SOLN
INTRAMUSCULAR | Status: DC | PRN
  Administered 2017-07-31: 50 ug via INTRAVENOUS
  Administered 2017-07-31: 25 ug via INTRAVENOUS
  Administered 2017-07-31: 50 ug via INTRAVENOUS

## 2017-07-31 MED ORDER — CHLORHEXIDINE GLUCONATE 4 % EX LIQD: 60.0000 mL | Freq: Once | CUTANEOUS | Status: DC

## 2017-07-31 MED ORDER — FLEET ENEMA 7-19 GM/118ML RE ENEM: 1.0000 | ENEMA | Freq: Once | RECTAL | Status: DC | PRN

## 2017-07-31 MED ORDER — FENTANYL CITRATE (PF) 100 MCG/2ML IJ SOLN: 25.0000 ug | INTRAMUSCULAR | Status: DC | PRN

## 2017-07-31 MED ORDER — BISACODYL 10 MG RE SUPP: 10.0000 mg | Freq: Every day | RECTAL | Status: DC | PRN

## 2017-07-31 MED ORDER — AMLODIPINE BESYLATE 5 MG PO TABS
5.0000 mg | ORAL_TABLET | Freq: Every day | ORAL | Status: DC
  Administered 2017-08-01 – 2017-08-02 (×2): 5 mg via ORAL
  Filled 2017-07-31 (×2): qty 1

## 2017-07-31 MED ORDER — SENNA 8.6 MG PO TABS
1.0000 | ORAL_TABLET | Freq: Two times a day (BID) | ORAL | Status: DC
  Administered 2017-07-31 – 2017-08-02 (×4): 8.6 mg via ORAL
  Filled 2017-07-31 (×4): qty 1

## 2017-07-31 SURGICAL SUPPLY — 47 items
BIT DRILL 2 FAST STEP (BIT) ×2 IMPLANT
BIT DRILL 2.5X4 QC (BIT) ×2 IMPLANT
BLADE SURG MINI STRL (BLADE) ×3 IMPLANT
BNDG COHESIVE 4X5 TAN STRL (GAUZE/BANDAGES/DRESSINGS) IMPLANT
BNDG ESMARK 4X12 TAN STRL LF (GAUZE/BANDAGES/DRESSINGS) ×3 IMPLANT
CANISTER SUCT 1200ML W/VALVE (MISCELLANEOUS) ×3 IMPLANT
CHLORAPREP W/TINT 26ML (MISCELLANEOUS) ×3 IMPLANT
CUFF TOURN 18 STER (MISCELLANEOUS) IMPLANT
DRAPE FLUOR MINI C-ARM 54X84 (DRAPES) ×3 IMPLANT
ELECT REM PT RETURN 9FT ADLT (ELECTROSURGICAL) ×3
ELECTRODE REM PT RTRN 9FT ADLT (ELECTROSURGICAL) ×1 IMPLANT
GAUZE FLUFF 18X24 1PLY STRL (GAUZE/BANDAGES/DRESSINGS) ×3 IMPLANT
GAUZE PETRO XEROFOAM 1X8 (MISCELLANEOUS) ×3 IMPLANT
GAUZE SPONGE 4X4 12PLY STRL (GAUZE/BANDAGES/DRESSINGS) ×3 IMPLANT
GLOVE INDICATOR 8.0 STRL GRN (GLOVE) ×3 IMPLANT
GLOVE SURG ORTHO 8.5 STRL (GLOVE) ×3 IMPLANT
GOWN STRL REUS W/ TWL LRG LVL3 (GOWN DISPOSABLE) ×1 IMPLANT
GOWN STRL REUS W/TWL LRG LVL3 (GOWN DISPOSABLE) ×3
GOWN STRL REUS W/TWL LRG LVL4 (GOWN DISPOSABLE) ×3 IMPLANT
K-WIRE 1.6 (WIRE) ×3
K-WIRE FX5X1.6XNS BN SS (WIRE) ×1
KIT RM TURNOVER STRD PROC AR (KITS) ×3 IMPLANT
KWIRE FX5X1.6XNS BN SS (WIRE) IMPLANT
NDL SAFETY 18GX1.5 (NEEDLE) ×3 IMPLANT
NS IRRIG 500ML POUR BTL (IV SOLUTION) ×3 IMPLANT
PACK EXTREMITY ARMC (MISCELLANEOUS) ×3 IMPLANT
PADDING CAST 4IN STRL (MISCELLANEOUS) ×4
PADDING CAST BLEND 4X4 STRL (MISCELLANEOUS) ×2 IMPLANT
PEG SUBCHONDRAL SMOOTH 2.0X16 (Peg) ×2 IMPLANT
PEG SUBCHONDRAL SMOOTH 2.0X18 (Peg) ×6 IMPLANT
PLATE SHORT 21.6X48.9 NRRW RT (Plate) ×2 IMPLANT
SCREW BN 12X3.5XNS CORT TI (Screw) IMPLANT
SCREW CORT 3.5X12 (Screw) IMPLANT
SCREW CORT 3.5X14 LNG (Screw) ×2 IMPLANT
SCREW PEG LOCK 2.5X16 (Peg) ×2 IMPLANT
SCREW PEG LOCK 2.5X18 (Peg) IMPLANT
SCREW PEG LOCK 2.5X20 (Peg) ×2 IMPLANT
SPLINT CAST 1 STEP 3X12 (MISCELLANEOUS) ×3 IMPLANT
SPONGE LAP 18X18 5 PK (GAUZE/BANDAGES/DRESSINGS) ×3 IMPLANT
STAPLER SKIN PROX 35W (STAPLE) ×3 IMPLANT
STOCKINETTE 48X4 2 PLY STRL (GAUZE/BANDAGES/DRESSINGS) ×1 IMPLANT
STOCKINETTE BIAS CUT 4 980044 (GAUZE/BANDAGES/DRESSINGS) ×3 IMPLANT
STOCKINETTE STRL 4IN 9604848 (GAUZE/BANDAGES/DRESSINGS) ×3 IMPLANT
SUT VIC AB 2-0 SH 27 (SUTURE) ×3
SUT VIC AB 2-0 SH 27XBRD (SUTURE) ×1 IMPLANT
SUT VIC AB 3-0 SH 27 (SUTURE) ×3
SUT VIC AB 3-0 SH 27X BRD (SUTURE) ×1 IMPLANT

## 2017-07-31 NOTE — ED Notes (Signed)
Darol DestineKathryn Sell   [phone]  Call when discharge

## 2017-07-31 NOTE — ED Notes (Signed)
Report given to OR  IV antibiotics sent to OR with pt along clothing and purse

## 2017-07-31 NOTE — Anesthesia Preprocedure Evaluation (Signed)
Anesthesia Evaluation  Patient identified by MRN, date of birth, ID band Patient awake    Reviewed: Allergy & Precautions, H&P , NPO status , Patient's Chart, lab work & pertinent test results, reviewed documented beta blocker date and time   Airway Mallampati: II  TM Distance: >3 FB Neck ROM: full    Dental  (+) Teeth Intact   Pulmonary neg pulmonary ROS,    Pulmonary exam normal        Cardiovascular Exercise Tolerance: Good hypertension, negative cardio ROS Normal cardiovascular exam Rate:Normal     Neuro/Psych negative neurological ROS  negative psych ROS   GI/Hepatic negative GI ROS, Neg liver ROS,   Endo/Other  negative endocrine ROS  Renal/GU negative Renal ROS  negative genitourinary   Musculoskeletal   Abdominal   Peds  Hematology negative hematology ROS (+)   Anesthesia Other Findings   Reproductive/Obstetrics negative OB ROS                             Anesthesia Physical Anesthesia Plan  ASA: II and emergent  Anesthesia Plan: General LMA   Post-op Pain Management:    Induction:   PONV Risk Score and Plan: 4 or greater and Ondansetron, Dexamethasone, Midazolam and Propofol infusion  Airway Management Planned:   Additional Equipment:   Intra-op Plan:   Post-operative Plan:   Informed Consent: I have reviewed the patients History and Physical, chart, labs and discussed the procedure including the risks, benefits and alternatives for the proposed anesthesia with the patient or authorized representative who has indicated his/her understanding and acceptance.     Plan Discussed with: CRNA  Anesthesia Plan Comments:         Anesthesia Quick Evaluation

## 2017-07-31 NOTE — H&P (Signed)
Lori Edwards is an 83 y.o. female.    Chief Complaint: Right wrist pain  HPI: The patient tripped on a curb today and fell on the right wrist and knee.  Brought to ER where exam and x-rays show a volarly displaced right distal radius fracture. The knee had a small abrasion only.  I have recommended ORIF of the fracture due to the high tendency of these to redisplace after closed reduction. Risks and benefits discussed with her and she agrees to open procedure. Plan surgery later today since she ate at 9AM  Past Medical History:  Diagnosis Date  . Hyperlipidemia   . Hypertension   . Thyroid disease     History reviewed. No pertinent surgical history.  Family History  Problem Relation Age of Onset  . Stroke Other        Parent   Social History:  reports that she has never smoked. She has never used smokeless tobacco. She reports that she does not drink alcohol or use drugs.  Allergies: No Known Allergies   (Not in a hospital admission)  Results for orders placed or performed during the hospital encounter of 07/31/17 (from the past 48 hour(s))  Comprehensive metabolic panel     Status: Abnormal   Collection Time: 07/31/17  1:59 PM  Result Value Ref Range   Sodium 138 135 - 145 mmol/L   Potassium 3.8 3.5 - 5.1 mmol/L   Chloride 103 101 - 111 mmol/L   CO2 25 22 - 32 mmol/L   Glucose, Bld 117 (H) 65 - 99 mg/dL   BUN 17 6 - 20 mg/dL   Creatinine, Ser 0.98 0.44 - 1.00 mg/dL   Calcium 9.9 8.9 - 10.3 mg/dL   Total Protein 8.9 (H) 6.5 - 8.1 g/dL   Albumin 4.8 3.5 - 5.0 g/dL   AST 34 15 - 41 U/L   ALT 28 14 - 54 U/L   Alkaline Phosphatase 60 38 - 126 U/L   Total Bilirubin 0.8 0.3 - 1.2 mg/dL   GFR calc non Af Amer 51 (L) >60 mL/min   GFR calc Af Amer 59 (L) >60 mL/min    Comment: (NOTE) The eGFR has been calculated using the CKD EPI equation. This calculation has not been validated in all clinical situations. eGFR's persistently <60 mL/min signify possible Chronic  Kidney Disease.    Anion gap 10 5 - 15  CBC with Differential     Status: Abnormal   Collection Time: 07/31/17  1:59 PM  Result Value Ref Range   WBC 14.9 (H) 3.6 - 11.0 K/uL   RBC 4.80 3.80 - 5.20 MIL/uL   Hemoglobin 14.8 12.0 - 16.0 g/dL   HCT 43.6 35.0 - 47.0 %   MCV 91.0 80.0 - 100.0 fL   MCH 30.8 26.0 - 34.0 pg   MCHC 33.8 32.0 - 36.0 g/dL   RDW 13.4 11.5 - 14.5 %   Platelets 267 150 - 440 K/uL   Neutrophils Relative % 91 %   Neutro Abs 13.6 (H) 1.4 - 6.5 K/uL   Lymphocytes Relative 6 %   Lymphs Abs 0.9 (L) 1.0 - 3.6 K/uL   Monocytes Relative 3 %   Monocytes Absolute 0.4 0.2 - 0.9 K/uL   Eosinophils Relative 0 %   Eosinophils Absolute 0.0 0 - 0.7 K/uL   Basophils Relative 0 %   Basophils Absolute 0.0 0 - 0.1 K/uL   Dg Wrist Complete Right  Result Date: 07/31/2017 CLINICAL DATA:  Fall.  Initial encounter. EXAM: RIGHT WRIST - COMPLETE 3+ VIEW COMPARISON:  None. FINDINGS: There is a transverse fracture of the distal radial metaphysis with nearly 100% ventral displacement. Ventral impaction with ulnar positive variance. Nondisplaced ulnar styloid fracture. First CMC osteoarthritis.  Osteopenia. IMPRESSION: 1. Distal radial metaphysis fracture with volar displacement and impaction. 2. Nondisplaced ulnar styloid fracture. Electronically Signed   By: Monte Fantasia M.D.   On: 07/31/2017 12:38   Dg Chest Portable 1 View  Result Date: 07/31/2017 CLINICAL DATA:  Pre operative respiratory exam. Right distal radius fracture. For open reduction and internal fixation. EXAM: PORTABLE CHEST 1 VIEW COMPARISON:  04/17/2009 FINDINGS: Heart size and pulmonary vascularity are normal and the lungs are clear. Aortic atherosclerosis. No significant bone abnormality. IMPRESSION: No acute abnormalities. Aortic atherosclerosis. Electronically Signed   By: Lorriane Shire M.D.   On: 07/31/2017 13:49    ROS  Blood pressure (!) 149/71, pulse 69, temperature 97.8 F (36.6 C), temperature source Oral,  resp. rate 18, height 5' (1.524 m), weight 58.1 kg (128 lb), SpO2 95 %.   Physical Exam Alert and cooperative.  Small abrasion over right knee.  ROM full without pain.  Right wrist with swelling and deformity with dorsal angulation of radius.  CSM good distally and skin intact.  No median nerve numbness.    Assessment/Plan Volarly displaced right distal radius fracture-- needs ORIF with volar plate.    Park Breed, MD 07/31/2017, 2:54 PM

## 2017-07-31 NOTE — Anesthesia Post-op Follow-up Note (Signed)
Anesthesia QCDR form completed.        

## 2017-07-31 NOTE — Op Note (Signed)
07/31/2017  7:15 PM  PATIENT:  Lori Edwards    PRE-OPERATIVE DIAGNOSIS:  RIGHT DISTAL RADIAL FRACTURE  POST-OPERATIVE DIAGNOSIS:  Same  PROCEDURE:  OPEN REDUCTION INTERNAL FIXATION (ORIF) DISTAL RADIAL FRACTURE  SURGEON:  Valinda HoarMILLER,Junah Yam E, MD  TOURNIQUET TIME:  72  MIN  ANESTHESIA:   General  PREOPERATIVE INDICATIONS:  Lori Edwards is a  83 y.o. female with a diagnosis of RIGHT DISTAL RADIAL FRACTURE who failed conservative measures and elected for surgical management.    The risks benefits and alternatives were discussed with the patient preoperatively including but not limited to the risks of infection, bleeding, nerve injury, malunion, nonunion, wrist stiffness, persistent wrist pain, osteoarthritis and the need for further surgery. Medical risks include but are not limited to DVT and pulmonary embolism, myocardial infarction, stroke, pneumonia, respiratory failure and death. Patient  understood these risks and wished to proceed.   OPERATIVE IMPLANTS: [employer]omet hand innovations plate, 4 hole  OPERATIVE FINDINGS: Volarly displaced distal radius fracture with comminution  OPERATIVE PROCEDURE: Patient was seen in the preoperative area. I marked the operative hand with the word yes and my initials according the hospital's correct site of surgery protocol. Patient was then brought to the operating roomand was placed supine on the operative table and underwent general anesthesia with an LMA.   The operative arm was prepped and draped in a sterile fashion. A timeout performed to verify the patient's name, date of birth, medical record number, correct site of surgery correct procedure to be performed. The timeout was also used a timeout to verify patient received antibiotics and appropriate instruments, implants and radiographs studies were available in the room. Once all in attendance were in agreement case began.   Patient then had the operative extremity exsanguinated with an Esmarch.  The tourniquet was placed on the upper extremity and inflated 250 mm.  A manual reduction of the fracture was performed. The fracture reduction was confirmed on FluoroScan imaging.  A linear incision was then made over the FCR tendon. The subcutaneous tissue was carefully dissected using Metzenbaum scissor and Adson pickup. Retractors were used to protect the radial artery and median nerve. The pronator quadratus was identified and incised and elevated off the volar surface of the distal radius. A 3  hole [employer] volar plate was then positioned on the under surface of the distal radius. It was held into position with a K wire. The position of the plate was confirmed on AP and lateral images. Once the plate was in good position a cortical screw was placed bicortically in the sliding hole. Attention was then turned to the distal pegs. The proximal row of pegs was placed first. Each individual peg hole was drilled and then measured with a depth gauge. The proximal row had 2 threaded pegs placed. The distal row was then drilled and smooth pegs were placed. The position and length of all screws were confirmed on AP and lateral FluoroScan imaging. Care was taken to avoid penetration of any peg through the articular surface of the distal radius.  Once all distal pegs were placed, the attention was turned back to placement of bicortical shaft screws. Additional screws were placed in the plate to fill the remaining holes. The wound was then copiously irrigated. Final FluoroScan imaging of the construct were taken. The fracture was in anatomic position and the hardware was well-positioned. The wound again was copiously irrigated. The soft tissue was then carefully over the plate. The tissues were infiltrated with 1/2%  marcaine.  The skin was closed with staples. Xeroform and a dry sterile dressing were applied along with a volar splint. I was scrubbed and present for the entire case and all sharp and instrument  counts were correct at the conclusion the case. The patient tolerated this procedure well and was awakened and taken to the recovery room in good condition.   Deeann Saint, MD

## 2017-07-31 NOTE — ED Triage Notes (Signed)
Presents s/p fall  Landed on right arm  having pain to right wrist with swelling and questionable deformity  Positive pulses

## 2017-07-31 NOTE — ED Provider Notes (Signed)
-----------------------------------------   1:56 PM on 07/31/2017 -----------------------------------------  EKG reviewed and interpreted by myself shows normal sinus rhythm at 65 bpm, narrow QRS, normal axis, normal intervals no concerning ST changes. Overall reassuring EKG.   Minna AntisPaduchowski, Tama Grosz, MD 07/31/17 1356

## 2017-07-31 NOTE — H&P (Signed)
THE PATIENT WAS SEEN PRIOR TO SURGERY TODAY.  HISTORY, ALLERGIES, HOME MEDICATIONS AND OPERATIVE PROCEDURE WERE REVIEWED. RISKS AND BENEFITS OF SURGERY DISCUSSED WITH PATIENT AGAIN.  NO CHANGES FROM INITIAL HISTORY AND PHYSICAL NOTED.    

## 2017-07-31 NOTE — Anesthesia Procedure Notes (Signed)
Procedure Name: LMA Insertion Date/Time: 07/31/2017 5:39 PM Performed by: Lori Edwards, Lori Nippert Pre-anesthesia Checklist: Patient identified, Emergency Drugs available, Suction available and Patient being monitored Patient Re-evaluated:Patient Re-evaluated prior to induction Oxygen Delivery Method: Circle system utilized Preoxygenation: Pre-oxygenation with 100% oxygen Induction Type: IV induction Ventilation: Mask ventilation without difficulty LMA: LMA inserted LMA Size: 3.0

## 2017-07-31 NOTE — Consult Note (Signed)
[facility] [place] [facility]   PATIENT NAME: Lori Edwards    MR#:  [id number]  DATE OF BIRTH:  09/11/1929  DATE OF CONSULT:  07/31/2017  PRIMARY CARE PHYSICIAN: Glori Luis, MD   REQUESTING/REFERRING PHYSICIAN: Dr. Deeann Saint  CHIEF COMPLAINT:   Chief Complaint  Patient presents with  . Wrist Pain    HISTORY OF PRESENT ILLNESS:  Lori Edwards  is a 83 y.o. female with a known history of Hypertension, hypothyroidism, hyperlipidemia who presents to the hospital after a mechanical fall and noted to have a right wrist fracture. Patient says she was attempting to get up out of her car and lost her footing and fell to the ground on her right hand. She was having significant pain and therefore came to the ER for further evaluation. Patient was noted to have a distal right radial fracture. Orthopedics was consulted and their admitting the patient and consulted hospitalist for preoperative medical clearance.  Patient denies any prodromal symptoms prior to her fall. She has no previous history of cardiac disease. Patient denies any other associated symptoms presently.  PAST MEDICAL HISTORY:   Past Medical History:  Diagnosis Date  . Hyperlipidemia   . Hypertension   . Thyroid disease     PAST SURGICAL HISTOIRY:  History reviewed. No pertinent surgical history.  SOCIAL HISTORY:   Social History  Substance Use Topics  . Smoking status: Never Smoker  . Smokeless tobacco: Never Used  . Alcohol use No    FAMILY HISTORY:   Family History  Problem Relation Age of Onset  . Stroke Other        Parent  . Stroke Mother     DRUG ALLERGIES:  No Known Allergies  REVIEW OF SYSTEMS:   Review of Systems  Constitutional: Negative for fever and weight loss.  HENT: Negative for congestion, nosebleeds and tinnitus.   Eyes: Negative for blurred vision, double vision and redness.  Respiratory: Negative for cough, hemoptysis and shortness of breath.    Cardiovascular: Negative for chest pain, orthopnea, leg swelling and PND.  Gastrointestinal: Negative for abdominal pain, diarrhea, melena, nausea and vomiting.  Genitourinary: Negative for dysuria, hematuria and urgency.  Musculoskeletal: Positive for falls and joint pain (Right Wrist/Arm).  Neurological: Negative for dizziness, tingling, sensory change, focal weakness, seizures, weakness and headaches.  Endo/Heme/Allergies: Negative for polydipsia. Does not bruise/bleed easily.  Psychiatric/Behavioral: Negative for depression and memory loss. The patient is not nervous/anxious.      MEDICATIONS AT HOME:   Prior to Admission medications   Medication Sig Start Date End Date Taking? Authorizing Provider  amLODipine (NORVASC) 5 MG tablet Take 1 tablet (5 mg total) by mouth daily. 08/25/16  Yes Glori Luis, MD  aspirin EC 81 MG tablet Take 81 mg by mouth daily.   Yes [provider]  Multiple Vitamins-Minerals (ONE-A-DAY ENERGY PO) Take 1 tablet by mouth daily.   Yes [provider]  Omega-3 Fatty Acids (FISH OIL) 1000 MG CAPS Take 1 capsule by mouth daily.   Yes [provider]  Polyethyl Glycol-Propyl Glycol (SYSTANE OP) Apply 1 drop to eye daily as needed. Dry Eyes   Yes [provider]  Red Yeast Rice Extract (RED YEAST RICE PO) Take 2 tablets by mouth daily.   Yes [provider]      VITAL SIGNS:  Blood pressure (!) 149/71, pulse 69, temperature 97.8 F (36.6 C), temperature source Oral, resp. rate 18, height 5' (1.524 m), weight  58.1 kg (128 lb), SpO2 95 %.  PHYSICAL EXAMINATION:  GENERAL:  83 y.o.-year-old patient lying in the bed in no acute distress.  EYES: Pupils equal, round, reactive to light and accommodation. No scleral icterus. Extraocular muscles intact.  HEENT: Head atraumatic, normocephalic. Oropharynx and nasopharynx clear.  NECK:  Supple, no jugular venous distention. No thyroid enlargement, no tenderness.  LUNGS:  Normal breath sounds bilaterally, no wheezing, rales, rhonchi . No use of accessory muscles of respiration.  CARDIOVASCULAR: S1, S2, RRR. No murmurs, rubs, gallops, clicks.  ABDOMEN: Soft, nontender, nondistended. Bowel sounds present. No organomegaly or mass.  EXTREMITIES: No pedal edema, cyanosis, or clubbing. Right wrist/arm is in a brace.  NEUROLOGIC: Cranial nerves II through XII are intact. No focal motor or sensory deficits appreciated bilaterally  PSYCHIATRIC: The patient is alert and oriented x 3.  SKIN: No obvious rash, lesion, or ulcer.   LABORATORY PANEL:   CBC  Recent Labs Lab 07/31/17 1359  WBC 14.9*  HGB 14.8  HCT 43.6  PLT 267   ------------------------------------------------------------------------------------------------------------------  Chemistries   Recent Labs Lab 07/31/17 1359  NA 138  K 3.8  CL 103  CO2 25  GLUCOSE 117*  BUN 17  CREATININE 0.98  CALCIUM 9.9  AST 34  ALT 28  ALKPHOS 60  BILITOT 0.8   ------------------------------------------------------------------------------------------------------------------  Cardiac Enzymes No results for input(s): TROPONINI in the last 168 hours. ------------------------------------------------------------------------------------------------------------------  RADIOLOGY:  Dg Wrist Complete Right  Result Date: 07/31/2017 CLINICAL DATA:  Fall.  Initial encounter. EXAM: RIGHT WRIST - COMPLETE 3+ VIEW COMPARISON:  None. FINDINGS: There is a transverse fracture of the distal radial metaphysis with nearly 100% ventral displacement. Ventral impaction with ulnar positive variance. Nondisplaced ulnar styloid fracture. First CMC osteoarthritis.  Osteopenia. IMPRESSION: 1. Distal radial metaphysis fracture with volar displacement and impaction. 2. Nondisplaced ulnar styloid fracture. Electronically Signed   By: Marnee Spring M.D.   On: 07/31/2017 12:38   Dg Chest Portable 1 View  Result Date:  07/31/2017 CLINICAL DATA:  Pre operative respiratory exam. Right distal radius fracture. For open reduction and internal fixation. EXAM: PORTABLE CHEST 1 VIEW COMPARISON:  04/17/2009 FINDINGS: Heart size and pulmonary vascularity are normal and the lungs are clear. Aortic atherosclerosis. No significant bone abnormality. IMPRESSION: No acute abnormalities. Aortic atherosclerosis. Electronically Signed   By: Francene Boyers M.D.   On: 07/31/2017 13:49     IMPRESSION AND PLAN:   83 year old female with past medical history of hypertension, hyperlipidemia, hypothyroidism who presents to the hospital after a mechanical fall and noted to have a right distal radial fracture.  1. Preoperative medical/cardiovascular examination-patient is a low risk for noncardiac surgery. -No absolute contraindication surgery at this time. Preoperative EKG has been reviewed shows no acute ST or T-wave changes.  2. Status post fall and distal right radial fracture-continue further care as per orthopedics. -Patient to go to the operating room later today.  3. Essential hypertension-continue Norvasc.  4. Leukocytosis - due to fall and fracture.  NO acute infectious source.  - repeat in a.m.   Thanks for the consult  And will sign off for now. Call back if any further help needed.   All the records are reviewed and case discussed with Consulting provider. Management plans discussed with the patient, family and they are in agreement.  CODE STATUS: Full code  TOTAL TIME TAKING CARE OF THIS PATIENT: 40 minutes.    Houston Siren M.D on 07/31/2017 at 3:58 PM  Between 7am to 6pm -  Pager - [phone]  After 6pm go to [email or web address] - password [email or web address] [facility]  [facility]  Office  [phone]  CC: Primary care Physician: Glori LuisSonnenberg, Eric G, MD

## 2017-07-31 NOTE — Transfer of Care (Signed)
Immediate Anesthesia Transfer of Care Note  Patient: Lori Edwards [place]  Procedure(s) Performed: Procedure(s): OPEN REDUCTION INTERNAL FIXATION (ORIF) DISTAL RADIAL FRACTURE (Right)  Patient Location: PACU  Anesthesia Type:General  Level of Consciousness: sedated and patient cooperative  Airway & Oxygen Therapy: Patient Spontanous Breathing and Patient connected to face mask oxygen  Post-op Assessment: Report given to RN and Post -op Vital signs reviewed and stable  Post vital signs: Reviewed and stable  Last Vitals:  Vitals:   07/31/17 1654 07/31/17 1923  BP: 135/70 139/77  Pulse: 70 73  Resp: 16 19  Temp:  (!) 36.1 C  SpO2: 97% 100%    Last Pain:  Vitals:   07/31/17 1657  TempSrc:   PainSc: 2          Complications: No apparent anesthesia complications

## 2017-07-31 NOTE — ED Provider Notes (Signed)
[facility] Emergency Department Provider Note  ____________________________________________   First MD Initiated Contact with Patient 07/31/17 1309     (approximate)  I have reviewed the triage vital signs and the nursing notes.   HISTORY  Chief Complaint Wrist Pain   HPI Lori Edwards is a 83 y.o. female presents today with an injury to her right wrist.Patient states that she tripped and fell. She denies any injury to her head or loss of consciousness. Patient states that she was going to [employer]  [employer]. Patient states that she ate breakfast at approximately 9 AM. She fell at approximately 10:30 AM. She took Tylenol prior to her arrival in the emergency department and states that she does not need pain medication at this time. Currently she rates her pain as an 8 out of 10.   Past Medical History:  Diagnosis Date  . Hyperlipidemia   . Hypertension   . Thyroid disease     Patient Active Problem List   Diagnosis Date Noted  . Overweight (BMI 25.0-29.9) 02/22/2017  . Essential hypertension 08/25/2016  . Hyperlipidemia 08/25/2016  . Osteoporosis 08/25/2016    History reviewed. No pertinent surgical history.  Prior to Admission medications   Medication Sig Start Date End Date Taking? Authorizing Provider  amLODipine (NORVASC) 5 MG tablet Take 1 tablet (5 mg total) by mouth daily. 08/25/16  Yes Glori LuisSonnenberg, Eric G, MD  aspirin EC 81 MG tablet Take 81 mg by mouth daily.   Yes [provider]  Multiple Vitamins-Minerals (ONE-A-DAY ENERGY PO) Take 1 tablet by mouth daily.   Yes [provider]  Omega-3 Fatty Acids (FISH OIL) 1000 MG CAPS Take 1 capsule by mouth daily.   Yes [provider]  Polyethyl Glycol-Propyl Glycol (SYSTANE OP) Apply 1 drop to eye daily as needed. Dry Eyes   Yes [provider]  Red Yeast Rice Extract (RED YEAST RICE PO) Take 2 tablets by mouth daily.   Yes [provider]     Allergies Patient has no known allergies.  Family History  Problem Relation Age of Onset  . Stroke Other        Parent  . Stroke Mother     Social History Social History  Substance Use Topics  . Smoking status: Never Smoker  . Smokeless tobacco: Never Used  . Alcohol use No    Review of Systems Constitutional: No fever/chills Eyes: No visual changes. ENT: No trauma Cardiovascular: Denies chest pain. Respiratory: Denies shortness of breath. Gastrointestinal:  Initial nausea resolved nausea after accident, no vomiting.   Musculoskeletal: Positive for right wrist pain. Skin: Negative for rash. Neurological: Negative for headaches, focal weakness or numbness.   ____________________________________________   PHYSICAL EXAM:  VITAL SIGNS: ED Triage Vitals  Enc Vitals Group     BP 07/31/17 1210 (!) 149/71     Pulse Rate 07/31/17 1210 69     Resp 07/31/17 1210 18     Temp 07/31/17 1210 97.8 F (36.6 C)     Temp Source 07/31/17 1210 Oral     SpO2 07/31/17 1210 95 %     Weight 07/31/17 1211 128 lb (58.1 kg)     Height 07/31/17 1211 5' (1.524 m)     Head Circumference --      Peak Flow --      Pain Score 07/31/17 1210 8     Pain Loc --      Pain Edu? --      Excl.  in GC? --     Constitutional: Alert and oriented. Well appearing and in no acute distress. Eyes: Conjunctivae are normal. PERRL. EOMI. Head: Atraumatic. Nose: No trauma Neck: No stridor.  No cervical tenderness on palpation posteriorly. Cardiovascular: Normal rate, regular rhythm. Grossly normal heart sounds.  Good peripheral circulation. Respiratory: Normal respiratory effort.  No retractions. Lungs CTAB. Gastrointestinal: Soft and nontender. No distention.  Musculoskeletal: On examination of the right wrist there is obvious deformity of the distal radius/ulna aspect. Range of motion is restricted secondary to discomfort. Motor sensory function intact distally in each digit. Capillary refill is less  than 3 seconds 5 digits. Skin is intact. Pulse is present. Neurologic:  Normal speech and language. No gross focal neurologic deficits are appreciated. No gait instability. Skin:  Skin is warm, dry and intact.  Psychiatric: Mood and affect are normal. Speech and behavior are normal.  ____________________________________________   LABS (all labs ordered are listed, but only abnormal results are displayed)  Labs Reviewed  COMPREHENSIVE METABOLIC PANEL - Abnormal; Notable for the following:       Result Value   Glucose, Bld 117 (*)    Total Protein 8.9 (*)    GFR calc non Af Amer 51 (*)    GFR calc Af Amer 59 (*)    All other components within normal limits  CBC WITH DIFFERENTIAL/PLATELET - Abnormal; Notable for the following:    WBC 14.9 (*)    Neutro Abs 13.6 (*)    Lymphs Abs 0.9 (*)    All other components within normal limits  URINALYSIS, COMPLETE (UACMP) WITH MICROSCOPIC - Abnormal; Notable for the following:    Color, Urine YELLOW (*)    APPearance CLEAR (*)    Ketones, ur 5 (*)    All other components within normal limits  PROTIME-INR   ____________________________________________  EKG  Per Dr. Marisa Severin ____________________________________________  RADIOLOGY  Dg Wrist Complete Right  Result Date: 07/31/2017 CLINICAL DATA:  Fall.  Initial encounter. EXAM: RIGHT WRIST - COMPLETE 3+ VIEW COMPARISON:  None. FINDINGS: There is a transverse fracture of the distal radial metaphysis with nearly 100% ventral displacement. Ventral impaction with ulnar positive variance. Nondisplaced ulnar styloid fracture. First CMC osteoarthritis.  Osteopenia. IMPRESSION: 1. Distal radial metaphysis fracture with volar displacement and impaction. 2. Nondisplaced ulnar styloid fracture. Electronically Signed   By: Marnee Spring M.D.   On: 07/31/2017 12:38   Dg Chest Portable 1 View  Result Date: 07/31/2017 CLINICAL DATA:  Pre operative respiratory exam. Right distal radius fracture. For  open reduction and internal fixation. EXAM: PORTABLE CHEST 1 VIEW COMPARISON:  04/17/2009 FINDINGS: Heart size and pulmonary vascularity are normal and the lungs are clear. Aortic atherosclerosis. No significant bone abnormality. IMPRESSION: No acute abnormalities. Aortic atherosclerosis. Electronically Signed   By: Francene Boyers M.D.   On: 07/31/2017 13:49    ____________________________________________   PROCEDURES  Procedure(s) performed: None  Procedures  Critical Care performed: No  ____________________________________________   INITIAL IMPRESSION / ASSESSMENT AND PLAN / ED COURSE  Pertinent labs & imaging results that were available during my care of the patient were reviewed by me and considered in my medical decision making (see chart for details).  Dr. Hyacinth Meeker who is the orthopedist on call was notified. He will come and see the patient in the department. Also hospitalist will evaluate for medical clearance. Patient is aware. Patient will be going to surgery later this evening.   ___________________________________________   FINAL CLINICAL IMPRESSION(S) / ED DIAGNOSES  Final diagnoses:  Other closed fracture of distal end of right radius, initial encounter  Closed nondisplaced fracture of styloid process of right ulna, initial encounter  Fall, initial encounter      NEW MEDICATIONS STARTED DURING THIS VISIT:  New Prescriptions   No medications on file     Note:  This document was prepared using Dragon voice recognition software and may include unintentional dictation errors.    Tommi Rumps, PA-C 07/31/17 1657    Dionne Bucy, MD 08/01/17 0800    Dionne Bucy, MD 08/01/17 [phone]

## 2017-07-31 NOTE — ED Notes (Signed)
Pt given ice pack for right wrist

## 2017-08-01 ENCOUNTER — Encounter: Payer: Self-pay | Admitting: Specialist

## 2017-08-01 DIAGNOSIS — S52591A Other fractures of lower end of right radius, initial encounter for closed fracture: Secondary | ICD-10-CM | POA: Diagnosis not present

## 2017-08-01 NOTE — Clinical Social Work Placement (Signed)
   CLINICAL SOCIAL WORK PLACEMENT  NOTE  Date:  08/01/2017  Patient Details  Name: Lori Edwards MRN: [id number] Date of Birth: 11/07/1929  Clinical Social Work is seeking post-discharge placement for this patient at the Skilled  Nursing Facility level of care (*CSW will initial, date and re-position this form in  chart as items are completed):  Yes   Patient/family provided with [place] Clinical Social Work Department's list of facilities offering this level of care within the geographic area requested by the patient (or if unable, by the patient's family).  Yes   Patient/family informed of their freedom to choose among providers that offer the needed level of care, that participate in Medicare, Medicaid or managed care program needed by the patient, have an available bed and are willing to accept the patient.  Yes   Patient/family informed of 's ownership interest in [facility] and [facility], as well as of the fact that they are under no obligation to receive care at these facilities.  PASRR submitted to EDS on 08/01/17     PASRR number received on 08/01/17     Existing PASRR number confirmed on       FL2 transmitted to all facilities in geographic area requested by pt/family on 08/01/17     FL2 transmitted to all facilities within larger geographic area on       Patient informed that his/her managed care company has contracts with or will negotiate with certain facilities, including the following:        Yes   Patient/family informed of bed offers received.  Patient chooses bed at  [facility]     Physician recommends and patient chooses bed at      Patient to be transferred to   on  .  Patient to be transferred to facility by       Patient family notified on   of transfer.  Name of family member notified:        PHYSICIAN       Additional Comment:    _______________________________________________ Tynan Boesel, Darleen CrockerBailey M,  LCSW 08/01/2017, 1:41 PM

## 2017-08-01 NOTE — Plan of Care (Signed)
Problem: Fluid Volume: Goal: Ability to maintain a balanced intake and output will improve Outcome: Progressing Oral intake is adequate at this time

## 2017-08-01 NOTE — NC FL2 (Signed)
[place] MEDICAID FL2 LEVEL OF CARE SCREENING TOOL     IDENTIFICATION  Patient Name: Lori Edwards Birthdate: 07/14/1929 Sex: female Admission Date (Current Location): 07/31/2017  [place]   Facility and Address:  [facility], [address], [place], [place] [address]      Provider Number: [id number]  Attending Physician Name and Address:  Deeann SaintMiller, Howard, MD  Relative Name and Phone Number:       Current Level of Care: Hospital Recommended Level of Care: Skilled Nursing Facility Prior Approval Number:    Date Approved/Denied:   PASRR Number:  [id number])  Discharge Plan: SNF    Current Diagnoses: Patient Active Problem List   Diagnosis Date Noted  . Radius and ulna distal fracture, right, closed, initial encounter 07/31/2017  . Right wrist fracture, closed, initial encounter 07/31/2017  . Overweight (BMI 25.0-29.9) 02/22/2017  . Essential hypertension 08/25/2016  . Hyperlipidemia 08/25/2016  . Osteoporosis 08/25/2016    Orientation RESPIRATION BLADDER Height & Weight     Self, Situation, Time, Place  Normal Continent Weight: 128 lb (58.1 kg) Height:  5' (152.4 cm)  BEHAVIORAL SYMPTOMS/MOOD NEUROLOGICAL BOWEL NUTRITION STATUS   (none)  (none) Continent Diet (Regular Diet )  AMBULATORY STATUS COMMUNICATION OF NEEDS Skin   Limited Assist Verbally Surgical wounds (Incision: Right Arm. )                       Personal Care Assistance Level of Assistance  Bathing, Feeding, Dressing Bathing Assistance: Limited assistance Feeding assistance: Independent Dressing Assistance: Limited assistance     Functional Limitations Info  Sight, Hearing, Speech Sight Info: Adequate Hearing Info: Adequate Speech Info: Adequate    SPECIAL CARE FACTORS FREQUENCY  PT (By licensed PT)     PT Frequency:  (2-3 )              Contractures      Additional Factors Info  Code Status, Allergies Code Status  Info:  (Full Code. ) Allergies Info:  (No Known Allergies. )           Current Medications (08/01/2017):  This is the current hospital active medication list Current Facility-Administered Medications  Medication Dose Route Frequency Provider Last Rate Last Dose  . 0.45 % sodium chloride infusion   Intravenous Continuous Deeann SaintMiller, Howard, MD 75 mL/hr at 07/31/17 2040    . acetaminophen (TYLENOL) tablet 650 mg  650 mg Oral Q6H PRN Deeann SaintMiller, Howard, MD   650 mg at 08/01/17 [id number]   Or  . acetaminophen (TYLENOL) suppository 650 mg  650 mg Rectal Q6H PRN Deeann SaintMiller, Howard, MD      . amLODipine (NORVASC) tablet 5 mg  5 mg Oral Daily Deeann SaintMiller, Howard, MD   5 mg at 08/01/17 0905  . aspirin EC tablet 81 mg  81 mg Oral Daily Deeann SaintMiller, Howard, MD   81 mg at 08/01/17 [id number]  . bisacodyl (DULCOLAX) suppository 10 mg  10 mg Rectal Daily PRN Deeann SaintMiller, Howard, MD      . ceFAZolin (ANCEF) IVPB 2g/100 mL premix  2 g Intravenous Q8H Deeann SaintMiller, Howard, MD   Stopped at 08/01/17 0725  . celecoxib (CELEBREX) capsule 200 mg  200 mg Oral Q12H Deeann SaintMiller, Howard, MD   200 mg at 08/01/17 0905  . HYDROcodone-acetaminophen (NORCO) 7.5-325 MG per tablet 1-2 tablet  1-2 tablet Oral Q4H PRN Deeann SaintMiller, Howard, MD   1 tablet at 07/31/17 2155  . magnesium hydroxide (MILK OF MAGNESIA)  suspension 30 mL  30 mL Oral Daily PRN Deeann Saint, MD      . methocarbamol (ROBAXIN) tablet 500 mg  500 mg Oral Q6H PRN Deeann Saint, MD   500 mg at 07/31/17 2155   Or  . methocarbamol (ROBAXIN) 500 mg in dextrose 5 % 50 mL IVPB  500 mg Intravenous Q6H PRN Deeann Saint, MD      . metoCLOPramide (REGLAN) tablet 5-10 mg  5-10 mg Oral Q8H PRN Deeann Saint, MD       Or  . metoCLOPramide (REGLAN) injection 5-10 mg  5-10 mg Intravenous Q8H PRN Deeann Saint, MD   10 mg at 08/01/17 0210  . morphine 2 MG/ML injection 1 mg  1 mg Intravenous Q2H PRN Deeann Saint, MD   1 mg at 08/01/17 0225  . ondansetron (ZOFRAN) tablet 4 mg  4 mg Oral Q6H PRN Deeann Saint, MD        Or  . ondansetron [facility]) injection 4 mg  4 mg Intravenous Q6H PRN Deeann Saint, MD   4 mg at 08/01/17 0905  . senna (SENOKOT) tablet 8.6 mg  1 tablet Oral BID Deeann Saint, MD   8.6 mg at 08/01/17 0905  . sodium phosphate (FLEET) 7-19 GM/118ML enema 1 enema  1 enema Rectal Once PRN Deeann Saint, MD         Discharge Medications: Please see discharge summary for a list of discharge medications.  Relevant Imaging Results:  Relevant Lab Results:   Additional Information  (SSN: [id number])  Kodie Pick, Darleen Crocker, LCSW

## 2017-08-01 NOTE — Care Management (Signed)
Met with patient to discuss outpatient bed status and Medicare MOON letter. Patient states she is independent from independent living at [facility]. She is alone in her home. She has support from her community of friends and does not anticipate any discharge needs. PT evaluation is happening now. RNCM will follow their lead.

## 2017-08-01 NOTE — Evaluation (Signed)
Occupational Therapy Evaluation Patient Details Name: LAURIEL HELIN MRN: [id number] DOB: May 17, 1929 Today's Date: 08/01/2017    History of Present Illness The patient tripped on a curb and fell on the right wrist and knee.  Brought to ER where exam and x-rays show a volarly displaced right distal radius fracture. The knee had a small abrasion only.  Orthopedic surgeon recommended ORIF of the fracture due to the high tendency of these to redisplace after closed reduction. Pt is now POD#1 s/p ORIF of radius fracture.    Clinical Impression   Pt seen for OT evaluation this date. Pt was independent and very active at baseline, living at [facility] in [place] apartment. Pt limited this date by dizziness and headache. Pt reports recently having taken pain medication but hadn't kicked in yet. BP taken, 149/63 (improved from this morning). Pt educated in home/routines modifications including medication mgt as well as 1 handed techniques for dressing. Pt verbalized understanding, agreeable to trialing next date. Pt will benefit from skilled OT services to address noted impairments in UE functional use, pain, and decreased ROM and NWB'ing precautions with RUE. Recommend pt follow up with surgeon to discuss need for OP skilled OT services following hospitalization. Recommend supervision intermittently [place].     Follow Up Recommendations  DC plan and follow up therapy as arranged by surgeon (pt would like to utilize respite days at [facility] prior to return [place] apartment)    Equipment Recommendations       Recommendations for Other Services       Precautions / Restrictions Precautions Precautions: Fall Restrictions Weight Bearing Restrictions: Yes RUE Weight Bearing: Non weight bearing      Mobility Bed Mobility Overal bed mobility: Independent             General bed mobility comments: No use of bed rails. Good speed/sequencing when exiting bed to the  left  Transfers Overall transfer level: Independent Equipment used: None Transfers: Sit to/from Stand Sit to Stand: Independent         General transfer comment: Pt able to perform transfers from bed and commode independently without assist. Safe hand placement and good stability once in standing    Balance Overall balance assessment: Needs assistance Sitting-balance support: No upper extremity supported Sitting balance-Leahy Scale: Good     Standing balance support: No upper extremity supported Standing balance-Leahy Scale: Fair Standing balance comment: Able to maintain wide and narrow balance without UE support. Single leg balance is 6-8 seconds bilaterally this afternoon.                           ADL either performed or assessed with clinical judgement   ADL Overall ADL's : Needs assistance/impaired Eating/Feeding: Set up;Sitting Eating/Feeding Details (indicate cue type and reason): L handed Grooming: Set up;Sitting Grooming Details (indicate cue type and reason): L handed           Upper Body Dressing Details (indicate cue type and reason): pt educated in 1 handed technique for UB dressing and recommendations for loose fitting clothing to support easier dressing, pt verbalized understanding. Agreeable to trialing tomorrow, declines this date due to headache and dizziness.     Toilet Transfer: Teacher, early years/pre Details (indicate cue type and reason): good safety awareness, no LOB Toileting- Clothing Manipulation and Hygiene: Sitting/lateral lean;Independent       Functional mobility during ADLs: Min guard;Supervision/safety  Vision Baseline Vision/History: Wears glasses Wears Glasses: At all times Patient Visual Report: No change from baseline       Perception     Praxis      Pertinent Vitals/Pain Pain Assessment: Faces Pain Score: 7  Faces Pain Scale: Hurts whole lot Pain Location: Headache pain. Pt denies  any pain in her RUE Pain Intervention(s): Limited activity within patient's tolerance;Monitored during session;Premedicated before session;Repositioned;Ice applied     Hand Dominance Right   Extremity/Trunk Assessment Upper Extremity Assessment Upper Extremity Assessment: RUE deficits/detail RUE Deficits / Details: intact sensation, no N/T noted, R shoulder ROM WFL; LUE WFL RUE: Unable to fully assess due to immobilization   Lower Extremity Assessment Lower Extremity Assessment: Overall WFL for tasks assessed   Cervical / Trunk Assessment Cervical / Trunk Assessment: Normal   Communication Communication Communication: No difficulties   Cognition Arousal/Alertness: Awake/alert Behavior During Therapy: WFL for tasks assessed/performed Overall Cognitive Status: Within Functional Limits for tasks assessed                                     General Comments       Exercises Other Exercises Other Exercises: pt educated in home/routines modifications to maximize functional independence with bilateral tasks including grooming, medication mgt, meal prep, and dressing. Pt verbalized understanding.   Shoulder Instructions      Home Living Family/patient expects to be discharged to:: Other (Comment) ([place]) Living Arrangements: Alone   Type of Home: Independent living facility ([place] [facility]) Home Access: Level entry     Home Layout: One level     Bathroom Shower/Tub: Producer, television/film/videoWalk-in shower   Bathroom Toilet: Standard     Home Equipment: Shower seat          Prior Functioning/Environment Level of Independence: Independent        Comments: Pt was previously independent with ADLs/IADLs. Driving and going to exercise classes. No prior falls. Lives [facility] in [facility] living [facility]. Prepares all her own meals.        OT Problem List: Impaired UE functional use      OT Treatment/Interventions: Self-care/ADL  training;DME and/or AE instruction;Patient/family education    OT Goals(Current goals can be found in the care plan section) Acute Rehab OT Goals Patient Stated Goal: Return to prior level of function at home OT Goal Formulation: With patient Time For Goal Achievement: 08/08/17 Potential to Achieve Goals: Good  OT Frequency: Min 1X/week   Barriers to D/C:            Co-evaluation              AM-PAC PT "6 Clicks" Daily Activity     Outcome Measure Help from another person eating meals?: None Help from another person taking care of personal grooming?: None Help from another person toileting, which includes using toliet, bedpan, or urinal?: A Little Help from another person bathing (including washing, rinsing, drying)?: A Little Help from another person to put on and taking off regular upper body clothing?: A Little Help from another person to put on and taking off regular lower body clothing?: A Little 6 Click Score: 20   End of Session    Activity Tolerance: Patient tolerated treatment well Patient left: in bed;with call bell/phone within reach;with bed alarm set;Other (comment) (RUE elevated and ice pack applied)  OT Visit Diagnosis: Muscle weakness (generalized) (M62.81)  Time: 1610-9604 OT Time Calculation (min): 18 min Charges:  OT General Charges $OT Visit: 1 Procedure OT Evaluation $OT Eval Low Complexity: 1 Procedure G-Codes: OT G-codes **NOT FOR INPATIENT CLASS** Functional Assessment Tool Used: AM-PAC 6 Clicks Daily Activity;Clinical judgement Functional Limitation: Self care Self Care Current Status (V4098): At least 20 percent but less than 40 percent impaired, limited or restricted Self Care Goal Status (J1914): At least 1 percent but less than 20 percent impaired, limited or restricted   Richrd Prime, MPH, MS, OTR/L ascom [phone] 08/01/17, 4:57 PM

## 2017-08-01 NOTE — Clinical Social Work Note (Signed)
Clinical Social Work Assessment  Patient Details  Name: Lori Edwards MRN: [id number] Date of Birth: 10-01-1929  Date of referral:  08/01/17               Reason for consult:  Facility Placement                Permission sought to share information with:  Chartered certified accountant granted to share information::  Yes, Verbal Permission Granted  Name::      Chief Executive Officer::   Garrett   Relationship::     Contact Information:     Housing/Transportation Living arrangements for the past 2 months:  Charity fundraiser of Information:  Patient, Engineer, materials Patient Interpreter Needed:  None Criminal Activity/Legal Involvement Pertinent to Current Situation/Hospitalization:  No - Comment as needed Significant Relationships:  Other Family Members, Friend Lives with:  Self Do you feel safe going back to the place where you live?  Yes Need for family participation in patient care:  Yes (Comment)  Care giving concerns:  Patient is a Production designer, theatre/television/film independent living resident.    Social Worker assessment / plan:  Holiday representative ([place]) received verbal consult from PT that recommendation is no follow up however patient requested to go to the Healthcare SNF building at [facility] to use her 3 respite days. Per Seth Bake admissions coordinator at [facility] patient can come to [facility] and use her respite days. CSW met with patient and her friend Belenda Cruise was at bedside. Patient was alert and oriented X4 and was laying in the bed. CSW introduced self and explained role of CSW department. Patient reported that she lives alone in independent living and wants to go to [facility]. CSW explained that patient's medicare will not pay for SNF because she will not have a 3 night qualfying inpatient stay at [facility]. CSW explained that patient is in an "outpatient bed". CSW explained that after patient's 3 respite days at [facility] she will have  to pay privately or go home back to independent living. Patient asked if her long term care insurance would pay for SNF. CSW explained that long term care insurance will usually not pay for SNF until a patient has been at a facility for 30 to 60 days. Patient verbalized her understanding and is agreeable to go to [facility] to use her 3 respite days. Patient reported that her friend Belenda Cruise will transport her from [facility] to [facility]. Belenda Cruise confirmed that she would provide transport for patient. FL2 complete and faxed out. CSW will continue to follow and assist as needed.   Employment status:  Retired Forensic scientist:  Medicare PT Recommendations:  No Follow Up Information / Referral to community resources:  [place]  Patient/Family's Response to care:  Patient is agreeable to go to [facility].   Patient/Family's Understanding of and Emotional Response to Diagnosis, Current Treatment, and Prognosis:  Patient was very pleasant and thanked CSW for assistance.   Emotional Assessment Appearance:  Appears stated age Attitude/Demeanor/Rapport:    Affect (typically observed):  Accepting, Adaptable, Pleasant Orientation:  Oriented to Self, Oriented to Place, Oriented to  Time, Oriented to Situation Alcohol / Substance use:  Not Applicable Psych involvement (Current and /or in the community):  No (Comment)  Discharge Needs  Concerns to be addressed:  Discharge Planning Concerns Readmission within the last 30 days:  No Current discharge risk:  Lives alone  Barriers to Discharge:  Continued Medical Work up   [employer], Veronia Beets, LCSW 08/01/2017, 1:42 PM

## 2017-08-01 NOTE — Care Management Obs Status (Signed)
MEDICARE OBSERVATION STATUS NOTIFICATION   Patient Details  Name: Lori Edwards MRN: [id number] Date of Birth: 04/12/1929   Medicare Observation Status Notification Given:  Yes    [name], RN 08/01/2017, 10:25 AM

## 2017-08-01 NOTE — Progress Notes (Signed)
PT Attempt Note  Patient Details Name: Lori Edwards MRN: [id number] DOB: 02/15/1929   Evaluation Attempt:    Reason Eval/Treat Not Completed: Medical issues which prohibited therapy. Order received and chart reviewed. Upon arrival RN with patient performing assessment. Pt is having a severe headache and BP is elevated. She is unable to participate with PT evaluation secondary to headache pain. Will attempt PT evaluation on later time/date as pt is able to participate.  Sharalyn InkJason D Jillianne Gamino PT, DPT   Derry Arbogast 08/01/2017, 9:19 AM

## 2017-08-01 NOTE — Progress Notes (Signed)
Subjective: Right hand feels fine but she has severe nausea and headache.  BP elevated as well.  1 Day Post-Op Procedure(s) (LRB): OPEN REDUCTION INTERNAL FIXATION (ORIF) DISTAL RADIAL FRACTURE (Right)    Patient reports pain as mild in right hand.  csm good distally.  No sign of CTS  Objective:   VITALS:   Vitals:   08/01/17 0403 08/01/17 0855  BP: 128/62 (!) 167/63  Pulse: 81 84  Resp: 16 18  Temp: 98.4 F (36.9 C) 98 F (36.7 C)  SpO2: 97% 98%    Neurologically intact ABD soft Neurovascular intact Sensation intact distally Intact pulses distally Dorsiflexion/Plantar flexion intact Incision: no drainage  LABS  Recent Labs  07/31/17 1359  HGB 14.8  HCT 43.6  WBC 14.9*  PLT 267     Recent Labs  07/31/17 1359  NA 138  K 3.8  BUN 17  CREATININE 0.98  GLUCOSE 117*     Recent Labs  07/31/17 1601  INR 0.93     Assessment/Plan: 1 Day Post-Op Procedure(s) (LRB): OPEN REDUCTION INTERNAL FIXATION (ORIF) DISTAL RADIAL FRACTURE (Right)   Advance diet Up with therapy D/C IV fluids Plan for discharge tomorrow due to headache and nausea RTC 2-3 days

## 2017-08-01 NOTE — Progress Notes (Addendum)
Physical Therapy Treatment Patient Details Name: Lori Edwards MRN: [id number] DOB: 12-Feb-1929 Today's Date: 08/01/2017    History of Present Illness The patient tripped on a curb and fell on the right wrist and knee.  Brought to ER where exam and x-rays show a volarly displaced right distal radius fracture. The knee had a small abrasion only.  Orthopedic surgeon recommended ORIF of the fracture due to the high tendency of these to redisplace after closed reduction. Pt is now POD#1 s/p ORIF of radius fracture. PT attempted to see patient earlier in AM however she was having a significant headache with nausea and elevated BP. PT returned later in AM and pt is agreeable to walk with therapy but continues to report headache with nausea.     PT Comments    Pt demonstrates independence with bed mobility, transfers, and ambulation this afternoon. Gait speed has improved and is now [facility] for community mobility. She is able to perform horizontal and vertical head turns without staggering or LOB. Able to perform 180 degree turns and speed changes without instability. Denies DOE. No deficits noted with ambulation.She is able to ascend/descend 4 steps safely with alternating pattern and unilateral rail with supervision. Good strength/stabilty noted. No further PT needs during admission. Will complete order. Please enter new order if status or needs change.   Follow Up Recommendations  No PT follow up;Supervision - Intermittent;Other (comment) (Pt would like to use her respite days at [facility])     [employer];Other (comment) (Pt reports she can borrow from a friend)    Recommendations for Other Services OT consult     Precautions / Restrictions Precautions Precautions: Fall Restrictions Weight Bearing Restrictions: Yes RUE Weight Bearing: Non weight bearing    Mobility  Bed Mobility Overal bed mobility: Independent             General bed mobility comments: No use of  bed rails. Good speed/sequencing when exiting bed to the left  Transfers Overall transfer level: Independent Equipment used: None Transfers: Sit to/from Stand Sit to Stand: Independent         General transfer comment: Pt able to perform transfers from bed and commode independently without assist. Safe hand placement and good stability once in standing  Ambulation/Gait Ambulation/Gait assistance: Independent Ambulation Distance (Feet): 280 Feet Assistive device: None Gait Pattern/deviations: WFL(Within Functional Limits) Gait velocity: WFL for community mobility Gait velocity interpretation: at or above normal speed for age/gender General Gait Details: Pt demonstrates improved stability with ambulation this afternoon. Gait speed has improved and is now [facility] for community mobility. She is able to perform horizontal and vertical head turns without staggering or LOB. Able to perform 180 degree turns and speed changes without instability. Denies DOE. No deficits noted with ambulation.   Stairs Stairs: Yes   Stair Management: One rail Left;Alternating pattern Number of Stairs: 4 General stair comments: Pt able to ascend 4 steps with alternating pattern and unilateral L rail during ascend, R during descent. Good strength/stability noted.  Wheelchair Mobility    Modified Rankin (Stroke Patients Only)       Balance Overall balance assessment: Needs assistance Sitting-balance support: No upper extremity supported Sitting balance-Leahy Scale: Good     Standing balance support: No upper extremity supported Standing balance-Leahy Scale: Fair Standing balance comment: Able to maintain wide and narrow balance without UE support. Single leg balance is 6-8 seconds bilaterally this afternoon.  Cognition Arousal/Alertness: Awake/alert Behavior During Therapy: WFL for tasks assessed/performed Overall Cognitive Status: Within Functional Limits for  tasks assessed                                        Exercises      General Comments        Pertinent Vitals/Pain Pain Assessment: 0-10 Pain Score: 7  Pain Location: Headache pain. Pt denies any pain in her RUE Pain Intervention(s): Monitored during session    Home Living                      Prior Function            PT Goals (current goals can now be found in the care plan section) Acute Rehab PT Goals Patient Stated Goal: Return to prior level of function at home PT Goal Formulation: With patient Time For Goal Achievement: 08/15/17 Potential to Achieve Goals: Good Progress towards PT goals: Goals met/education completed, patient discharged from PT    Frequency    Other (Comment) (Discharge in house)      PT Plan Frequency needs to be updated;Other (comment) (discharge in house)    Co-evaluation              AM-PAC PT "6 Clicks" Daily Activity  Outcome Measure  Difficulty turning over in bed (including adjusting bedclothes, sheets and blankets)?: None Difficulty moving from lying on back to sitting on the side of the bed? : None Difficulty sitting down on and standing up from a chair with arms (e.g., wheelchair, bedside commode, etc,.)?: None Help needed moving to and from a bed to chair (including a wheelchair)?: None Help needed walking in hospital room?: None Help needed climbing 3-5 steps with a railing? : None 6 Click Score: 24    End of Session Equipment Utilized During Treatment: Gait belt Activity Tolerance: Patient tolerated treatment well Patient left: in bed;with call bell/phone within reach;with bed alarm set;Other (comment) (RUE elevated with ice pack) Nurse Communication: Mobility status PT Visit Diagnosis: History of falling (Z91.81);Unsteadiness on feet (R26.81);Muscle weakness (generalized) (M62.81)     Time: 5035-4656 PT Time Calculation (min) (ACUTE ONLY): 12 min  Charges:  $Gait Training: 8-22  mins                    G Codes:  Functional Assessment Tool Used: AM-PAC 6 Clicks Basic Mobility Functional Limitation: Mobility: Walking and moving around Mobility: Walking and Moving Around Current Status (C1275): 0 percent impaired, limited or restricted Mobility: Walking and Moving Around Goal Status (T7001): 0 percent impaired, limited or restricted Mobility: Walking and Moving Around Discharge Status [phone]): 0 percent impaired, limited or restricted    Phillips Grout PT, DPT     Braxtyn Bojarski 08/01/2017, 4:32 PM

## 2017-08-01 NOTE — Evaluation (Signed)
Physical Therapy Evaluation Patient Details Name: Lori Edwards MRN: [id number] DOB: 02/18/1929 Today's Date: 08/01/2017   History of Present Illness  The patient tripped on a curb and fell on the right wrist and knee.  Brought to ER where exam and x-rays show a volarly displaced right distal radius fracture. The knee had a small abrasion only.  Orthopedic surgeon recommended ORIF of the fracture due to the high tendency of these to redisplace after closed reduction. Pt is now POD#1 s/p ORIF of radius fracture. PT attempted to see patient earlier in AM however she was having a significant headache with nausea and elevated BP. PT returned later in AM and pt is agreeable to walk with therapy but continues to report headache with nausea.   Clinical Impression  Pt admitted with above diagnosis. Pt currently with functional limitations due to the deficits listed below (see PT Problem List).  Pt reports lightheadedness, nausea, and headache throughout PT session. She is modified independent with bed mobility and CGA only for transfers and ambulation. She is able to ambulate to bathroom and then complete a full lap around RN station. Initially when ambulating in the room pt is mildly unsteady however this improves with extended ambulation distance. By the end of the ambulation she completes her walk without cane and is actually more stable without an assistive device. Denies DOE with ambulation. Pt has no true PT needs at this time but she was reminded that she may be more unsteady when taking pain medication. Encouraged her to utilize single point cane as needed for added stability. She would benefit from OT evaluation during admission for education about bathing, dressing, and meal preparation with limited use of RUE. Pt reports that she would like to utilize her respite care days at [facility] and has LTC insurance which should help cover additional costs. This certainly seems like a prudent idea given the  limitations she will experience with self care related to RUE immobilization. Pt will benefit from PT services during admission to address deficits in strength, balance, and mobility in order to return to full function at home.       Follow Up Recommendations No PT follow up;Supervision - Intermittent;Other (comment) (Pt would like to use her respite days [facility])    [employer] Recommendations  Cane;Other (comment) (Pt reports she can borrow from a friend)    Recommendations for Other Services OT consult     Precautions / Restrictions Precautions Precautions: Fall Restrictions Weight Bearing Restrictions: Yes RUE Weight Bearing: Non weight bearing      Mobility  Bed Mobility Overal bed mobility: Modified Independent             General bed mobility comments: Requires slight increase in time to perform but not significant. HOB elevated and L bed rail utilized however when returning to bed pt does not require bed rail  Transfers Overall transfer level: Needs assistance Equipment used: None Transfers: Sit to/from Stand Sit to Stand: Min guard         General transfer comment: Pt initially mildly unsteady with transfers. She reports persistent headache, nausea, and lightheadedness but denies feeling presyncopal. Performed transfers from bed as well as commode. With extended time in standing balance improves  Ambulation/Gait Ambulation/Gait assistance: Min guard Ambulation Distance (Feet): 200 Feet Assistive device: Straight cane Gait Pattern/deviations: Decreased step length - right;Decreased step length - left Gait velocity: Decreased but functional for full household mobility Gait velocity interpretation: <1.8 ft/sec, indicative of risk for  recurrent falls General Gait Details: Pt is able to ambulate to bathroom and then complete a full lap around RN station. Initially when ambulating in the room pt is mildly unsteady however this improves with extended ambulation  distance. By the end of the ambulation she completes without cane and balance is better without assistive device. Denies DOE with ambulation. Education about how to sequence with spc in LUE.  Stairs            Wheelchair Mobility    Modified Rankin (Stroke Patients Only)       Balance Overall balance assessment: Needs assistance Sitting-balance support: No upper extremity supported Sitting balance-Leahy Scale: Good     Standing balance support: No upper extremity supported Standing balance-Leahy Scale: Fair Standing balance comment: Able to maintain wide and narrow balance without UE support. Positive [place]omberg for LOB. Single leg balance is 3-5 seconds bilaterally                             Pertinent Vitals/Pain Pain Assessment: 0-10 Pain Score: 8  Pain Location: Headache pain. Pt denies any pain in her RUE Pain Intervention(s): Monitored during session;Other (comment) (RN is aware)    Home Living Family/patient expects to be discharged to:: Other (Comment) (At [facility] in independent living apartment) Living Arrangements: Alone   Type of Home: Apartment (Independent living apartment at [facility]) Home Access: Level entry     Home Layout: One level Home Equipment: Shower seat (No )      Prior Function Level of Independence: Independent         Comments: Pt was previously independent with ADLs/IADLs. Driving and going to exercise classes. No prior falls. Lives at [facility] in independent living apartment. Prepares all her own meals     Hand Dominance   Dominant Hand: Right    Extremity/Trunk Assessment   Upper Extremity Assessment Upper Extremity Assessment: RUE deficits/detail RUE Deficits / Details: Pt reports intact sensation to light touch of fingers on R hand. Reports some tingling but no numbness. Able to perform functional AROM of R shoulder. LUE strength is grossly [facility]    Lower Extremity Assessment Lower Extremity Assessment:  Overall WFL for tasks assessed       Communication   Communication: No difficulties  Cognition Arousal/Alertness: Awake/alert Behavior During Therapy: WFL for tasks assessed/performed Overall Cognitive Status: Within Functional Limits for tasks assessed                                        General Comments      Exercises     Assessment/Plan    PT Assessment Patient needs continued PT services  PT Problem List Decreased strength;Decreased range of motion;Decreased balance;Decreased mobility;Pain       PT Treatment Interventions DME instruction;Gait training;Therapeutic activities;Therapeutic exercise;Balance training;Neuromuscular re-education;Patient/family education;Manual techniques    PT Goals (Current goals can be found in the Care Plan section)  Acute Rehab PT Goals Patient Stated Goal: Return to prior level of function at home PT Goal Formulation: With patient Time For Goal Achievement: 08/15/17 Potential to Achieve Goals: Good    Frequency BID   Barriers to discharge Decreased caregiver support Lives alone in independent living apartment at [facility]    Co-evaluation               AM-PAC PT "6 Clicks"  Daily Activity  Outcome Measure Difficulty turning over in bed (including adjusting bedclothes, sheets and blankets)?: A Little Difficulty moving from lying on back to sitting on the side of the bed? : A Little Difficulty sitting down on and standing up from a chair with arms (e.g., wheelchair, bedside commode, etc,.)?: A Little Help needed moving to and from a bed to chair (including a wheelchair)?: A Little Help needed walking in hospital room?: A Little Help needed climbing 3-5 steps with a railing? : A Little 6 Click Score: 18    End of Session Equipment Utilized During Treatment: Gait belt Activity Tolerance: Patient tolerated treatment well Patient left: in bed;with call bell/phone within reach;with bed alarm set;Other  (comment) (RUE elevated with ice pack) Nurse Communication: Mobility status PT Visit Diagnosis: History of falling (Z91.81);Muscle weakness (generalized) (M62.81);Unsteadiness on feet (R26.81)    Time: 1024-1050 PT Time Calculation (min) (ACUTE ONLY): 26 min   Charges:   PT Evaluation $PT Eval Low Complexity: 1 Low PT Treatments $Gait Training: 8-22 mins   PT G Codes:   PT G-Codes **NOT FOR INPATIENT CLASS** Functional Assessment Tool Used: AM-PAC 6 Clicks Basic Mobility Functional Limitation: Mobility: Walking and moving around Mobility: Walking and Moving Around Current Status (Z6109): At least 40 percent but less than 60 percent impaired, limited or restricted Mobility: Walking and Moving Around Goal Status [phone]): At least 1 percent but less than 20 percent impaired, limited or restricted    Lynnea Maizes PT, DPT    Anhthu Perdew 08/01/2017, 11:33 AM

## 2017-08-02 DIAGNOSIS — S52591A Other fractures of lower end of right radius, initial encounter for closed fracture: Secondary | ICD-10-CM | POA: Diagnosis not present

## 2017-08-02 MED ORDER — HYDROCODONE-ACETAMINOPHEN 5-325 MG PO TABS: 1.0000 | ORAL_TABLET | Freq: Four times a day (QID) | ORAL | 0 refills | Status: DC | PRN

## 2017-08-02 NOTE — Progress Notes (Signed)
Lori Edwards is A&Ox4. PODx2. To right arm. Ambulates with SBA to BR. Arm elevated and ice pack applied. Medicated x 1 for HA.

## 2017-08-02 NOTE — Clinical Social Work Placement (Addendum)
   CLINICAL SOCIAL WORK PLACEMENT  NOTE  Date:  08/02/2017  Patient Details  Name: Lori Edwards MRN: [id number] Date of Birth: 02/12/1929  Clinical Social Work is seeking post-discharge placement for this patient at the Skilled  Nursing Facility level of care (*CSW will initial, date and re-position this form in  chart as items are completed):  Yes   Patient/family provided with [place] Clinical Social Work Department's list of facilities offering this level of care within the geographic area requested by the patient (or if unable, by the patient's family).  Yes   Patient/family informed of their freedom to choose among providers that offer the needed level of care, that participate in Medicare, Medicaid or managed care program needed by the patient, have an available bed and are willing to accept the patient.  Yes   Patient/family informed of [place]'s ownership interest in [facility] and [facility], as well as of the fact that they are under no obligation to receive care at these facilities.  PASRR submitted to EDS on 08/01/17     PASRR number received on 08/01/17     Existing PASRR number confirmed on       FL2 transmitted to all facilities in geographic area requested by pt/family on 08/01/17     FL2 transmitted to all facilities within larger geographic area on       Patient informed that his/her managed care company has contracts with or will negotiate with certain facilities, including the following:        Yes   Patient/family informed of bed offers received.  Patient chooses bed at  [facility] )     Physician recommends and patient chooses bed at      Patient to be transferred to  [facility] ) on 08/02/17.  Patient to be transferred to facility by  (Patient's friend Natalia LeatherwoodKatherine will provide transport. )  Patient family notified on 08/02/17 of transfer.  Name of family member notified:   (CSW left patient's son Dennis Bastsh Bowie a Engineer, technical salesvoicemail. )  Patient's son Dennis Bastsh Bowie called CSW back and is agreement with D/C plan.   PHYSICIAN       Additional Comment:    _______________________________________________ Terisa Belardo, Darleen CrockerBailey M, LCSW 08/02/2017, 12:34 PM

## 2017-08-02 NOTE — Progress Notes (Addendum)
Patient is medically stable for D/C to [facility] today for respite care. Per Sue LushAndrea admissions coordinator at [facility] patient can come today to room 318. RN will call report at [phone]. Patient's friend Natalia LeatherwoodKatherine will provide transport. Clinical Child psychotherapistocial Worker (CSW) sent D/C orders to [facility] via [facility]. Patient is aware of above. CSW left patient's son Dennis Bastsh Bowie a voicemail. Please reconsult if future social work needs arise. CSW signing off.   Patient's son Dennis Bastsh Bowie called CSW and is in agreement with D/C plan.   Baker Hughes IncorporatedBailey Aberdeen Hafen, LCSW [phone]

## 2017-08-02 NOTE — Discharge Summary (Signed)
Physician Discharge Summary  Patient ID: Lori Edwards MRN: [id number] DOB/AGE: 81/05/1929 81 y.o.  Admit date: 07/31/2017 Discharge date: 08/02/2017  Admission Diagnoses: Displaced right distal radius fracture  Discharge Diagnoses: Same  Active Problems:   Radius and ulna distal fracture, right, closed, initial encounter   Right wrist fracture, closed, initial encounter   Discharged Condition: good  Hospital Course: Patient had little operative pain but had nausea and headache the day following surgery.  Discharge was delayed for that reason.  CSM remained good in right hand throughout.  Ready for d/c today  Consults: hospitalist  Significant Diagnostic Studies: radiology: X-Ray: good position of plate and fracture  Treatments: antibiotics: kefzol and cleocin  Discharge Exam: Blood pressure (!) 148/56, pulse 62, temperature 98.6 F (37 C), temperature source Oral, resp. rate 18, height 5' (1.524 m), weight 58.1 kg (128 lb), SpO2 93 %.   CSM good right hand.  dressing dry  Disposition: 01-Home or Self Care  Discharge Instructions    Call MD for:  persistant nausea and vomiting    Complete by:  As directed    Call MD for:  redness, tenderness, or signs of infection (pain, swelling, redness, odor or green/yellow discharge around incision site)    Complete by:  As directed    Call MD for:  severe uncontrolled pain    Complete by:  As directed    Call MD for:  temperature >100.4    Complete by:  As directed    Diet - low sodium heart healthy    Complete by:  As directed    Discharge instructions    Complete by:  As directed    Elevate operative arm at all times Move fingers aggressively RTC as appointed   Increase activity slowly    Complete by:  As directed    Leave dressing on - Keep it clean, dry, and intact until clinic visit    Complete by:  As directed      Allergies as of 08/02/2017   No Known Allergies     Medication List    TAKE these medications    amLODipine 5 MG tablet Commonly known as:  NORVASC Take 1 tablet (5 mg total) by mouth daily.   aspirin EC 81 MG tablet Take 81 mg by mouth daily.   Fish Oil 1000 MG Caps Take 1 capsule by mouth daily.   HYDROcodone-acetaminophen 5-325 MG tablet Commonly known as:  NORCO Take 1 tablet by mouth every 6 (six) hours as needed.   ONE-A-DAY ENERGY PO Take 1 tablet by mouth daily.   RED YEAST RICE PO Take 2 tablets by mouth daily.   SYSTANE OP Apply 1 drop to eye daily as needed. Dry Eyes       Contact information for follow-up providers    Deeann SaintMiller, Lori Paone, MD. Schedule an appointment as soon as possible for a visit in 2 day(s).   Specialty:  Specialist Contact information: [address] [place] [address] [phone]            Contact information for after-discharge care    Destination    [facility] Follow up.   Specialty:  Skilled Nursing Facility Contact information: [address] [place] [id number] [phone]                  Signed: Valinda HoarMILLER,Delphin Funes E 08/02/2017, 11:20 AM

## 2017-08-02 NOTE — Progress Notes (Signed)
Occupational Therapy Treatment Patient Details Name: Lori Edwards MRN: [id number] DOB: 03/13/1929 Today's Date: 08/02/2017    History of present illness Pt. is an 83 y.o. female who was [facility] to [facility] for ORIF repair of a volarly displaced right radius fracture. PMHx includes: Hyperlidiemia, HTN, and Thyroid Disease.   OT comments  Pt. is an 83 y.o. female who was admitted to [facility] for [facility]IF repair of a volarly displaced right radius fracture. Pt. required supervision for self-dressing using one armed dressing techniques for donning a Tee shirt, and pants. Pt. was independently able to complete toileting skills, and self-grooming in standing. Pt. education was provided about one armed dressing techniques. Pt. was able to demonstrate proper technique safely. Pt. education was provided verbally, and through visual demonstration. Pt. plans [facility]. Pt. Could benefit from outpatient hand therapy when appropriate.      Follow Up Recommendations   (Pt. plans to use use a few resp[facility] days [facility]    Equipment Recommendations       Recommendations for Other Services      Precautions / Restrictions Precautions Precautions: Fall Restrictions Weight Bearing Restrictions: Yes RUE Weight Bearing: Non weight bearing       Mobility Bed Mobility Overal bed mobility: Independent                Transfers Overall transfer level: Independent                                                               ADL either performed or assessed with clinical judgement   ADL Overall ADL's : Needs assistance/impaired Eating/Feeding: Set up Eating/Feeding Details (indicate cue type and reason): using left hand Grooming: Independent;Standing Grooming Details (indicate cue type and reason): using the left hand         Upper Body Dressing : Supervision/safety (Using one armed dressing techniques)   Lower Body  Dressing: Supervision/safety   Toilet Transfer: Supervision/safety   Toileting- Clothing Manipulation and Hygiene: Independent       Functional mobility during ADLs: Supervision/safety General ADL Comments: Pt. education was provided about one-armed dressing techniques, and strategies provided verbally, and visual demonstration. Pt. was able to demonstrate knowledge.     Vision Baseline Vision/History: Wears glasses Wears Glasses: At all times Patient Visual Report: No change from baseline     Perception     Praxis      Cognition Arousal/Alertness: Awake/alert Behavior During Therapy: WFL for tasks assessed/performed Overall Cognitive Status: Within Functional Limits for tasks assessed                                          Exercises     Shoulder Instructions       General Comments      Pertinent Vitals/ Pain       Pain Assessment: No/denies pain Pain Intervention(s): Ice applied;Repositioned  Home Living                                          Prior Functioning/Environment  Residing at [facility], independent living, and active in community. Pt. was on her way to a Zumba class when she fell.            Frequency  Min 1X/week        Progress Toward Goals  OT Goals(current goals can now be found in the care plan section)  Progress towards OT goals: Progressing toward goals  Acute Rehab OT Goals Patient Stated Goal: Return to prior level of function at home OT Goal Formulation: With patient Time For Goal Achievement: 08/08/17 Potential to Achieve Goals: Good  Plan Discharge plan remains appropriate    Co-evaluation                 AM-PAC PT "6 Clicks" Daily Activity     Outcome Measure   Help from another person eating meals?: None Help from another person taking care of personal grooming?: None Help from another person toileting, which includes using toliet, bedpan, or urinal?: None Help from  another person bathing (including washing, rinsing, drying)?: A Little Help from another person to put on and taking off regular upper body clothing?: A Little Help from another person to put on and taking off regular lower body clothing?: A Little 6 Click Score: 21    End of Session    OT Visit Diagnosis: Muscle weakness (generalized) (M62.81)   Activity Tolerance Patient tolerated treatment well   Patient Left in bed;with call bell/phone within reach;with bed alarm set;Other (comment)   Nurse Communication      Functional Assessment Tool Used: AM-PAC 6 Clicks Daily Activity;Clinical judgement Functional Limitation: Self care Self Care Current Status [phone]): At least 20 percent but less than 40 percent impaired, limited or restricted Self Care Goal Status (U0454): At least 1 percent but less than 20 percent impaired, limited or restricted   Time: 0915-0933 OT Time Calculation (min): 18 min  Charges: OT G-codes **NOT FOR INPATIENT CLASS** Functional Assessment Tool Used: AM-PAC 6 Clicks Daily Activity;Clinical judgement Functional Limitation: Self care Self Care Current Status (U9811): At least 20 percent but less than 40 percent impaired, limited or restricted Self Care Goal Status (B1478): At least 1 percent but less than 20 percent impaired, limited or restricted OT General Charges $OT Visit: 1 Procedure OT Treatments $Self Care/Home Management : 8-22 mins  Olegario Messier, MS, OTR/L  Olegario Messier, MS, OTR/L 08/02/2017, 9:47 AM

## 2017-08-02 NOTE — Progress Notes (Signed)
Report called Lori Edwards at [facility]. Pts friend transporting pt.

## 2017-08-02 NOTE — Progress Notes (Signed)
Pts friend here to transport pt.

## 2017-08-02 NOTE — Progress Notes (Signed)
Subjective:Feels much better today.  Ready to go home.  Will go to health care facility [facility] RTC 2 days  2 Days Post-Op Procedure(s) (LRB): OPEN REDUCTION INTERNAL FIXATION (ORIF) DISTAL RADIAL FRACTURE (Right)    Patient reports pain as mild.  Objective:  Looks good and csm good right hand.   Dressing dry.  VITALS:   Vitals:   08/02/17 0548 08/02/17 0808  BP: (!) 147/59 (!) 148/56  Pulse: 66 62  Resp: 20 18  Temp: 97.8 F (36.6 C) 98.6 F (37 C)  SpO2: 96% 93%    Neurologically intact ABD soft Neurovascular intact Sensation intact distally Intact pulses distally Dorsiflexion/Plantar flexion intact Incision: dressing C/D/I  LABS  Recent Labs  07/31/17 1359  HGB 14.8  HCT 43.6  WBC 14.9*  PLT 267     Recent Labs  07/31/17 1359  NA 138  K 3.8  BUN 17  CREATININE 0.98  GLUCOSE 117*     Recent Labs  07/31/17 1601  INR 0.93     Assessment/Plan: 2 Days Post-Op Procedure(s) (LRB): OPEN REDUCTION INTERNAL FIXATION (ORIF) DISTAL RADIAL FRACTURE (Right)   Discharge to SNF today RTC 2 days

## 2017-08-03 ENCOUNTER — Telehealth: Payer: Self-pay | Admitting: Family Medicine

## 2017-08-03 NOTE — Telephone Encounter (Signed)
Patient discharged [facility] will continue to follow for release for TCM and hospital follow up for Fall, and nondisplaced fracture of styloid process of right ulna and closed fracture of distal end of right radius. FYI

## 2017-08-04 DIAGNOSIS — S52531A Colles' fracture of right radius, initial encounter for closed fracture: Secondary | ICD-10-CM | POA: Diagnosis not present

## 2017-08-07 ENCOUNTER — Other Ambulatory Visit: Payer: Self-pay | Admitting: Family Medicine

## 2017-08-08 NOTE — Anesthesia Postprocedure Evaluation (Signed)
Anesthesia Post Note  Patient: Lori Edwards  Procedure(s) Performed: Procedure(s) (LRB): OPEN REDUCTION INTERNAL FIXATION (ORIF) DISTAL RADIAL FRACTURE (Right)  Patient location during evaluation: PACU Anesthesia Type: General Level of consciousness: awake and alert Pain management: pain level controlled Vital Signs Assessment: post-procedure vital signs reviewed and stable Respiratory status: spontaneous breathing, nonlabored ventilation, respiratory function stable and patient connected to nasal cannula oxygen Cardiovascular status: blood pressure returned to baseline and stable Postop Assessment: no signs of nausea or vomiting Anesthetic complications: no     Last Vitals:  Vitals:   08/02/17 0808 08/02/17 1314  BP: (!) 148/56 (!) 143/62  Pulse: 62 85  Resp: 18 18  Temp: 37 C 36.6 C  SpO2: 93% 95%    Last Pain:  Vitals:   08/02/17 1314  TempSrc: Oral  PainSc:                  [name]

## 2017-08-18 DIAGNOSIS — S52531A Colles' fracture of right radius, initial encounter for closed fracture: Secondary | ICD-10-CM | POA: Diagnosis not present

## 2017-08-24 ENCOUNTER — Ambulatory Visit: Payer: Medicare Other | Admitting: Family Medicine

## 2017-08-30 ENCOUNTER — Ambulatory Visit: Payer: Medicare Other | Admitting: Family Medicine

## 2017-09-07 ENCOUNTER — Ambulatory Visit (INDEPENDENT_AMBULATORY_CARE_PROVIDER_SITE_OTHER): Payer: Medicare Other | Admitting: Family Medicine

## 2017-09-07 ENCOUNTER — Encounter: Payer: Self-pay | Admitting: Family Medicine

## 2017-09-07 DIAGNOSIS — S62101A Fracture of unspecified carpal bone, right wrist, initial encounter for closed fracture: Secondary | ICD-10-CM | POA: Diagnosis not present

## 2017-09-07 DIAGNOSIS — E785 Hyperlipidemia, unspecified: Secondary | ICD-10-CM | POA: Diagnosis not present

## 2017-09-07 DIAGNOSIS — I10 Essential (primary) hypertension: Secondary | ICD-10-CM

## 2017-09-07 DIAGNOSIS — Z23 Encounter for immunization: Secondary | ICD-10-CM

## 2017-09-07 NOTE — Patient Instructions (Signed)
Nice to see you. Please let your surgeon know about the tingling in your right thumb. Please keep follow-up with them. Please continue to monitor your blood pressure periodically.

## 2017-09-07 NOTE — Assessment & Plan Note (Addendum)
Well-controlled. Continue current medication. She'll monitor her blood pressure at home and if it starts to increase she will call us for follow-up sooner than 1 year.

## 2017-09-07 NOTE — Assessment & Plan Note (Signed)
Status post ORIF. Following with orthopedics. Benign exam today and appears to be progressing well. Discussed letting her surgeon know regarding the tingling in her right thumb. She'll continue to monitor.

## 2017-09-07 NOTE — Assessment & Plan Note (Signed)
Continue diet and exercise. Continue fish oil and red yeast rice.

## 2017-09-07 NOTE — Progress Notes (Signed)
  Marikay Alar, MD Phone: [phone]  Lori Edwards is a 83 y.o. female who presents today for follow-up.  Hypertension: Typically in the 120s-130 systolically. Taking amlodipine. No chest pain or shortness breath. No edema.  Hyperlipidemia: Taking omega-3 Fish oils and red yeast rice. No chest pain or claudication.  Since her last visit the patient sustained a right wrist fracture. She was hospitalized and underwent open reduction and internal fixation. She has followed up with the surgeon. She notes she tripped and fell on her way to Zumba. She has done okay since then. Slight discomfort at times. Has not started PT had. Notes some slight numbness and tingling in her right thumb at times.  PMH: nonsmoker.   ROS see history of present illness  Objective  Physical Exam Vitals:   09/07/17 0956  BP: 140/68  Pulse: 61  Temp: 98 F (36.7 C)  SpO2: 97%    BP Readings from Last 3 Encounters:  09/07/17 140/68  08/02/17 (!) 143/62  02/22/17 138/82   Wt Readings from Last 3 Encounters:  09/07/17 127 lb 6.4 oz (57.8 kg)  07/31/17 128 lb (58.1 kg)  02/22/17 132 lb 3.2 oz (60 kg)    Physical Exam  Constitutional: No distress.  Cardiovascular: Normal rate, regular rhythm and normal heart sounds.   Pulmonary/Chest: Effort normal and breath sounds normal.  Musculoskeletal: She exhibits no edema.  Right breast with volar aspect scar that is well-healed with no signs of infection, slight swelling, 4+/5 strength right grip, sensation to light touch intact right hand, good capillary refill, 2+ radial pulse, left hand with 5/5 strength left grip, sensation light touch intact, good capillary refill, 2+ radial pulse  Neurological: She is alert. Gait normal.  Skin: Skin is warm and dry. She is not diaphoretic.     Assessment/Plan: Please see individual problem list.  Essential hypertension Well-controlled. Continue current medication. She'll monitor her blood pressure at home  and if it starts to increase she will call us for follow-up sooner than 1 year.  Hyperlipidemia Continue diet and exercise. Continue fish oil and red yeast rice.  Right wrist fracture, closed, initial encounter Status post ORIF. Following with orthopedics. Benign exam today and appears to be progressing well. Discussed letting her surgeon know regarding the tingling in her right thumb. She'll continue to monitor.   Orders Placed This Encounter  Procedures  . Flu vaccine HIGH DOSE PF   Marikay Alar, MD [facility]

## 2017-09-11 ENCOUNTER — Telehealth: Payer: Self-pay | Admitting: Family Medicine

## 2017-09-11 NOTE — Telephone Encounter (Signed)
Left pt message asking to call Revonda Standard back directly at [phone] to schedule AWV. Thanks!  *NOTE* Last AWV 03/23/16

## 2017-09-15 DIAGNOSIS — S52531A Colles' fracture of right radius, initial encounter for closed fracture: Secondary | ICD-10-CM | POA: Diagnosis not present

## 2017-09-21 DIAGNOSIS — S52531D Colles' fracture of right radius, subsequent encounter for closed fracture with routine healing: Secondary | ICD-10-CM | POA: Diagnosis not present

## 2017-10-05 DIAGNOSIS — S52531D Colles' fracture of right radius, subsequent encounter for closed fracture with routine healing: Secondary | ICD-10-CM | POA: Diagnosis not present

## 2017-10-10 DIAGNOSIS — S52531D Colles' fracture of right radius, subsequent encounter for closed fracture with routine healing: Secondary | ICD-10-CM | POA: Diagnosis not present

## 2017-10-12 DIAGNOSIS — S52531D Colles' fracture of right radius, subsequent encounter for closed fracture with routine healing: Secondary | ICD-10-CM | POA: Diagnosis not present

## 2017-10-19 DIAGNOSIS — S52531D Colles' fracture of right radius, subsequent encounter for closed fracture with routine healing: Secondary | ICD-10-CM | POA: Diagnosis not present

## 2017-11-02 DIAGNOSIS — S52531D Colles' fracture of right radius, subsequent encounter for closed fracture with routine healing: Secondary | ICD-10-CM | POA: Diagnosis not present

## 2017-11-04 ENCOUNTER — Other Ambulatory Visit: Payer: Self-pay | Admitting: Family Medicine

## 2018-02-03 ENCOUNTER — Other Ambulatory Visit: Payer: Self-pay | Admitting: Family Medicine

## 2018-04-30 DIAGNOSIS — H2513 Age-related nuclear cataract, bilateral: Secondary | ICD-10-CM | POA: Diagnosis not present

## 2018-05-06 ENCOUNTER — Other Ambulatory Visit: Payer: Self-pay | Admitting: Family Medicine

## 2018-05-18 IMAGING — DX DG WRIST COMPLETE 3+V*R*
3 series · 3 of 3 positions shown · non-contrast
Comparison: None.

CLINICAL DATA: Fall.  Initial encounter.

EXAM:
RIGHT WRIST - COMPLETE 3+ VIEW

[wrist ap]
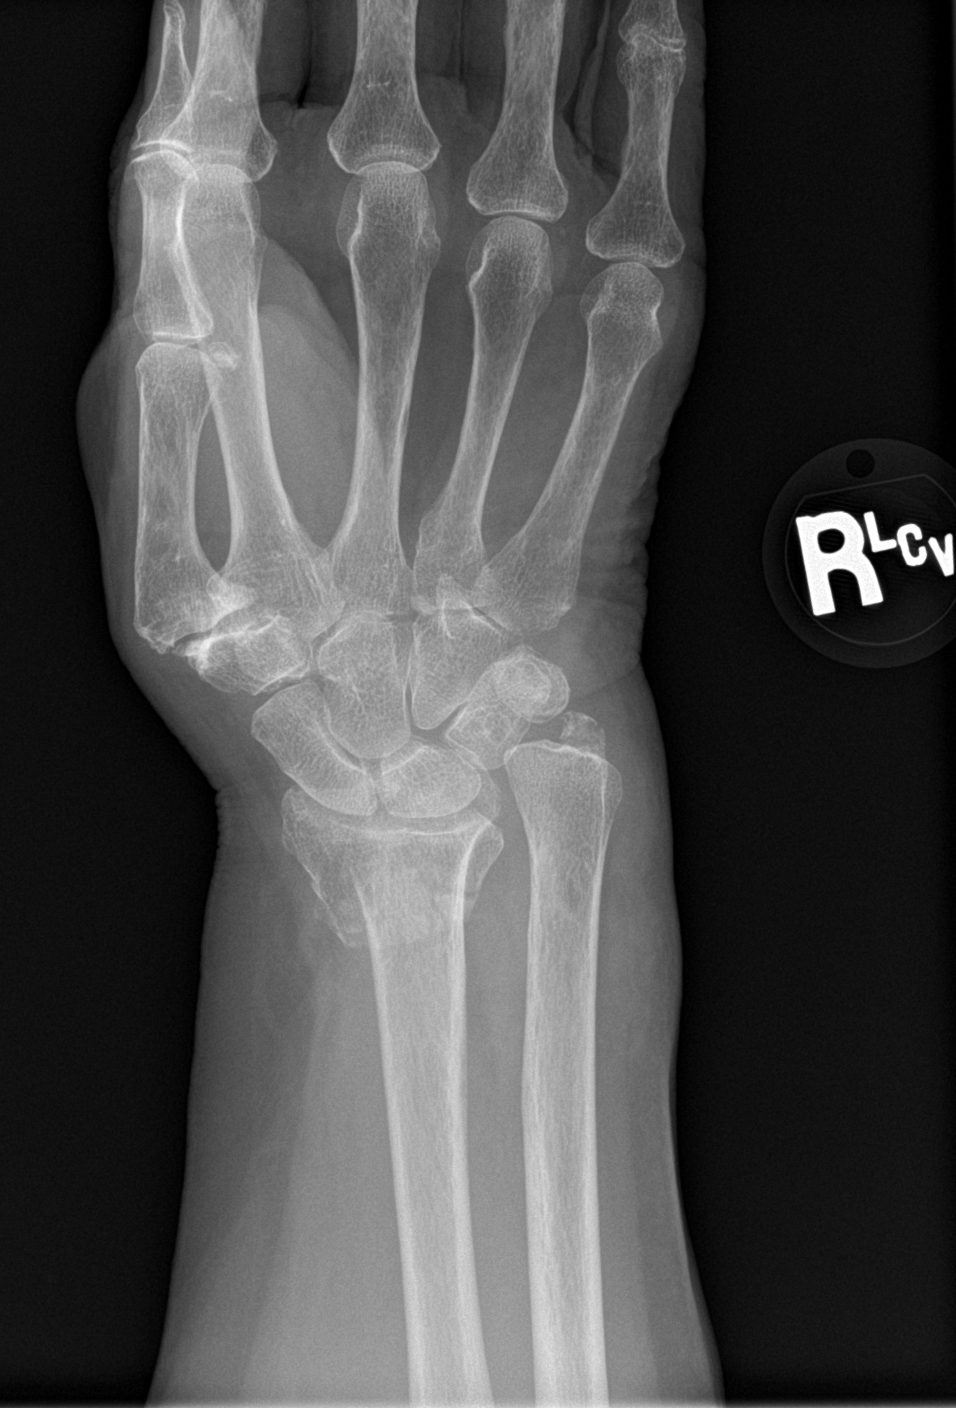

[wrist obl]
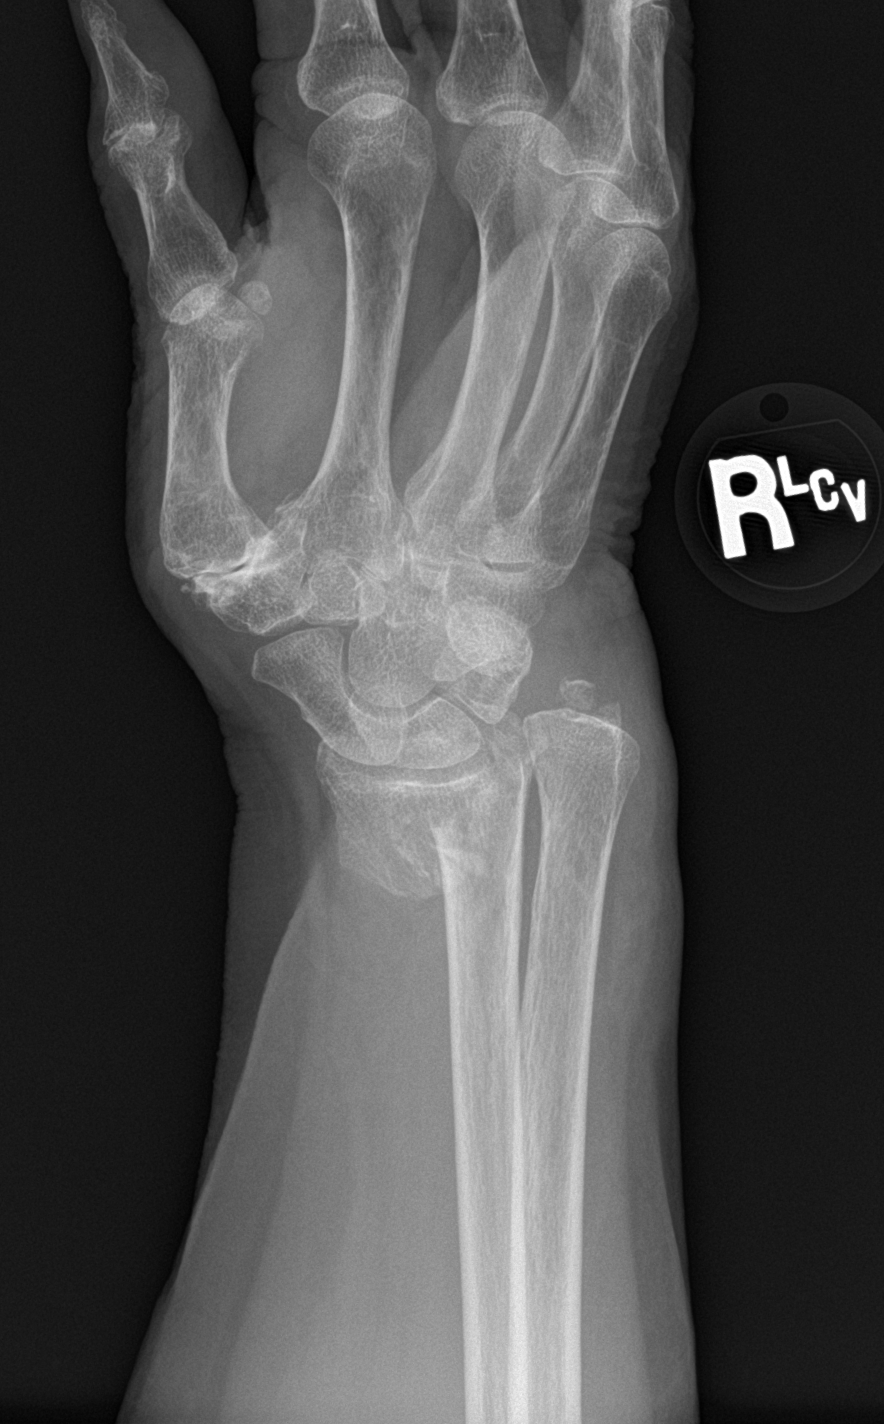

[wrist lat]
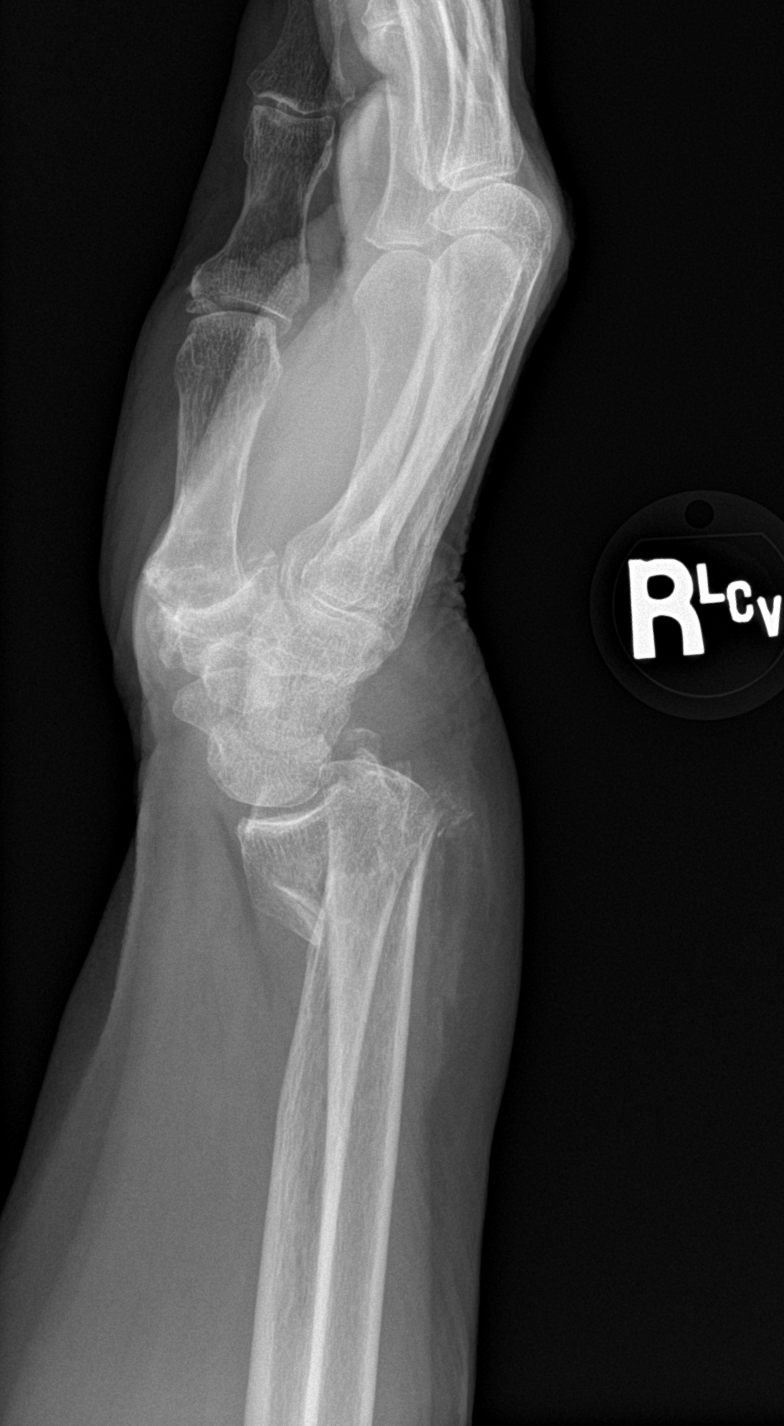

[3 of 3 positions shown; findings below may reference images not displayed]

FINDINGS: There is a transverse fracture of the distal radial metaphysis with
nearly 100% ventral displacement. Ventral impaction with ulnar
positive variance. Nondisplaced ulnar styloid fracture.

First CMC osteoarthritis.  Osteopenia.
IMPRESSION: 1. Distal radial metaphysis fracture with volar displacement and
impaction.
2. Nondisplaced ulnar styloid fracture.

## 2018-07-12 ENCOUNTER — Ambulatory Visit (INDEPENDENT_AMBULATORY_CARE_PROVIDER_SITE_OTHER): Payer: Medicare Other

## 2018-07-12 VITALS — BP 148/70 | HR 80 | Temp 97.9°F | Resp 14 | Ht 59.0 in | Wt 125.4 lb

## 2018-07-12 DIAGNOSIS — Z0000 Encounter for general adult medical examination without abnormal findings: Secondary | ICD-10-CM | POA: Diagnosis not present

## 2018-07-12 NOTE — Patient Instructions (Addendum)
  Lori Edwards , Thank you for taking time to come for your Medicare Wellness Visit. I appreciate your ongoing commitment to your health goals. Please review the following plan we discussed and let me know if I can assist you in the future.   Record blood pressures and bring them with you to your next appointment.   These are the goals we discussed: Goals    . DIET - INCREASE WATER INTAKE    . Lose 5lbs     Portion control       This is a list of the screening recommended for you and due dates:  Health Maintenance  Topic Date Due  . Tetanus Vaccine  01/24/1950  . Pneumonia vaccines (1 of 2 - PCV13) 01/25/1996  . Flu Shot  07/19/2018  . DEXA scan (bone density measurement)  Completed

## 2018-07-12 NOTE — Progress Notes (Signed)
Subjective:   Lori Edwards is a 84 y.o. female who presents for an Initial Medicare Annual Wellness Visit.  Review of Systems    No ROS.  Medicare Wellness Visit. Additional risk factors are reflected in the social history.   Cardiac Risk Factors include: advanced age (>36men, >50 women);hypertension     Objective:    Today's Vitals   07/12/18 0912  BP: (!) 148/70  Pulse: 80  Resp: 14  Temp: 97.9 F (36.6 C)  TempSrc: Oral  SpO2: 98%  Weight: 125 lb 6.4 oz (56.9 kg)  Height: 4\' 11"  (1.499 m)   Body mass index is 25.33 kg/m.  Advanced Directives 07/12/2018 07/31/2017 07/31/2017  Does Patient Have a Medical Advance Directive? Yes No No  Type of Estate agent of [place];Living will - -  Does patient want to make changes to medical advance directive? No - Patient declined - -  Copy of Healthcare Power of Attorney in Chart? No - copy requested - -  Would patient like information on creating a medical advance directive? - No - Patient declined No - Patient declined    Current Medications (verified) Outpatient Encounter Medications as of 07/12/2018  Medication Sig  . amLODipine (NORVASC) 5 MG tablet TAKE 1 TABLET BY MOUTH EVERY DAY  . aspirin EC 81 MG tablet Take 81 mg by mouth daily.  . Multiple Vitamins-Minerals (ONE-A-DAY ENERGY PO) Take 1 tablet by mouth daily.  . Omega-3 Fatty Acids (FISH OIL) 1000 MG CAPS Take 1 capsule by mouth daily.  Bertram Gala Glycol-Propyl Glycol (SYSTANE OP) Apply 1 drop to eye daily as needed. Dry Eyes  . Red Yeast Rice Extract (RED YEAST RICE PO) Take 2 tablets by mouth daily.   No facility-administered encounter medications on file as of 07/12/2018.     Allergies (verified) Patient has no known allergies.   History: Past Medical History:  Diagnosis Date  . Hyperlipidemia   . Hypertension   . Thyroid disease    Past Surgical History:  Procedure Laterality Date  . OPEN REDUCTION INTERNAL FIXATION (ORIF)  DISTAL RADIAL FRACTURE Right 07/31/2017   Procedure: OPEN REDUCTION INTERNAL FIXATION (ORIF) DISTAL RADIAL FRACTURE;  Surgeon: Deeann Saint, MD;  Location: [facility] ORS;  Service: Orthopedics;  Laterality: Right;   Family History  Problem Relation Age of Onset  . Stroke Other        Parent  . Stroke Mother   . Hypertension Mother   . Hypertension Father    Social History   Socioeconomic History  . Marital status: Widowed    Spouse name: Not on file  . Number of children: Not on file  . Years of education: Not on file  . Highest education level: Not on file  Occupational History  . Not on file  Social Needs  . Financial resource strain: Not hard at all  . Food insecurity:    Worry: Never true    Inability: Never true  . Transportation needs:    Medical: No    Non-medical: No  Tobacco Use  . Smoking status: Never Smoker  . Smokeless tobacco: Never Used  Substance and Sexual Activity  . Alcohol use: No  . Drug use: No  . Sexual activity: Not on file  Lifestyle  . Physical activity:    Days per week: 5 days    Minutes per session: 60 min  . Stress: Not at all  Relationships  . Social connections:    Talks on phone: Not on  file    Gets together: Not on file    Attends religious service: Not on file    Active member of club or organization: Not on file    Attends meetings of clubs or organizations: Not on file    Relationship status: Not on file  Other Topics Concern  . Not on file  Social History Narrative  . Not on file    Tobacco Counseling Counseling given: Not Answered   Clinical Intake:  Pre-visit preparation completed: Yes  Pain : No/denies pain     Nutritional Status: BMI 25 -29 Overweight Diabetes: No  How often do you need to have someone help you when you read instructions, pamphlets, or other written materials from your doctor or pharmacy?: 1 - Never  Interpreter Needed?: No      Activities of Daily Living In your present state of  health, do you have any difficulty performing the following activities: 07/12/2018 07/31/2017  Hearing? N N  Vision? N N  Difficulty concentrating or making decisions? N N  Walking or climbing stairs? N N  Dressing or bathing? N Y  Doing errands, shopping? N N  Preparing Food and eating ? N -  Using the Toilet? N -  In the past six months, have you accidently leaked urine? Y -  Comment Managed with a daily liner -  Do you have problems with loss of bowel control? N -  Managing your Medications? N -  Managing your Finances? N -  Housekeeping or managing your Housekeeping? N -  Some recent data might be hidden     Immunizations and Health Maintenance Immunization History  Administered Date(s) Administered  . Influenza, High Dose Seasonal PF 08/25/2016, 09/07/2017  . Pneumococcal Conjugate-13 08/03/2017   Health Maintenance Due  Topic Date Due  . TETANUS/TDAP  01/24/1948  . PNA vac Low Risk Adult (1 of 2 - PCV13) 01/25/1996    Patient Care Team: Glori Luis, MD as PCP - General (Family Medicine)  Indicate any recent Medical Services you may have received from other than [facility] providers in the past year (date may be approximate).     Assessment:   This is a routine wellness examination for Lori Edwards. The goal of the wellness visit is to assist the patient how to close the gaps in care and create a preventative care plan for the patient.   The roster of all physicians providing medical care to patient is listed in the Snapshot section of the chart.  Osteoporosis risk reviewed.    Safety issues reviewed; Resides at [facility]. Smoke and carbon monoxide detectors in the home. No firearms in the home. Wears seatbelts when driving or riding with others. No violence in the home.  They do not have excessive sun exposure.  Discussed the need for sun protection: hats, long sleeves and the use of sunscreen if there is significant sun exposure.  Patient is alert, normal  appearance, oriented to person/place/and time. Correctly identified the president of the [place] and recalls of 2/3 words.  Performs simple calculations and can read correct time from watch face. Displays appropriate judgement.  No new identified risk were noted.  No failures at ADL's or IADL's.    BMI- discussed the importance of a healthy diet, water intake and the benefits of aerobic exercise. She tries to eat a healthy diet and plans to focus on portion size.  She exercises with zumba and line dancing 300 minutes and plans to increase her water intake.  24 hour diet recall: Regular diet  Dental- every 3 months.  Sleep patterns- Sleeps 7-8 hours at night.    TDAP vaccine deferred per patient preference.  Follow up with insurance.  Educational material provided.  HTN-followed by pcp.  States she checks blood pressure at home and averages 140/70 after taking medication in the morning.  Medication taken at 7am today. Reports seeing flashing lights more than once while reading a book within the last week. She is unsure if it is vision or blood pressure related. Family Hx, mother with stroke, both parents HTN.   States she is having no issues with vision; last visit 03/2018.  Encouraged her to take and record her numbers upon waking and throughout the day; bring to next visit. Denies blurred vision, chest pain, shortness of breath, dizziness.  Follow up appointment scheduled with pcp.   Hearing/Vision screen Hearing Screening Comments: Patient is able to hear conversational tones without difficulty.  No issues reported.  Vision Screening Comments: Followed by [facility] (Dr. Brooke DareKing) Wears corrective lenses when reading and driving Last OV 16/109604/2019 Visual acuity not assessed per patient preference since they have regular follow up with the ophthalmologist  Dietary issues and exercise activities discussed: Current Exercise Habits: Structured exercise class, Type of exercise: stretching(line  dancing and zumba), Time (Minutes): 60, Frequency (Times/Week): 5, Weekly Exercise (Minutes/Week): 300, Intensity: Moderate  Goals    . DIET - INCREASE WATER INTAKE    . Lose 5lbs     Portion control      Depression Screen PHQ 2/9 Scores 07/12/2018 02/22/2017  PHQ - 2 Score 0 0    Fall Risk Fall Risk  07/12/2018 02/22/2017  Falls in the past year? No No   Cognitive Function: MMSE - Mini Mental State Exam 07/12/2018  Orientation to time 5  Orientation to Place 5  Registration 3  Attention/ Calculation 5  Recall 3  Language- name 2 objects 2  Language- repeat 1  Language- follow 3 step command 3  Language- read & follow direction 1  Write a sentence 1  Copy design 1  Total score 30        Screening Tests Health Maintenance  Topic Date Due  . TETANUS/TDAP  01[birth year]24/1950  . PNA vac Low Risk Adult (1 of 2 - PCV13) 01/25/1996  . INFLUENZA VACCINE  07/19/2018  . DEXA SCAN  Completed       Plan:    End of life planning; Advance aging; Advanced directives discussed. Copy of current HCPOA/Living Will requested.    I have personally reviewed and noted the following in the patient's chart:   . Medical and social history . Use of alcohol, tobacco or illicit drugs  . Current medications and supplements . Functional ability and status . Nutritional status . Physical activity . Advanced directives . List of other physicians . Hospitalizations, surgeries, and ER visits in previous 12 months . Vitals . Screenings to include cognitive, depression, and falls . Referrals and appointments  In addition, I have reviewed and discussed with patient certain preventive protocols, quality metrics, and best practice recommendations. A written personalized care plan for preventive services as well as general preventive health recommendations were provided to patient.     Ashok PallOBrien-Blaney, Ziquan Fidel L, LPN   0/45/40987/25/2019

## 2018-07-13 ENCOUNTER — Telehealth: Payer: Self-pay | Admitting: Family Medicine

## 2018-07-13 NOTE — Telephone Encounter (Signed)
-----   Message from Varney Bilesenisa L O'Brien-Blaney, LPN sent at 5/78/46967/26/2019 11:49 AM EDT ----- Before I call the patient regarding her ophthalmologist- is it OK for her to cancel the appointment with you on Monday for HTN, and continue to monitor at home?  If HTN worsens or changes then schedule.   I think she was just taking precaution once the flashing began.    Thanks, Denisa  ----- Message ----- From: Glori LuisSonnenberg, Catheleen Langhorne G, MD Sent: 07/13/2018   8:28 AM To: Varney Bilesenisa L O'Brien-Blaney, LPN  Please see my attestation. Patient needs to see ophthalmology for the flashing lights she saw. Thanks. Phila [place]af.

## 2018-07-13 NOTE — Telephone Encounter (Signed)
I attempted to contact the patient regarding her symptoms.  There is no answer.  Please relay to her that she should keep her appointment with me regarding her blood pressure.  She additionally needs to see her ophthalmologist as outlined in my attestation to your note.

## 2018-07-23 ENCOUNTER — Ambulatory Visit (INDEPENDENT_AMBULATORY_CARE_PROVIDER_SITE_OTHER): Payer: Medicare Other | Admitting: Family Medicine

## 2018-07-23 ENCOUNTER — Encounter: Payer: Self-pay | Admitting: Family Medicine

## 2018-07-23 DIAGNOSIS — I10 Essential (primary) hypertension: Secondary | ICD-10-CM

## 2018-07-23 NOTE — Assessment & Plan Note (Addendum)
Improved on recheck.  Suspect prior slight elevation related to dietary indiscretion.  She will continue to monitor.  We will get her set up with her eye doctor for an evaluation given the prior flashing lights.  She is given return precautions regarding this.

## 2018-07-23 NOTE — Progress Notes (Signed)
  Marikay AlarEric Sonnenberg, MD Phone: [phone]  Lori Edwards is a 84 y.o. female who presents today for f/u.  CC: htn, flashing lights  HYPERTENSION  Disease Monitoring  Home BP Monitoring 111-138/63-77 Chest pain- no    Dyspnea- no Medications  Compliance-  Taking amlodipine.  Edema- no Patient reports that a day or 2 prior to her visit with the health coach she had been eating anchovy pizza and feels that may have altered her blood pressure.  She reported the night before she came in to be seen about 2 weeks ago she had flashing lights in her vision that lasted for a few seconds and went away.  No blurry vision.  They have not recurred.  She has not seen her eye doctor.    Social History   Tobacco Use  Smoking Status Never Smoker  Smokeless Tobacco Never Used     ROS see history of present illness  Objective  Physical Exam Vitals:   07/23/18 1120 07/23/18 1134  BP: (!) 160/80 140/78  Pulse: 67   Temp: 97.9 F (36.6 C)   SpO2: 96%     BP Readings from Last 3 Encounters:  07/23/18 140/78  07/12/18 (!) 148/70  09/07/17 140/68   Wt Readings from Last 3 Encounters:  07/23/18 123 lb 3.2 oz (55.9 kg)  07/12/18 125 lb 6.4 oz (56.9 kg)  09/07/17 127 lb 6.4 oz (57.8 kg)    Physical Exam  Constitutional: No distress.  Eyes: Pupils are equal, round, and reactive to light. Conjunctivae and EOM are normal.  Visual fields intact  Cardiovascular: Normal rate, regular rhythm and normal heart sounds.  Pulmonary/Chest: Effort normal and breath sounds normal.  Musculoskeletal: She exhibits no edema.  Neurological: She is alert.  Skin: Skin is warm and dry. She is not diaphoretic.     Assessment/Plan: Please see individual problem list.  Essential hypertension Improved on recheck.  Suspect prior slight elevation related to dietary indiscretion.  She will continue to monitor.  We will get her set up with her eye doctor for an evaluation given the prior flashing lights.  She  is given return precautions regarding this. Referral coordinator will get the patient an appointment with [facility].  No orders of the defined types were placed in this encounter.   No orders of the defined types were placed in this encounter.    Marikay AlarEric Sonnenberg, MD [facility] [place]

## 2018-07-23 NOTE — Patient Instructions (Signed)
Nice to see you. Please continue to monitor your blood pressure and take your blood pressure medication.  If it starts to go up please let us know. We will get you to see your eye doctor for a vision exam.

## 2018-07-25 DIAGNOSIS — H2513 Age-related nuclear cataract, bilateral: Secondary | ICD-10-CM | POA: Diagnosis not present

## 2018-08-02 ENCOUNTER — Other Ambulatory Visit: Payer: Self-pay | Admitting: Family Medicine

## 2018-09-10 ENCOUNTER — Ambulatory Visit (INDEPENDENT_AMBULATORY_CARE_PROVIDER_SITE_OTHER): Payer: Medicare Other | Admitting: Family Medicine

## 2018-09-10 ENCOUNTER — Encounter: Payer: Self-pay | Admitting: Family Medicine

## 2018-09-10 VITALS — BP 138/72 | HR 64 | Temp 98.3°F | Ht 60.0 in | Wt 121.8 lb

## 2018-09-10 DIAGNOSIS — D72829 Elevated white blood cell count, unspecified: Secondary | ICD-10-CM

## 2018-09-10 DIAGNOSIS — E785 Hyperlipidemia, unspecified: Secondary | ICD-10-CM | POA: Diagnosis not present

## 2018-09-10 DIAGNOSIS — Z23 Encounter for immunization: Secondary | ICD-10-CM | POA: Diagnosis not present

## 2018-09-10 DIAGNOSIS — E663 Overweight: Secondary | ICD-10-CM

## 2018-09-10 DIAGNOSIS — M810 Age-related osteoporosis without current pathological fracture: Secondary | ICD-10-CM

## 2018-09-10 DIAGNOSIS — I10 Essential (primary) hypertension: Secondary | ICD-10-CM

## 2018-09-10 LAB — CBC
HEMATOCRIT: 41 % (ref 36.0–46.0)
Hemoglobin: 14.1 g/dL (ref 12.0–15.0)
MCHC: 34.3 g/dL (ref 30.0–36.0)
MCV: 91.8 fl (ref 78.0–100.0)
Platelets: 271 10*3/uL (ref 150.0–400.0)
RBC: 4.47 Mil/uL (ref 3.87–5.11)
RDW: 13.4 % (ref 11.5–15.5)
WBC: 6.9 10*3/uL (ref 4.0–10.5)

## 2018-09-10 LAB — COMPREHENSIVE METABOLIC PANEL
ALT: 15 U/L (ref 0–35)
AST: 22 U/L (ref 0–37)
Albumin: 4.4 g/dL (ref 3.5–5.2)
Alkaline Phosphatase: 56 U/L (ref 39–117)
BUN: 14 mg/dL (ref 6–23)
CO2: 29 meq/L (ref 19–32)
Calcium: 10 mg/dL (ref 8.4–10.5)
Chloride: 103 mEq/L (ref 96–112)
Creatinine, Ser: 0.95 mg/dL (ref 0.40–1.20)
GFR: 59.06 mL/min — ABNORMAL LOW (ref >60.00)
GLUCOSE: 104 mg/dL — AB (ref 70–99)
POTASSIUM: 4.2 meq/L (ref 3.5–5.1)
SODIUM: 140 meq/L (ref 135–145)
TOTAL PROTEIN: 8.1 g/dL (ref 6.0–8.3)
Total Bilirubin: 0.6 mg/dL (ref 0.2–1.2)

## 2018-09-10 LAB — LIPID PANEL
Cholesterol: 222 mg/dL — ABNORMAL HIGH (ref 0–200)
HDL: 48 mg/dL (ref >39.00)
NonHDL: 174.26
Total CHOL/HDL Ratio: 5
Triglycerides: 219 mg/dL — ABNORMAL HIGH (ref 0.0–149.0)
VLDL: 43.8 mg/dL — ABNORMAL HIGH (ref 0.0–40.0)

## 2018-09-10 LAB — VITAMIN D 25 HYDROXY (VIT D DEFICIENCY, FRACTURES): VITD: 35.78 ng/mL (ref 30.00–100.00)

## 2018-09-10 LAB — LDL CHOLESTEROL, DIRECT: LDL DIRECT: 153 mg/dL

## 2018-09-10 NOTE — Assessment & Plan Note (Signed)
Adequately controlled for age.  Check lab work.  Continue amlodipine.

## 2018-09-10 NOTE — Patient Instructions (Signed)
Nice to see you. We will get a bone density scan. We will get lab work today and contact you with the results. Please go to the pharmacy to get your flu shot.

## 2018-09-10 NOTE — Assessment & Plan Note (Signed)
-   DEXA scan ordered

## 2018-09-10 NOTE — Progress Notes (Signed)
Tommi Rumps, MD Phone: [phone]  Lori Edwards is a 84 y.o. female who presents today for f/u.  CC: htn, osteoporosis, HLD  HYPERTENSION  Disease Monitoring  Home BP Monitoring 124/85 Chest pain- no    Dyspnea- no Medications  Compliance-  Taking amlodipine.   Edema- no  Osteoporosis: She has been off of Fosamax over the last year and a half.  She did break her wrist recently.  She is on Fosamax for greater than 20 years without a holiday.  We will check a DEXA scan.  Hyperlipidemia: She has changed her diet and is eating less quantity.  She had gained weight previously though is lost it now.  She goes to exercise 5 days a week.  Takes fish oil and red yeast rice.  She does not want to continue breast cancer screening given her age.  She reports that she was up-to-date on colonoscopy and was advised that she did not need anymore after her last one at around age 7.  No polyps.  No family history of colon cancer, breast cancer, or ovarian cancer.    Social History   Tobacco Use  Smoking Status Never Smoker  Smokeless Tobacco Never Used     ROS see history of present illness  Objective  Physical Exam Vitals:   09/10/18 0923  BP: 138/72  Pulse: 64  Temp: 98.3 F (36.8 C)  SpO2: 98%    BP Readings from Last 3 Encounters:  09/10/18 138/72  07/23/18 140/78  07/12/18 (!) 148/70   Wt Readings from Last 3 Encounters:  09/10/18 121 lb 12.8 oz (55.2 kg)  07/23/18 123 lb 3.2 oz (55.9 kg)  07/12/18 125 lb 6.4 oz (56.9 kg)    Physical Exam  Constitutional: No distress.  HENT:  Head: Normocephalic and atraumatic.  Mouth/Throat: Oropharynx is clear and moist.  Eyes: Pupils are equal, round, and reactive to light. Conjunctivae are normal.  Cardiovascular: Normal rate, regular rhythm and normal heart sounds.  Pulmonary/Chest: Effort normal and breath sounds normal.  Abdominal: Soft. Bowel sounds are normal. She exhibits no distension. There is no tenderness.  There is no rebound and no guarding.  Musculoskeletal: She exhibits no edema.  Neurological: She is alert.  Skin: Skin is warm and dry. She is not diaphoretic.     Assessment/Plan: Please see individual problem list.  Essential hypertension Adequately controlled for age.  Check lab work.  Continue amlodipine.  Osteoporosis DEXA scan ordered.  Hyperlipidemia Continue diet and exercise.  Continue fish oil and red yeast rice.  Check lipid panel.  Overweight (BMI 25.0-29.9) Now in the normal range.  Will resolve this issue.  Continue diet and exercise.  Leukocytosis noted on prior lab work.  We will repeat today.   Health Maintenance: DEXA scan ordered.  It appears she is up-to-date on Pneumovax after review of [facility]. Tdap given today.   Orders Placed This Encounter  Procedures  . DG Bone Density    Standing Status:   Future    Standing Expiration Date:   11/11/2019    Order Specific Question:   Reason for Exam (SYMPTOM  OR DIAGNOSIS REQUIRED)    Answer:   osteoporosis    Order Specific Question:   Preferred imaging location?    Answer:   [facility]  . Tdap vaccine greater than or equal to 7yo IM  . Comp Met (CMET)  . CBC  . Lipid panel  . Vitamin D (25 hydroxy)  . LDL cholesterol, direct  No orders of the defined types were placed in this encounter.    Tommi Rumps, MD [place]

## 2018-09-10 NOTE — Assessment & Plan Note (Addendum)
Now in the normal range.  Will resolve this issue.  Continue diet and exercise.

## 2018-09-10 NOTE — Assessment & Plan Note (Signed)
Continue diet and exercise.  Continue fish oil and red yeast rice.  Check lipid panel.

## 2019-03-11 ENCOUNTER — Ambulatory Visit (INDEPENDENT_AMBULATORY_CARE_PROVIDER_SITE_OTHER): Payer: Medicare Other | Admitting: Family Medicine

## 2019-03-11 ENCOUNTER — Encounter: Payer: Self-pay | Admitting: Family Medicine

## 2019-03-11 ENCOUNTER — Other Ambulatory Visit: Payer: Self-pay

## 2019-03-11 VITALS — BP 140/76 | HR 73 | Temp 97.4°F | Ht 60.0 in | Wt 126.8 lb

## 2019-03-11 DIAGNOSIS — E785 Hyperlipidemia, unspecified: Secondary | ICD-10-CM | POA: Diagnosis not present

## 2019-03-11 DIAGNOSIS — I10 Essential (primary) hypertension: Secondary | ICD-10-CM

## 2019-03-11 DIAGNOSIS — M810 Age-related osteoporosis without current pathological fracture: Secondary | ICD-10-CM

## 2019-03-11 LAB — BASIC METABOLIC PANEL
BUN: 18 mg/dL (ref 6–23)
CHLORIDE: 104 meq/L (ref 96–112)
CO2: 26 meq/L (ref 19–32)
Calcium: 9.4 mg/dL (ref 8.4–10.5)
Creatinine, Ser: 0.85 mg/dL (ref 0.40–1.20)
GFR: 63.1 mL/min (ref >60.00)
GLUCOSE: 94 mg/dL (ref 70–99)
POTASSIUM: 3.8 meq/L (ref 3.5–5.1)
SODIUM: 139 meq/L (ref 135–145)

## 2019-03-11 LAB — VITAMIN D 25 HYDROXY (VIT D DEFICIENCY, FRACTURES): VITD: 38.27 ng/mL (ref 30.00–100.00)

## 2019-03-11 NOTE — Assessment & Plan Note (Addendum)
Check vitamin D.  Continue calcium and vitamin D supplementation.  Bone density scan ordered.

## 2019-03-11 NOTE — Progress Notes (Signed)
  Marikay Alar, MD Phone: [phone]  Lori Edwards is a 85 y.o. female who presents today for f/u.  HYPERTENSION  Disease Monitoring  Home BP Monitoring 130s/70s Chest pain- no    Dyspnea- no Medications  Compliance-  Taking amlodipine.  Edema- no  Hyperlipidemia: Patient notes she eats fairly healthfully.  She does eat quite a few carbs with rice as 1 of her staples.  She eats an egg with the rice.  Does eat vegetable soup.  Eats fruits.  She was exercising significantly though that has tapered off some with the coronavirus outbreak.  She is now walking some.  She takes omega-3 occasionally.  Does take red yeast rice.  Osteoporosis: She was supposed to undergo a DEXA scan following her last visit though did not do that as she did not hear anything related to this.  She does take a multivitamin with vitamin D.  She is on calcium supplement.  She notes no recurrence of broken bones.    Social History   Tobacco Use  Smoking Status Never Smoker  Smokeless Tobacco Never Used     ROS see history of present illness  Objective  Physical Exam Vitals:   03/11/19 0836  BP: 140/76  Pulse: 73  Temp: (!) 97.4 F (36.3 C)  SpO2: 97%    BP Readings from Last 3 Encounters:  03/11/19 140/76  09/10/18 138/72  07/23/18 140/78   Wt Readings from Last 3 Encounters:  03/11/19 126 lb 12.8 oz (57.5 kg)  09/10/18 121 lb 12.8 oz (55.2 kg)  07/23/18 123 lb 3.2 oz (55.9 kg)    Physical Exam Constitutional:      General: She is not in acute distress.    Appearance: She is not diaphoretic.  Cardiovascular:     Rate and Rhythm: Normal rate and regular rhythm.     Heart sounds: Normal heart sounds.  Pulmonary:     Effort: Pulmonary effort is normal.     Breath sounds: Normal breath sounds.  Musculoskeletal:     Right lower leg: No edema.     Left lower leg: No edema.  Skin:    General: Skin is warm and dry.  Neurological:     Mental Status: She is alert.       Assessment/Plan: Please see individual problem list.  Essential hypertension Well-controlled at home.  Continue current regimen.  Check BMP.  Osteoporosis Check vitamin D.  Continue calcium and vitamin D supplementation.  Bone density scan ordered.  Hyperlipidemia Patient has had no events to vascular disease.  Does not meet criteria for statin therapy.  She will continue on red yeast rice.   Orders Placed This Encounter  Procedures  . DG Bone Density    Standing Status:   Future    Standing Expiration Date:   05/10/2020    Order Specific Question:   Reason for Exam (SYMPTOM  OR DIAGNOSIS REQUIRED)    Answer:   osteoporosis follow-up, age related estrogen deficiency    Order Specific Question:   Preferred imaging location?    Answer:   [facility]  . Vitamin D (25 hydroxy)  . Basic Metabolic Panel (BMET)    No orders of the defined types were placed in this encounter.    Marikay Alar, MD [facility]

## 2019-03-11 NOTE — Assessment & Plan Note (Signed)
Well-controlled at home.  Continue current regimen.  Check BMP. 

## 2019-03-11 NOTE — Patient Instructions (Signed)
Nice to see you. We will check lab work today and contact you with the results. If you do not hear about your bone density scan being scheduled for the sometime in the next several months please let us know.

## 2019-03-11 NOTE — Assessment & Plan Note (Signed)
Patient has had no events to vascular disease.  Does not meet criteria for statin therapy.  She will continue on red yeast rice.

## 2019-03-15 ENCOUNTER — Encounter: Payer: Self-pay | Admitting: Family Medicine

## 2019-04-04 ENCOUNTER — Other Ambulatory Visit: Payer: Self-pay | Admitting: Family Medicine

## 2019-04-04 MED ORDER — AMLODIPINE BESYLATE 5 MG PO TABS: 5.0000 mg | ORAL_TABLET | Freq: Every day | ORAL | 1 refills | Status: DC

## 2019-04-04 NOTE — Telephone Encounter (Signed)
Requested Prescriptions  Pending Prescriptions Disp Refills  . amLODipine (NORVASC) 5 MG tablet 90 tablet 1    Sig: Take 1 tablet (5 mg total) by mouth daily.     Cardiovascular:  Calcium Channel Blockers Failed - 04/04/2019 11:52 AM      Failed - Last BP in normal range    BP Readings from Last 1 Encounters:  03/11/19 140/76         Passed - Valid encounter within last 6 months    Recent Outpatient Visits          3 weeks ago Essential hypertension   [facility] [place], Yehuda Mao, MD   6 months ago Age-related osteoporosis without current pathological fracture   [facility] [place], Yehuda Mao, MD   8 months ago Essential hypertension   [facility] [place], Yehuda Mao, MD   1 year ago Encounter for immunization   [facility] Glori Luis, MD   2 years ago Essential hypertension   [facility] [place], Yehuda Mao, MD      Future Appointments            In 5 months Birdie Sons, Yehuda Mao, MD [facility]   In 5 months O'Brien-Blaney, Vivianne Spence, LPN [facility] [place]   In 5 months Birdie Sons, Yehuda Mao, MD [facility], [facility]

## 2019-05-27 ENCOUNTER — Other Ambulatory Visit: Payer: Self-pay

## 2019-05-27 ENCOUNTER — Ambulatory Visit
Admission: RE | Admit: 2019-05-27 | Discharge: 2019-05-27 | Disposition: A | Discharge status: Home / Self Care | Payer: Medicare Other | Source: Ambulatory Visit | Attending: Family Medicine | Admitting: Family Medicine

## 2019-05-27 DIAGNOSIS — M85851 Other specified disorders of bone density and structure, right thigh: Secondary | ICD-10-CM | POA: Diagnosis not present

## 2019-05-27 DIAGNOSIS — Z780 Asymptomatic menopausal state: Secondary | ICD-10-CM | POA: Diagnosis not present

## 2019-05-27 DIAGNOSIS — M810 Age-related osteoporosis without current pathological fracture: Secondary | ICD-10-CM | POA: Diagnosis not present

## 2019-06-20 ENCOUNTER — Other Ambulatory Visit: Payer: Self-pay | Admitting: Family Medicine

## 2019-06-20 MED ORDER — ALENDRONATE SODIUM 70 MG PO TABS: 70.0000 mg | ORAL_TABLET | ORAL | 11 refills | Status: DC

## 2019-08-30 DIAGNOSIS — Z23 Encounter for immunization: Secondary | ICD-10-CM | POA: Diagnosis not present

## 2019-09-11 ENCOUNTER — Ambulatory Visit: Payer: Medicare Other | Admitting: Family Medicine

## 2019-09-12 ENCOUNTER — Other Ambulatory Visit: Payer: Self-pay

## 2019-09-12 ENCOUNTER — Other Ambulatory Visit: Payer: Self-pay | Admitting: Family Medicine

## 2019-09-13 ENCOUNTER — Encounter: Payer: Self-pay | Admitting: Family Medicine

## 2019-09-13 ENCOUNTER — Other Ambulatory Visit: Payer: Self-pay

## 2019-09-13 ENCOUNTER — Ambulatory Visit (INDEPENDENT_AMBULATORY_CARE_PROVIDER_SITE_OTHER): Payer: Medicare Other | Admitting: Family Medicine

## 2019-09-13 ENCOUNTER — Ambulatory Visit (INDEPENDENT_AMBULATORY_CARE_PROVIDER_SITE_OTHER): Payer: Medicare Other

## 2019-09-13 VITALS — BP 130/70 | HR 68 | Temp 97.4°F | Ht 59.0 in | Wt 129.8 lb

## 2019-09-13 DIAGNOSIS — E785 Hyperlipidemia, unspecified: Secondary | ICD-10-CM | POA: Diagnosis not present

## 2019-09-13 DIAGNOSIS — Z0000 Encounter for general adult medical examination without abnormal findings: Secondary | ICD-10-CM

## 2019-09-13 DIAGNOSIS — M810 Age-related osteoporosis without current pathological fracture: Secondary | ICD-10-CM | POA: Diagnosis not present

## 2019-09-13 DIAGNOSIS — I10 Essential (primary) hypertension: Secondary | ICD-10-CM | POA: Diagnosis not present

## 2019-09-13 LAB — LIPID PANEL
Cholesterol: 241 mg/dL — ABNORMAL HIGH (ref 0–200)
HDL: 51.5 mg/dL (ref >39.00)
LDL Cholesterol: 167 mg/dL — ABNORMAL HIGH (ref 0–99)
NonHDL: 189.33
Total CHOL/HDL Ratio: 5
Triglycerides: 112 mg/dL (ref 0.0–149.0)
VLDL: 22.4 mg/dL (ref 0.0–40.0)

## 2019-09-13 LAB — COMPREHENSIVE METABOLIC PANEL
ALT: 15 U/L (ref 0–35)
AST: 20 U/L (ref 0–37)
Albumin: 4.5 g/dL (ref 3.5–5.2)
Alkaline Phosphatase: 64 U/L (ref 39–117)
BUN: 13 mg/dL (ref 6–23)
CO2: 26 mEq/L (ref 19–32)
Calcium: 10 mg/dL (ref 8.4–10.5)
Chloride: 104 mEq/L (ref 96–112)
Creatinine, Ser: 0.86 mg/dL (ref 0.40–1.20)
GFR: 62.18 mL/min (ref >60.00)
Glucose, Bld: 104 mg/dL — ABNORMAL HIGH (ref 70–99)
Potassium: 4.2 mEq/L (ref 3.5–5.1)
Sodium: 140 mEq/L (ref 135–145)
Total Bilirubin: 0.5 mg/dL (ref 0.2–1.2)
Total Protein: 7.7 g/dL (ref 6.0–8.3)

## 2019-09-13 LAB — VITAMIN D 25 HYDROXY (VIT D DEFICIENCY, FRACTURES): VITD: 45.89 ng/mL (ref 30.00–100.00)

## 2019-09-13 MED ORDER — AMLODIPINE BESYLATE 5 MG PO TABS: 5.0000 mg | ORAL_TABLET | Freq: Every day | ORAL | 3 refills | Status: DC

## 2019-09-13 NOTE — Assessment & Plan Note (Signed)
Continue vitamin D and Fosamax.  Check vitamin D level.

## 2019-09-13 NOTE — Progress Notes (Signed)
Tommi Rumps, MD Phone: [phone]  Lori Edwards is a 85 y.o. female who presents today for follow-up.  Hypertension: Typically around 174 systolically.  Taking amlodipine.  No chest pain, shortness of breath, or edema.  Hyperlipidemia: Taking red yeast rice.  No abdominal pain or myalgias.  She has been exercising with walking and doing yoga.  Eating fairly healthfully with oatmeal for breakfast and lots of vegetables.  Mostly tofu for protein sources does eat some meat.  Osteoporosis: Taking Fosamax and vitamin D.  No broken bones.  No GI issues with the Fosamax.  Social History   Tobacco Use  Smoking Status Never Smoker  Smokeless Tobacco Never Used     ROS  General:  Negative for nexplained weight loss, fever Skin: Negative for new or changing mole, sore that won't heal HEENT: Negative for trouble hearing, trouble seeing, ringing in ears, mouth sores, hoarseness, change in voice, dysphagia. CV:  Negative for chest pain, dyspnea, edema, palpitations Resp: Negative for cough, dyspnea, hemoptysis GI: Negative for nausea, vomiting, diarrhea, constipation, abdominal pain, melena, hematochezia. GU: Negative for dysuria, incontinence, urinary hesitance, hematuria, vaginal or penile discharge, polyuria, sexual difficulty, lumps in testicle or breasts MSK: Negative for muscle cramps or aches, joint pain or swelling Neuro: Negative for headaches, weakness, numbness, dizziness, passing out/fainting Psych: Negative for depression, anxiety, memory problems    Objective  Physical Exam Vitals:   09/13/19 1048  BP: 130/70  Pulse: 68  Temp: (!) 97.4 F (36.3 C)  SpO2: 97%    BP Readings from Last 3 Encounters:  09/13/19 130/70  03/11/19 140/76  09/10/18 138/72   Wt Readings from Last 3 Encounters:  09/13/19 129 lb 12.8 oz (58.9 kg)  03/11/19 126 lb 12.8 oz (57.5 kg)  09/10/18 121 lb 12.8 oz (55.2 kg)    Physical Exam Constitutional:      General: She is not in  acute distress.    Appearance: She is not diaphoretic.  Eyes:     Conjunctiva/sclera: Conjunctivae normal.     Pupils: Pupils are equal, round, and reactive to light.  Cardiovascular:     Rate and Rhythm: Normal rate and regular rhythm.     Heart sounds: Normal heart sounds.  Pulmonary:     Effort: Pulmonary effort is normal.     Breath sounds: Normal breath sounds.  Abdominal:     General: Bowel sounds are normal. There is no distension.     Palpations: Abdomen is soft.     Tenderness: There is no abdominal tenderness. There is no guarding or rebound.  Musculoskeletal:     Right lower leg: No edema.     Left lower leg: No edema.  Lymphadenopathy:     Cervical: No cervical adenopathy.  Skin:    General: Skin is warm and dry.  Neurological:     Mental Status: She is alert.  Psychiatric:        Mood and Affect: Mood normal.      Assessment/Plan: Please see individual problem list.  Essential hypertension Well-controlled.  Continue current regimen.  Refill given.  Check lab work.  Hyperlipidemia Discussed that given her age we do not necessarily have to continue testing though she would prefer we can continue with this.  Advised that her cholesterol would have to be quite elevated or she would have to have an event for me to start her on medication.  She will continue red yeast rice.  Check lipid panel.  Osteoporosis Continue vitamin D and Fosamax.  Check vitamin D level.   Health Maintenance: Patient received her flu vaccine at the pharmacy.  Orders Placed This Encounter  Procedures  . Comp Met (CMET)  . Lipid panel  . Vitamin D (25 hydroxy)    No orders of the defined types were placed in this encounter.    Tommi Rumps, MD [place]

## 2019-09-13 NOTE — Progress Notes (Signed)
Subjective:   Lori Edwards is a 85 y.o. female who presents for Medicare Annual (Subsequent) preventive examination.  Review of Systems:  No ROS.  Medicare Wellness Virtual Visit.  Visual/audio telehealth visit, UTA vital signs.   See social history for additional risk factors. Vital signs to taken in later visit today during cpe.   Cardiac Risk Factors include: advanced age (>29men, >31 women);hypertension     Objective:     Vitals: There were no vitals taken for this visit.  There is no height or weight on file to calculate BMI.  Advanced Directives 09/13/2019 07/12/2018 07/31/2017 07/31/2017  Does Patient Have a Medical Advance Directive? Yes Yes No No  Type of Paramedic of [place];Living will [place];Living will - -  Does patient want to make changes to medical advance directive? No - Patient declined No - Patient declined - -  Copy of [place] in Chart? Yes - validated most recent copy scanned in chart (See row information) No - copy requested - -  Would patient like information on creating a medical advance directive? - - No - Patient declined No - Patient declined    Tobacco Social History   Tobacco Use  Smoking Status Never Smoker  Smokeless Tobacco Never Used     Counseling given: Not Answered   Clinical Intake:  Pre-visit preparation completed: Yes        Diabetes: No  How often do you need to have someone help you when you read instructions, pamphlets, or other written materials from your doctor or pharmacy?: 1 - Never  Interpreter Needed?: No     Past Medical History:  Diagnosis Date  . Hyperlipidemia   . Hypertension   . Thyroid disease    Past Surgical History:  Procedure Laterality Date  . OPEN REDUCTION INTERNAL FIXATION (ORIF) DISTAL RADIAL FRACTURE Right 07/31/2017   Procedure: OPEN REDUCTION INTERNAL FIXATION (ORIF) DISTAL RADIAL FRACTURE;  Surgeon: Earnestine Leys, MD;   Location: [facility] ORS;  Service: Orthopedics;  Laterality: Right;   Family History  Problem Relation Age of Onset  . Stroke Other        Parent  . Stroke Mother   . Hypertension Mother   . Hypertension Father    Social History   Socioeconomic History  . Marital status: Widowed    Spouse name: Not on file  . Number of children: Not on file  . Years of education: Not on file  . Highest education level: Not on file  Occupational History  . Not on file  Social Needs  . Financial resource strain: Not hard at all  . Food insecurity    Worry: Never true    Inability: Never true  . Transportation needs    Medical: No    Non-medical: No  Tobacco Use  . Smoking status: Never Smoker  . Smokeless tobacco: Never Used  Substance and Sexual Activity  . Alcohol use: No  . Drug use: No  . Sexual activity: Not on file  Lifestyle  . Physical activity    Days per week: 5 days    Minutes per session: 30 min  . Stress: Not at all  Relationships  . Social Herbalist on phone: Not on file    Gets together: Not on file    Attends religious service: Not on file    Active member of club or organization: Not on file    Attends meetings of  clubs or organizations: Not on file    Relationship status: Not on file  Other Topics Concern  . Not on file  Social History Narrative  . Not on file    Outpatient Encounter Medications as of 09/13/2019  Medication Sig  . alendronate (FOSAMAX) 70 MG tablet Take 1 tablet (70 mg total) by mouth every 7 (seven) days. Take with a full glass of water on an empty stomach.  Marland Kitchen. amLODipine (NORVASC) 5 MG tablet Take 1 tablet (5 mg total) by mouth daily.  Marland Kitchen. aspirin EC 81 MG tablet Take 81 mg by mouth daily.  . cholecalciferol (VITAMIN D3) 25 MCG (1000 UT) tablet Take 1,000 Units by mouth daily.  . Multiple Vitamins-Minerals (ONE-A-DAY ENERGY PO) Take 1 tablet by mouth daily.  . Omega-3 Fatty Acids (FISH OIL) 1000 MG CAPS Take 1 capsule by mouth daily.   Bertram Gala. Polyethyl Glycol-Propyl Glycol (SYSTANE OP) Apply 1 drop to eye daily as needed. Dry Eyes  . Red Yeast Rice Extract (RED YEAST RICE PO) Take 2 tablets by mouth daily.   No facility-administered encounter medications on file as of 09/13/2019.     Activities of Daily Living In your present state of health, do you have any difficulty performing the following activities: 09/13/2019  Hearing? N  Vision? N  Difficulty concentrating or making decisions? N  Walking or climbing stairs? N  Dressing or bathing? N  Doing errands, shopping? N  Preparing Food and eating ? N  Using the Toilet? N  In the past six months, have you accidently leaked urine? N  Do you have problems with loss of bowel control? N  Managing your Medications? N  Managing your Finances? N  Housekeeping or managing your Housekeeping? N  Some recent data might be hidden    Patient Care Team: Glori LuisSonnenberg, Eric G, MD as PCP - General (Family Medicine)    Assessment:   This is a routine wellness examination for [name].  I connected with patient 09/13/19 at 10:00 AM EDT by an audio enabled telemedicine application and verified that I am speaking with the correct person using two identifiers. Patient stated full name and DOB. Patient gave permission to continue with virtual visit. Patient's location was at home and Nurse's location was at [facility] office.   Health Maintenance Due: See completed HM at the end of note.   Eye: Visual acuity not assessed. Virtual visit. Wears reading glasses only. Followed by their ophthalmologist every 12 months.   Dental: Visits every 12 months.    Hearing: Demonstrates normal hearing during visit.  Safety:  Patient feels safe at home- yes Patient does have smoke detectors at home- yes Patient does wear sunscreen or protective clothing when in direct sunlight - yes Patient does wear seat belt when in a moving vehicle - yes Patient drives- yes Adequate lighting in walkways free from  debris- yes Grab bars and handrails used as appropriate- yes Ambulates with no assistive device Cell phone on person when ambulating outside of the home- yes  Social: Alcohol intake - no       Smoking history- never   Smokers in home? none Illicit drug use? none  Depression: PHQ 2 &9 complete. See screening below. Denies irritability, anhedonia, sadness/tearfullness.  Stable.   Falls: See screening below.    Medication: Taking as directed and without issues.   Covid-19: Precautions and sickness symptoms discussed. Wears mask, social distancing, hand hygiene as appropriate.   Activities of Daily Living Patient denies needing assistance with:  household chores, feeding themselves, getting from bed to chair, getting to the toilet, bathing/showering, dressing, managing money, or preparing meals.   Memory: Patient is alert. Patient denies difficulty focusing or concentrating. Correctly identified the president of the [place], season and recall. Patient likes to read, play computer games for brain stimulation.  BMI- discussed the importance of a healthy diet, water intake and the benefits of aerobic exercise.  Educational material provided.  Physical activity- walking 1 mile, no routine.  Diet:  Healthy Water: good intake Caffeine: 2 cups of coffee  Other Providers Patient Care Team: Glori Luis, MD as PCP - General (Family Medicine)  Exercise Activities and Dietary recommendations Current Exercise Habits: Home exercise routine, Type of exercise: walking;treadmill(dancing, bicycle), Time (Minutes): 30, Frequency (Times/Week): 5, Weekly Exercise (Minutes/Week): 150, Intensity: Mild  Goals    . DIET - INCREASE WATER INTAKE       Fall Risk Fall Risk  09/13/2019 07/12/2018 02/22/2017  Falls in the past year? 0 No No   Timed Get Up and Go performed: no, virtual visit  Depression Screen PHQ 2/9 Scores 09/13/2019 07/12/2018 02/22/2017  PHQ - 2 Score 0 0 0     Cognitive  Function MMSE - Mini Mental State Exam 07/12/2018  Orientation to time 5  Orientation to Place 5  Registration 3  Attention/ Calculation 5  Recall 3  Language- name 2 objects 2  Language- repeat 1  Language- follow 3 step command 3  Language- read & follow direction 1  Write a sentence 1  Copy design 1  Total score 30     6CIT Screen 09/13/2019  What Year? 0 points  What month? 0 points  What time? 0 points  Count back from 20 0 points  Months in reverse 0 points  Repeat phrase 0 points  Total Score 0    Immunization History  Administered Date(s) Administered  . Fluad Quad(high Dose 65+) 08/29/2019  . Influenza, High Dose Seasonal PF 08/25/2016, 09/07/2017, 09/10/2018  . Pneumococcal Conjugate-13 08/03/2017  . Tdap 09/10/2018   Screening Tests Health Maintenance  Topic Date Due  . INFLUENZA VACCINE  07/20/2019  . TETANUS/TDAP  09/10/2028  . DEXA SCAN  Completed  . PNA vac Low Risk Adult  Completed      Plan:   Keep all routine maintenance appointments.   Cpe  @ 1030 today with your doctor.   Medicare Attestation I have personally reviewed: The patient's medical and social history Their use of alcohol, tobacco or illicit drugs Their current medications and supplements The patient's functional ability including ADLs,fall risks, home safety risks, cognitive, and hearing and visual impairment Diet and physical activities Evidence for depression   In addition, I have reviewed and discussed with patient certain preventive protocols, quality metrics, and best practice recommendations. A written personalized care plan for preventive services as well as general preventive health recommendations were provided to patient via mail.     Ashok Pall, LPN  7/41/2878

## 2019-09-13 NOTE — Assessment & Plan Note (Signed)
Well-controlled.  Continue current regimen.  Refill given.  Check lab work.

## 2019-09-13 NOTE — Assessment & Plan Note (Signed)
Discussed that given her age we do not necessarily have to continue testing though she would prefer we can continue with this.  Advised that her cholesterol would have to be quite elevated or she would have to have an event for me to start her on medication.  She will continue red yeast rice.  Check lipid panel.

## 2019-09-13 NOTE — Patient Instructions (Signed)
Nice to see you. Please continue to eat healthfully and remain active. We will check lab work today.

## 2019-09-13 NOTE — Patient Instructions (Addendum)
  Lori Edwards , Thank you for taking time to come for your Medicare Wellness Visit. I appreciate your ongoing commitment to your health goals. Please review the following plan we discussed and let me know if I can assist you in the future.   These are the goals we discussed: Goals    . DIET - INCREASE WATER INTAKE       This is a list of the screening recommended for you and due dates:  Health Maintenance  Topic Date Due  . Flu Shot  07/20/2019  . Tetanus Vaccine  09/10/2028  . DEXA scan (bone density measurement)  Completed  . Pneumonia vaccines  Completed

## 2019-09-14 ENCOUNTER — Encounter: Payer: Self-pay | Admitting: Family Medicine

## 2019-09-14 NOTE — Progress Notes (Signed)
I have reviewed the above note and agree.  Maleny Candy, M.D.  

## 2019-09-25 DIAGNOSIS — H2513 Age-related nuclear cataract, bilateral: Secondary | ICD-10-CM | POA: Diagnosis not present

## 2019-12-31 DIAGNOSIS — Z23 Encounter for immunization: Secondary | ICD-10-CM | POA: Diagnosis not present

## 2020-01-24 ENCOUNTER — Encounter: Payer: Self-pay | Admitting: Family Medicine

## 2020-01-24 ENCOUNTER — Ambulatory Visit (INDEPENDENT_AMBULATORY_CARE_PROVIDER_SITE_OTHER): Payer: Medicare Other | Admitting: Family Medicine

## 2020-01-24 ENCOUNTER — Other Ambulatory Visit: Payer: Self-pay

## 2020-01-24 VITALS — BP 130/80 | HR 83 | Temp 97.5°F | Ht 59.0 in | Wt 134.0 lb

## 2020-01-24 DIAGNOSIS — I10 Essential (primary) hypertension: Secondary | ICD-10-CM | POA: Diagnosis not present

## 2020-01-24 DIAGNOSIS — R9389 Abnormal findings on diagnostic imaging of other specified body structures: Secondary | ICD-10-CM | POA: Insufficient documentation

## 2020-01-24 NOTE — Assessment & Plan Note (Signed)
Well-controlled.  Continue current regimen. 

## 2020-01-24 NOTE — Progress Notes (Signed)
  Marikay Alar, MD Phone: [phone]  Lori Edwards is a 86 y.o. female who presents today for f/u.  Abnormal x-ray: Patient notes she saw her dentist.  She had an x-ray that she states revealed left-sided carotid artery disease.  Patient has never had a stroke or heart attack.  We have not received the x-ray report yet.  Patient is on blood pressure medication and supplements for her cholesterol.  Hypertension: BP typically around 133 systolically.  She is taking amlodipine.  No chest pain or shortness of breath.  Social History   Tobacco Use  Smoking Status Never Smoker  Smokeless Tobacco Never Used     ROS see history of present illness  Objective  Physical Exam Vitals:   01/24/20 1323  BP: 130/80  Pulse: 83  Temp: (!) 97.5 F (36.4 C)  SpO2: 95%    BP Readings from Last 3 Encounters:  01/24/20 130/80  09/13/19 130/70  03/11/19 140/76   Wt Readings from Last 3 Encounters:  01/24/20 134 lb (60.8 kg)  09/13/19 129 lb 12.8 oz (58.9 kg)  03/11/19 126 lb 12.8 oz (57.5 kg)    Physical Exam Constitutional:      General: She is not in acute distress.    Appearance: She is not diaphoretic.  Cardiovascular:     Rate and Rhythm: Normal rate and regular rhythm.     Heart sounds: Normal heart sounds.     Comments: No carotid bruits Pulmonary:     Effort: Pulmonary effort is normal.     Breath sounds: Normal breath sounds.  Skin:    General: Skin is warm and dry.  Neurological:     Mental Status: She is alert.      Assessment/Plan: Please see individual problem list.  Abnormal x-ray Reported carotid artery disease on x-ray on the left.  Will obtain carotid duplex.  Essential hypertension Well-controlled.  Continue current regimen.   Orders Placed This Encounter  Procedures  . US Carotid Duplex Bilateral    Standing Status:   Future    Standing Expiration Date:   03/23/2021    Order Specific Question:   Reason for exam:    Answer:   carotid  calcification on x-ray    Order Specific Question:   Preferred imaging location?    Answer:   [facility]    No orders of the defined types were placed in this encounter.   This visit occurred during the SARS-CoV-2 public health emergency.  Safety protocols were in place, including screening questions prior to the visit, additional usage of staff PPE, and extensive cleaning of exam room while observing appropriate contact time as indicated for disinfecting solutions.    Marikay Alar, MD [facility]

## 2020-01-24 NOTE — Patient Instructions (Signed)
Nice to see you. We will get you scheduled for an ultrasound of your carotid arteries.  If you do not hear anything about this in the next 2 weeks please let us know. If you develop any stroke symptoms such as numbness, weakness, speech changes, or vision changes please be evaluated immediately.

## 2020-01-24 NOTE — Assessment & Plan Note (Signed)
Reported carotid artery disease on x-ray on the left.  Will obtain carotid duplex.

## 2020-01-28 DIAGNOSIS — Z23 Encounter for immunization: Secondary | ICD-10-CM | POA: Diagnosis not present

## 2020-02-05 ENCOUNTER — Ambulatory Visit
Admission: RE | Admit: 2020-02-05 | Discharge: 2020-02-05 | Disposition: A | Discharge status: Home / Self Care | Payer: Medicare Other | Source: Ambulatory Visit | Attending: Family Medicine | Admitting: Family Medicine

## 2020-02-05 ENCOUNTER — Other Ambulatory Visit: Payer: Self-pay

## 2020-02-05 DIAGNOSIS — R9389 Abnormal findings on diagnostic imaging of other specified body structures: Secondary | ICD-10-CM | POA: Diagnosis not present

## 2020-02-05 DIAGNOSIS — I6523 Occlusion and stenosis of bilateral carotid arteries: Secondary | ICD-10-CM | POA: Diagnosis not present

## 2020-02-20 ENCOUNTER — Telehealth: Payer: Self-pay

## 2020-02-20 NOTE — Telephone Encounter (Signed)
Faxed a letter to dental office about addressing the xray of the carotid artery.  Lori Edwards,cma

## 2020-02-20 NOTE — Telephone Encounter (Signed)
Printed.  Please fax.

## 2020-03-16 ENCOUNTER — Ambulatory Visit: Payer: Medicare Other | Admitting: Family Medicine

## 2020-04-28 NOTE — Progress Notes (Signed)
Diet   Do you drink/eat things with caffeine: Yes  Marital Status: Widow   What year were you married? 1961  Do you live in a house, apartment, assisted living, condo, trailer, etc.? Apartment  Is it one or more stories? 3  How many persons live in your home? 1     Do you have any pets in your home?(please list) No  Highest level of education completed: 4 years of college, 2 years [employer]  Current or past profession: Horticulturist, commercial, Sports administrator  Do you exercise?: Yes Type and how often: Line dance five times a week  Living Will? Yes  Do you have a DNR (Do Not Resuscitate) [name] Form) form? No   If not, do you wish to discuss one? Yes  POA/HPOA forms? Yes  Difficulty bathing or dressing yourself? No  Difficulty preparing food or eating? No  Difficulty managing medications? No  Difficulty managing your finances? No  Difficulty affording your medications? No

## 2020-04-28 NOTE — Progress Notes (Signed)
Diet   Do you drink/eat things with caffeine: Yes  Marital Status: Widow   What year were you married? 1961  Do you live in a house, apartment, assisted living, condo, trailer, etc.? Apartment  Is it one or more stories? 3  How many persons live in your home? 1     Do you have any pets in your home?(please list) No  Highest level of education completed: 4 years of college, 2 years [employer]  Current or past profession: Graphic artist, floral designer  Do you exercise?: Yes Type and how often: Line dance five times a week  Living Will? Yes  Do you have a DNR (Do Not Resuscitate) (Gold Form) form? No   If not, do you wish to discuss one? Yes  POA/HPOA forms? Yes  Difficulty bathing or dressing yourself? No  Difficulty preparing food or eating? No  Difficulty managing medications? No  Difficulty managing your finances? No  Difficulty affording your medications? No       

## 2020-05-07 ENCOUNTER — Other Ambulatory Visit: Payer: Self-pay

## 2020-05-07 ENCOUNTER — Ambulatory Visit: Payer: Medicare Other | Admitting: Nurse Practitioner

## 2020-05-07 ENCOUNTER — Encounter: Payer: Self-pay | Admitting: Nurse Practitioner

## 2020-05-07 VITALS — BP 140/80 | HR 68 | Temp 97.8°F | Ht 59.0 in | Wt 128.0 lb

## 2020-05-07 DIAGNOSIS — E782 Mixed hyperlipidemia: Secondary | ICD-10-CM

## 2020-05-07 DIAGNOSIS — I10 Essential (primary) hypertension: Secondary | ICD-10-CM

## 2020-05-07 DIAGNOSIS — M810 Age-related osteoporosis without current pathological fracture: Secondary | ICD-10-CM

## 2020-05-07 NOTE — Patient Instructions (Addendum)
To add calcium 600 mg twice daily for osteoporosis   To come to [facility] for blood work between 7 and 7:15 on Thursday September 27th

## 2020-05-07 NOTE — Progress Notes (Signed)
Careteam: Patient Care Team: Sharon Seller, NP as PCP - General (Geriatric Medicine)  Advanced Directive information Does Patient Have a Medical Advance Directive?: Yes, Type of Advance Directive: Healthcare Power of [place];Living will, Does patient want to make changes to medical advance directive?: No - Patient declined  No Known Allergies  Chief Complaint  Patient presents with  . Establish Care    New patient to establish care     HPI: Patient is a 86 y.o. female seen in today at the [facility] to establish care at the [facility]  htn- blood pressure high today, reports better at home. Reports sbp of 150 is high for her. Usually sbp less than 140 Home Blood pressure cuff correlates with reading we got today in clinic  Home readings on memory are-  123/70 124/74  110/68   126/71  133/73  Osteoporosis- continues on fosamax 70 mg- reports she was previous on fosamax for 15 years and then it was stopped 1 year and now has been on medication for about 1 year. Also on vit d   Hyperlipidemia- was previously on red yeast rice, she does not wish to take any additional medication. Stopped because she ran out and never picked back up, agreeable to continue to take it because she did not have any adverse reactions to it. She had previously expressed she wish to stop following cholesterol due to advanced age.  Maintaining healthy diet.   Reports she feels well. No complaints. Last physical was in June.  Last AWV was 09/13/19  She has lived at [facility] since 2017. She is windowed. Has a son who lives close with 1 grandson. Since COVID visits have been limited.    Review of Systems:  Review of Systems  Constitutional: Negative for chills, fever and weight loss.  HENT: Negative for tinnitus.   Respiratory: Negative for cough, sputum production and shortness of breath.   Cardiovascular: Negative for chest pain, palpitations and leg swelling.  Gastrointestinal:  Negative for abdominal pain, constipation, diarrhea and heartburn.  Genitourinary: Negative for dysuria, frequency and urgency.  Musculoskeletal: Negative for back pain, falls, joint pain and myalgias.  Skin: Negative.   Neurological: Negative for dizziness and headaches.  Psychiatric/Behavioral: Negative for depression and memory loss. The patient does not have insomnia.    Past Medical History:  Diagnosis Date  . Hyperlipidemia   . Hypertension   . Thyroid disease    Past Surgical History:  Procedure Laterality Date  . OPEN REDUCTION INTERNAL FIXATION (ORIF) DISTAL RADIAL FRACTURE Right 07/31/2017   Procedure: OPEN REDUCTION INTERNAL FIXATION (ORIF) DISTAL RADIAL FRACTURE;  Surgeon: Deeann Saint, MD;  Location: [facility] ORS;  Service: Orthopedics;  Laterality: Right;   Social History:   reports that she has never smoked. She has never used smokeless tobacco. She reports that she does not drink alcohol or use drugs.  Family History  Problem Relation Age of Onset  . Stroke Other        Parent  . Stroke Mother   . Hypertension Mother   . Hypertension Father     Medications: Patient's Medications  New Prescriptions   No medications on file  Previous Medications   ALENDRONATE (FOSAMAX) 70 MG TABLET    Take 1 tablet (70 mg total) by mouth every 7 (seven) days. Take with a full glass of water on an empty stomach.   AMLODIPINE (NORVASC) 5 MG TABLET    Take 1 tablet (5 mg total) by  mouth daily.   CHOLECALCIFEROL (VITAMIN D3) 25 MCG (1000 UT) TABLET    Take 1,000 Units by mouth daily.   SHINGRIX INJECTION      Modified Medications   No medications on file  Discontinued Medications   ASPIRIN EC 81 MG TABLET    Take 81 mg by mouth daily.   MULTIPLE VITAMINS-MINERALS (ONE-A-DAY ENERGY PO)    Take 1 tablet by mouth daily.   OMEGA-3 FATTY ACIDS (FISH OIL) 1000 MG CAPS    Take 1 capsule by mouth daily.   POLYETHYL GLYCOL-PROPYL GLYCOL (SYSTANE OP)    Apply 1 drop to eye daily as needed.  Dry Eyes   RED YEAST RICE EXTRACT (RED YEAST RICE PO)    Take 2 tablets by mouth daily.    Physical Exam:  Vitals:   05/07/20 1325  BP: (!) 150/80  Pulse: 68  Temp: 97.8 F (36.6 C)  TempSrc: Temporal  SpO2: 99%  Weight: 128 lb (58.1 kg)  Height: 4\' 11"  (1.499 m)   Body mass index is 25.85 kg/m. Wt Readings from Last 3 Encounters:  05/07/20 128 lb (58.1 kg)  01/24/20 134 lb (60.8 kg)  09/13/19 129 lb 12.8 oz (58.9 kg)    Physical Exam Constitutional:      General: She is not in acute distress.    Appearance: She is well-developed. She is not diaphoretic.  HENT:     Head: Normocephalic and atraumatic.  Eyes:     Conjunctiva/sclera: Conjunctivae normal.     Pupils: Pupils are equal, round, and reactive to light.  Cardiovascular:     Rate and Rhythm: Normal rate and regular rhythm.     Heart sounds: Normal heart sounds.  Pulmonary:     Effort: Pulmonary effort is normal.     Breath sounds: Normal breath sounds.  Abdominal:     General: Bowel sounds are normal.     Palpations: Abdomen is soft.  Musculoskeletal:        General: No tenderness.     Cervical back: Normal range of motion and neck supple.  Skin:    General: Skin is warm and dry.  Neurological:     Mental Status: She is alert and oriented to person, place, and time.  Psychiatric:        Mood and Affect: Mood normal.        Behavior: Behavior normal.     Labs reviewed: Basic Metabolic Panel: Recent Labs    09/13/19 1058  NA 140  K 4.2  CL 104  CO2 26  GLUCOSE 104*  BUN 13  CREATININE 0.86  CALCIUM 10.0   Liver Function Tests: Recent Labs    09/13/19 1058  AST 20  ALT 15  ALKPHOS 64  BILITOT 0.5  PROT 7.7  ALBUMIN 4.5   No results for input(s): LIPASE, AMYLASE in the last 8760 hours. No results for input(s): AMMONIA in the last 8760 hours. CBC: No results for input(s): WBC, NEUTROABS, HGB, HCT, MCV, PLT in the last 8760 hours. Lipid Panel: Recent Labs    09/13/19 1058  CHOL  241*  HDL 51.50  LDLCALC 167*  TRIG 112.0  CHOLHDL 5   TSH: No results for input(s): TSH in the last 8760 hours. A1C: No results found for: HGBA1C   Assessment/Plan 1. Mixed hyperlipidemia She does not wish to start medication for elevated LDL. She has been on red yeast rice in the past and just ran out and stopped taking. She reports she may get  more to take because it did not cause any adverse effects. She also maintains heart healthy diet.   2. Essential hypertension Improved on recheck to 140/80, home readings at goal. Will continue norvasc 5 mg daily with low sodium diet.  3. Age-related osteoporosis without current pathological fracture -continues on fosamax with vit D, encouraged to calcium 600 mg BID -continues with weight bearing activity.   Next appt: 4 months for AWV and routine follow up with CBC and CMP prior to appt.  Carlos American. [place], [facility] [phone]

## 2020-05-17 ENCOUNTER — Other Ambulatory Visit: Payer: Self-pay | Admitting: Family Medicine

## 2020-07-24 ENCOUNTER — Ambulatory Visit: Payer: Medicare Other | Admitting: Family Medicine

## 2020-08-17 ENCOUNTER — Telehealth: Payer: Self-pay

## 2020-08-17 NOTE — Telephone Encounter (Signed)
Medical records request for outside records for dates 08/2016-04/2020  received from patient signed and dated 08/13/2020. Irving Burton Moser(front desk representative) notated on 08/14/2020 that All Records in [place]. Medical records request form was placed in shred container 08/17/2020.

## 2020-09-05 ENCOUNTER — Other Ambulatory Visit: Payer: Self-pay | Admitting: Family Medicine

## 2020-09-12 DIAGNOSIS — Z23 Encounter for immunization: Secondary | ICD-10-CM | POA: Diagnosis not present

## 2020-09-14 ENCOUNTER — Ambulatory Visit: Payer: Medicare Other

## 2020-09-14 ENCOUNTER — Encounter: Payer: Self-pay | Admitting: Nurse Practitioner

## 2020-09-14 DIAGNOSIS — M810 Age-related osteoporosis without current pathological fracture: Secondary | ICD-10-CM | POA: Diagnosis not present

## 2020-09-14 DIAGNOSIS — E782 Mixed hyperlipidemia: Secondary | ICD-10-CM | POA: Diagnosis not present

## 2020-09-14 DIAGNOSIS — I10 Essential (primary) hypertension: Secondary | ICD-10-CM | POA: Diagnosis not present

## 2020-09-14 LAB — HEPATIC FUNCTION PANEL
ALT: 18 (ref 7–35)
AST: 21 (ref 13–35)
Alkaline Phosphatase: 60 (ref 25–125)
Bilirubin, Total: 0.4

## 2020-09-14 LAB — COMPREHENSIVE METABOLIC PANEL
Albumin: 4.2 (ref 3.5–5.0)
Calcium: 9.8 (ref 8.7–10.7)
GFR calc Af Amer: 56
GFR calc non Af Amer: 48
Globulin: 3.3

## 2020-09-14 LAB — BASIC METABOLIC PANEL
BUN: 13 (ref 4–21)
CO2: 28 — AB (ref 13–22)
Chloride: 105 (ref 99–108)
Creatinine: 1 (ref 0.5–1.1)
Glucose: 108
Potassium: 4.1 (ref 3.4–5.3)
Sodium: 140 (ref 137–147)

## 2020-09-14 LAB — CBC AND DIFFERENTIAL
HCT: 41 (ref 36–46)
Hemoglobin: 13.8 (ref 12.0–16.0)
Neutrophils Absolute: 3715
Platelets: 243 (ref 150–399)
WBC: 7.4

## 2020-09-14 LAB — CBC: RBC: 4.41 (ref 3.87–5.11)

## 2020-09-15 ENCOUNTER — Ambulatory Visit (INDEPENDENT_AMBULATORY_CARE_PROVIDER_SITE_OTHER): Payer: Medicare Other | Admitting: Nurse Practitioner

## 2020-09-15 ENCOUNTER — Encounter: Payer: Self-pay | Admitting: Nurse Practitioner

## 2020-09-15 ENCOUNTER — Other Ambulatory Visit: Payer: Self-pay

## 2020-09-15 ENCOUNTER — Ambulatory Visit: Payer: Medicare Other | Admitting: Nurse Practitioner

## 2020-09-15 VITALS — BP 140/80 | HR 66 | Temp 97.1°F | Ht 59.0 in | Wt 128.0 lb

## 2020-09-15 DIAGNOSIS — I10 Essential (primary) hypertension: Secondary | ICD-10-CM | POA: Diagnosis not present

## 2020-09-15 DIAGNOSIS — Z0000 Encounter for general adult medical examination without abnormal findings: Secondary | ICD-10-CM

## 2020-09-15 MED ORDER — AMLODIPINE BESYLATE 5 MG PO TABS: 5.0000 mg | ORAL_TABLET | Freq: Every day | ORAL | 3 refills | Status: DC

## 2020-09-15 NOTE — Patient Instructions (Addendum)
Lori Edwards , Thank you for taking time to come for your Medicare Wellness Visit. I appreciate your ongoing commitment to your health goals. Please review the following plan we discussed and let me know if I can assist you in the future.   Screening recommendations/referrals: Colonoscopy aged out Mammogram aged out Bone Density up to date Recommended yearly ophthalmology/optometry visit for glaucoma screening and checkup Recommended yearly dental visit for hygiene and checkup  Vaccinations: Influenza vaccine completed Pneumococcal vaccine- up to date  Tdap vaccine up to date Shingles vaccine up to date     Advanced directives:  On file, most form completed  Conditions/risks identified: advanced age  Next appointment: 09/17/2020, 1 year for awv   Preventive Care 65 Years and Older, Female Preventive care refers to lifestyle choices and visits with your health care provider that can promote health and wellness. What does preventive care include?  A yearly physical exam. This is also called an annual well check.  Dental exams once or twice a year.  Routine eye exams. Ask your health care provider how often you should have your eyes checked.  Personal lifestyle choices, including:  Daily care of your teeth and gums.  Regular physical activity.  Eating a healthy diet.  Avoiding tobacco and drug use.  Limiting alcohol use.  Practicing safe sex.  Taking low-dose aspirin every day.  Taking vitamin and mineral supplements as recommended by your health care provider. What happens during an annual well check? The services and screenings done by your health care provider during your annual well check will depend on your age, overall health, lifestyle risk factors, and family history of disease. Counseling  Your health care provider may ask you questions about your:  Alcohol use.  Tobacco use.  Drug use.  Emotional well-being.  Home and relationship  well-being.  Sexual activity.  Eating habits.  History of falls.  Memory and ability to understand (cognition).  Work and work Astronomer.  Reproductive health. Screening  You may have the following tests or measurements:  Height, weight, and BMI.  Blood pressure.  Lipid and cholesterol levels. These may be checked every 5 years, or more frequently if you are over 31 years old.  Skin check.  Lung cancer screening. You may have this screening every year starting at age 42 if you have a 30-pack-year history of smoking and currently smoke or have quit within the past 15 years.  Fecal occult blood test (FOBT) of the stool. You may have this test every year starting at age 55.  Flexible sigmoidoscopy or colonoscopy. You may have a sigmoidoscopy every 5 years or a colonoscopy every 10 years starting at age 64.  Hepatitis C blood test.  Hepatitis B blood test.  Sexually transmitted disease (STD) testing.  Diabetes screening. This is done by checking your blood sugar (glucose) after you have not eaten for a while (fasting). You may have this done every 1-3 years.  Bone density scan. This is done to screen for osteoporosis. You may have this done starting at age 28.  Mammogram. This may be done every 1-2 years. Talk to your health care provider about how often you should have regular mammograms. Talk with your health care provider about your test results, treatment options, and if necessary, the need for more tests. Vaccines  Your health care provider may recommend certain vaccines, such as:  Influenza vaccine. This is recommended every year.  Tetanus, diphtheria, and acellular pertussis (Tdap, Td) vaccine. You may need a  Td booster every 10 years.  Zoster vaccine. You may need this after age 86.  Pneumococcal 13-valent conjugate (PCV13) vaccine. One dose is recommended after age 70.  Pneumococcal polysaccharide (PPSV23) vaccine. One dose is recommended after age  69. Talk to your health care provider about which screenings and vaccines you need and how often you need them. This information is not intended to replace advice given to you by your health care provider. Make sure you discuss any questions you have with your health care provider. Document Released: 01/01/2016 Document Revised: 08/24/2016 Document Reviewed: 10/06/2015 Elsevier Interactive Patient Education  2017 [place] Prevention in the Home Falls can cause injuries. They can happen to people of all ages. There are many things you can do to make your home safe and to help prevent falls. What can I do on the outside of my home?  Regularly fix the edges of walkways and driveways and fix any cracks.  Remove anything that might make you trip as you walk through a door, such as a raised step or threshold.  Trim any bushes or trees on the path to your home.  Use bright outdoor lighting.  Clear any walking paths of anything that might make someone trip, such as rocks or tools.  Regularly check to see if handrails are loose or broken. Make sure that both sides of any steps have handrails.  Any raised decks and porches should have guardrails on the edges.  Have any leaves, snow, or ice cleared regularly.  Use sand or salt on walking paths during winter.  Clean up any spills in your garage right away. This includes oil or grease spills. What can I do in the bathroom?  Use night lights.  Install grab bars by the toilet and in the tub and shower. Do not use towel bars as grab bars.  Use non-skid mats or decals in the tub or shower.  If you need to sit down in the shower, use a plastic, non-slip stool.  Keep the floor dry. Clean up any water that spills on the floor as soon as it happens.  Remove soap buildup in the tub or shower regularly.  Attach bath mats securely with double-sided non-slip rug tape.  Do not have throw rugs and other things on the floor that can make  you trip. What can I do in the bedroom?  Use night lights.  Make sure that you have a light by your bed that is easy to reach.  Do not use any sheets or blankets that are too big for your bed. They should not hang down onto the floor.  Have a firm chair that has side arms. You can use this for support while you get dressed.  Do not have throw rugs and other things on the floor that can make you trip. What can I do in the kitchen?  Clean up any spills right away.  Avoid walking on wet floors.  Keep items that you use a lot in easy-to-reach places.  If you need to reach something above you, use a strong step stool that has a grab bar.  Keep electrical cords out of the way.  Do not use floor polish or wax that makes floors slippery. If you must use wax, use non-skid floor wax.  Do not have throw rugs and other things on the floor that can make you trip. What can I do with my stairs?  Do not leave any items on the stairs.  Make sure that there are handrails on both sides of the stairs and use them. Fix handrails that are broken or loose. Make sure that handrails are as long as the stairways.  Check any carpeting to make sure that it is firmly attached to the stairs. Fix any carpet that is loose or worn.  Avoid having throw rugs at the top or bottom of the stairs. If you do have throw rugs, attach them to the floor with carpet tape.  Make sure that you have a light switch at the top of the stairs and the bottom of the stairs. If you do not have them, ask someone to add them for you. What else can I do to help prevent falls?  Wear shoes that:  Do not have high heels.  Have rubber bottoms.  Are comfortable and fit you well.  Are closed at the toe. Do not wear sandals.  If you use a stepladder:  Make sure that it is fully opened. Do not climb a closed stepladder.  Make sure that both sides of the stepladder are locked into place.  Ask someone to hold it for you, if  possible.  Clearly mark and make sure that you can see:  Any grab bars or handrails.  First and last steps.  Where the edge of each step is.  Use tools that help you move around (mobility aids) if they are needed. These include:  Canes.  Walkers.  Scooters.  Crutches.  Turn on the lights when you go into a dark area. Replace any light bulbs as soon as they burn out.  Set up your furniture so you have a clear path. Avoid moving your furniture around.  If any of your floors are uneven, fix them.  If there are any pets around you, be aware of where they are.  Review your medicines with your doctor. Some medicines can make you feel dizzy. This can increase your chance of falling. Ask your doctor what other things that you can do to help prevent falls. This information is not intended to replace advice given to you by your health care provider. Make sure you discuss any questions you have with your health care provider. Document Released: 10/01/2009 Document Revised: 05/12/2016 Document Reviewed: 01/09/2015 Elsevier Interactive Patient Education  2017 [employer].

## 2020-09-15 NOTE — Progress Notes (Signed)
Subjective:   Lori Edwards is a 86 y.o. female who presents for Medicare Annual (Subsequent) preventive examination.  Review of Systems     Cardiac Risk Factors include: advanced age (>46men, >91 women);hypertension     Objective:    Today's Vitals   09/15/20 0909  BP: 140/80  Pulse: 66  Temp: (!) 97.1 F (36.2 C)  SpO2: 99%  Weight: 128 lb (58.1 kg)  Height: 4\' 11"  (1.499 m)   Body mass index is 25.85 kg/m.  Advanced Directives 09/15/2020 05/07/2020 09/13/2019 07/12/2018 07/31/2017 07/31/2017  Does Patient Have a Medical Advance Directive? Yes Yes Yes Yes No No  Type of 08/02/2017 of [place];Living will Healthcare Power of [place];Living will Healthcare Power of [place];Living will Healthcare Power of [place];Living will - -  Does patient want to make changes to medical advance directive? No - Patient declined No - Patient declined No - Patient declined No - Patient declined - -  Copy of Healthcare Power of Attorney in Chart? Yes - validated most recent copy scanned in chart (See row information) Yes - validated most recent copy scanned in chart (See row information) Yes - validated most recent copy scanned in chart (See row information) No - copy requested - -  Would patient like information on creating a medical advance directive? - - - - No - Patient declined No - Patient declined    Current Medications (verified) Outpatient Encounter Medications as of 09/15/2020  Medication Sig   alendronate (FOSAMAX) 70 MG tablet TAKE 1 TABLET BY MOUTH EVERY 7 (SEVEN) DAYS. TAKE WITH A FULL GLASS OF WATER ON AN EMPTY STOMACH.   amLODipine (NORVASC) 5 MG tablet Take 1 tablet (5 mg total) by mouth daily.   cholecalciferol (VITAMIN D3) 25 MCG (1000 UT) tablet Take 1,000 Units by mouth daily.   Multiple Vitamin (MULTIVITAMIN WITH MINERALS) TABS tablet Take 1 tablet by mouth daily.   [DISCONTINUED] amLODipine (NORVASC) 5 MG tablet Take 1 tablet (5 mg total) by  mouth daily.   [DISCONTINUED] SHINGRIX injection    No facility-administered encounter medications on file as of 09/15/2020.    Allergies (verified) Patient has no known allergies.   History: Past Medical History:  Diagnosis Date   Hyperlipidemia    Hypertension    Past Surgical History:  Procedure Laterality Date   OPEN REDUCTION INTERNAL FIXATION (ORIF) DISTAL RADIAL FRACTURE Right 07/31/2017   Procedure: OPEN REDUCTION INTERNAL FIXATION (ORIF) DISTAL RADIAL FRACTURE;  Surgeon: 08/02/2017, MD;  Location: [facility] ORS;  Service: Orthopedics;  Laterality: Right;   Family History  Problem Relation Age of Onset   Stroke Other        Parent   Stroke Mother    Hypertension Mother    Hypertension Father    Social History   Socioeconomic History   Marital status: Widowed    Spouse name: Not on file   Number of children: Not on file   Years of education: Not on file   Highest education level: Not on file  Occupational History   Not on file  Tobacco Use   Smoking status: Never Smoker   Smokeless tobacco: Never Used  Vaping Use   Vaping Use: Never used  Substance and Sexual Activity   Alcohol use: No   Drug use: No   Sexual activity: Not on file  Other Topics Concern   Not on file  Social History Narrative   Not on file   Social Determinants of Health  Financial Resource Strain:    Difficulty of Paying Living Expenses: Not on file  Food Insecurity:    Worried About Programme researcher, broadcasting/film/videounning Out of Food in the Last Year: Not on file   [employer] Out of Food in the Last Year: Not on file  Transportation Needs:    Lack of Transportation (Medical): Not on file   Lack of Transportation (Non-Medical): Not on file  Physical Activity:    Days of Exercise per Week: Not on file   Minutes of Exercise per Session: Not on file  Stress:    Feeling of Stress : Not on file  Social Connections:    Frequency of Communication with Friends and Family: Not on file    Frequency of Social Gatherings with Friends and Family: Not on file   Attends Religious Services: Not on file   Active Member of Clubs or Organizations: Not on file   Attends BankerClub or Organization Meetings: Not on file   Marital Status: Not on file    Tobacco Counseling Counseling given: Not Answered   Clinical Intake:  Pre-visit preparation completed: Yes  Pain : No/denies pain     BMI - recorded: 25 Nutritional Status: BMI 25 -29 Overweight Nutritional Risks: None  How often do you need to have someone help you when you read instructions, pamphlets, or other written materials from your doctor or pharmacy?: 1 - Never  Diabetic?yes         Activities of Daily Living In your present state of health, do you have any difficulty performing the following activities: 09/15/2020  Hearing? N  Vision? N  Difficulty concentrating or making decisions? N  Walking or climbing stairs? N  Dressing or bathing? N  Doing errands, shopping? N  Preparing Food and eating ? N  Using the Toilet? N  In the past six months, have you accidently leaked urine? N  Do you have problems with loss of bowel control? N  Managing your Medications? N  Managing your Finances? N  Housekeeping or managing your Housekeeping? N  Some recent data might be hidden    Patient Care Team: Sharon SellerEubanks, Emanuela Runnion K, NP as PCP - General (Geriatric Medicine)  Indicate any recent Medical Services you may have received from other than [facility] providers in the past year (date may be approximate).     Assessment:   This is a routine wellness examination for Lori Edwards.  Hearing/Vision screen  Hearing Screening   125Hz  250Hz  500Hz  1000Hz  2000Hz  3000Hz  4000Hz  6000Hz  8000Hz   Right ear:           Left ear:           Comments: Patient has no hearing problems  Vision Screening Comments: Patient has no vision problems. She has appointment for eye exam scheduled for October 2021.  Dietary issues and exercise activities  discussed: Current Exercise Habits: Structured exercise class;Home exercise routine, Type of exercise: calisthenics;Other - see comments (dance), Time (Minutes): 30, Frequency (Times/Week): 4, Weekly Exercise (Minutes/Week): 120  Goals     DIET - INCREASE WATER INTAKE     Patient Stated     To maintain current lifestyle       Depression Screen PHQ 2/9 Scores 09/15/2020 05/07/2020 01/24/2020 09/13/2019 07/12/2018 02/22/2017  PHQ - 2 Score 0 0 0 0 0 0    Fall Risk Fall Risk  09/15/2020 05/07/2020 01/24/2020 09/13/2019 09/13/2019  Falls in the past year? 0 0 0 0 0  Number falls in past yr: 0 0 0 0 -  Injury with Fall? 0 0 - - -  Follow up - - Falls evaluation completed Falls evaluation completed -    Any stairs in or around the home? No  If so, are there any without handrails? No  Home free of loose throw rugs in walkways, pet beds, electrical cords, etc? Yes  Adequate lighting in your home to reduce risk of falls? Yes   ASSISTIVE DEVICES UTILIZED TO PREVENT FALLS:  Life alert? No  Use of a cane, walker or w/c? No  Grab bars in the bathroom? Yes  Shower chair or bench in shower? yes Elevated toilet seat or a handicapped toilet? no  TIMED UP AND GO:  Was the test performed? No .    Gait steady and fast without use of assistive device  Cognitive Function: MMSE - Mini Mental State Exam 09/15/2020 07/12/2018  Orientation to time 5 5  Orientation to Place 5 5  Registration 3 3  Attention/ Calculation 5 5  Recall 3 3  Language- name 2 objects 2 2  Language- repeat 1 1  Language- follow 3 step command 3 3  Language- read & follow direction 1 1  Write a sentence 1 1  Copy design 1 1  Total score 30 30     6CIT Screen 09/13/2019  What Year? 0 points  What month? 0 points  What time? 0 points  Count back from 20 0 points  Months in reverse 0 points  Repeat phrase 0 points  Total Score 0    Immunizations Immunization History  Administered Date(s) Administered   Fluad  Quad(high Dose 65+) 08/29/2019   Influenza, High Dose Seasonal PF 08/25/2016, 09/07/2017, 09/10/2018, 09/12/2020   Moderna SARS-COVID-2 Vaccination 12/31/2019, 01/28/2020   Pneumococcal Conjugate-13 08/03/2017   Tdap 09/10/2018   Zoster Recombinat (Shingrix) 09/13/2019, 03/07/2020    TDAP status: Up to date Flu Vaccine status: Up to date Due for Pneumococcal 23 Covid-19 vaccine status: Completed vaccines  Qualifies for Shingles Vaccine? Yes   Zostavax completed No   Shingrix Completed?: Yes  Screening Tests Health Maintenance  Topic Date Due   TETANUS/TDAP  09/10/2028   INFLUENZA VACCINE  Completed   DEXA SCAN  Completed   COVID-19 Vaccine  Completed   PNA vac Low Risk Adult  Completed    Health Maintenance  There are no preventive care reminders to display for this patient.  Colorectal cancer screening: No longer required.  Mammogram status: No longer required.  Bone Density status: Completed 05/2019. Results reflect: Bone density results: OSTEOPOROSIS. Repeat every 2 years.  Lung Cancer Screening: (Low Dose CT Chest recommended if Age 62-80 years, 30 pack-year currently smoking OR have quit w/in 15years.) does not qualify.   Lung Cancer Screening Referral: na  Additional Screening:  Hepatitis C Screening: does not qualify; Completed na  Vision Screening: Recommended annual ophthalmology exams for early detection of glaucoma and other disorders of the eye. Is the patient up to date with their annual eye exam?  Yes  Who is the provider or what is the name of the office in which the patient attends annual eye exams? Dr Brooke Dare If pt is not established with a provider, would they like to be referred to a provider to establish care? No .   Dental Screening: Recommended annual dental exams for proper oral hygiene  Community Resource Referral / Chronic Care Management: CRR required this visit?  No   CCM required this visit?  No      Plan:  I have  personally reviewed and noted the following in the patients chart:    Medical and social history  Use of alcohol, tobacco or illicit drugs   Current medications and supplements  Functional ability and status  Nutritional status  Physical activity  Advanced directives  List of other physicians  Hospitalizations, surgeries, and ER visits in previous 12 months  Vitals  Screenings to include cognitive, depression, and falls  Referrals and appointments  In addition, I have reviewed and discussed with patient certain preventive protocols, quality metrics, and best practice recommendations. A written personalized care plan for preventive services as well as general preventive health recommendations were provided to patient.     Sharon Seller, NP   09/15/2020

## 2020-09-17 ENCOUNTER — Ambulatory Visit: Payer: Self-pay | Admitting: Nurse Practitioner

## 2020-09-24 ENCOUNTER — Encounter: Payer: Self-pay | Admitting: Nurse Practitioner

## 2020-09-24 ENCOUNTER — Ambulatory Visit: Payer: Medicare Other | Admitting: Nurse Practitioner

## 2020-09-24 ENCOUNTER — Other Ambulatory Visit: Payer: Self-pay

## 2020-09-24 VITALS — BP 130/70 | HR 67 | Temp 97.5°F | Ht 59.0 in | Wt 128.0 lb

## 2020-09-24 DIAGNOSIS — E785 Hyperlipidemia, unspecified: Secondary | ICD-10-CM

## 2020-09-24 DIAGNOSIS — M810 Age-related osteoporosis without current pathological fracture: Secondary | ICD-10-CM

## 2020-09-24 DIAGNOSIS — R739 Hyperglycemia, unspecified: Secondary | ICD-10-CM

## 2020-09-24 DIAGNOSIS — I10 Essential (primary) hypertension: Secondary | ICD-10-CM | POA: Diagnosis not present

## 2020-09-24 DIAGNOSIS — N1831 Chronic kidney disease, stage 3a: Secondary | ICD-10-CM | POA: Diagnosis not present

## 2020-09-24 NOTE — Progress Notes (Signed)
Careteam: Patient Care Team: Sharon Seller, NP as PCP - General (Geriatric Medicine)  Advanced Directive information Does Patient Have a Medical Advance Directive?: Yes, Type of Advance Directive: Healthcare Power of [place];Living will, Does patient want to make changes to medical advance directive?: No - Patient declined  No Known Allergies  Chief Complaint  Patient presents with  . Medical Management of Chronic Issues    4 month followo up visit. Patient states she is in good health     HPI: Patient is a 86 y.o. female seen in [facility] at the [facility] for routine follow up.  Reports she is doing well. Had labs done last week and AWV  Continues on fosamax for OP- with vit d with exericse.     Review of Systems:  Review of Systems  Constitutional: Negative for chills, fever and weight loss.  HENT: Negative for tinnitus.   Respiratory: Negative for cough, sputum production and shortness of breath.   Cardiovascular: Negative for chest pain, palpitations and leg swelling.  Gastrointestinal: Negative for abdominal pain, constipation, diarrhea and heartburn.  Genitourinary: Negative for dysuria, frequency and urgency.  Musculoskeletal: Negative for back pain, falls, joint pain and myalgias.  Skin: Negative.   Neurological: Negative for dizziness and headaches.  Psychiatric/Behavioral: Negative for depression and memory loss. The patient does not have insomnia.    Past Medical History:  Diagnosis Date  . Hyperlipidemia   . Hypertension    Past Surgical History:  Procedure Laterality Date  . OPEN REDUCTION INTERNAL FIXATION (ORIF) DISTAL RADIAL FRACTURE Right 07/31/2017   Procedure: OPEN REDUCTION INTERNAL FIXATION (ORIF) DISTAL RADIAL FRACTURE;  Surgeon: Deeann Saint, MD;  Location: [facility] ORS;  Service: Orthopedics;  Laterality: Right;   Social History:   reports that she has never smoked. She has never used smokeless tobacco. She reports that she does not  drink alcohol and does not use drugs.  Family History  Problem Relation Age of Onset  . Stroke Other        Parent  . Stroke Mother   . Hypertension Mother   . Hypertension Father     Medications: Patient's Medications  New Prescriptions   No medications on file  Previous Medications   ALENDRONATE (FOSAMAX) 70 MG TABLET    TAKE 1 TABLET BY MOUTH EVERY 7 (SEVEN) DAYS. TAKE WITH A FULL GLASS OF WATER ON AN EMPTY STOMACH.   AMLODIPINE (NORVASC) 5 MG TABLET    Take 1 tablet (5 mg total) by mouth daily.   CHOLECALCIFEROL (VITAMIN D3) 25 MCG (1000 UT) TABLET    Take 1,000 Units by mouth daily.   MULTIPLE VITAMIN (MULTIVITAMIN WITH MINERALS) TABS TABLET    Take 1 tablet by mouth daily.   RED YEAST RICE 600 MG CAPS    Take 1 capsule by mouth 2 (two) times daily.  Modified Medications   No medications on file  Discontinued Medications   No medications on file    Physical Exam:  Vitals:   09/24/20 0906  BP: 130/70  Pulse: 67  Temp: (!) 97.5 F (36.4 C)  TempSrc: Temporal  SpO2: 97%  Weight: 128 lb (58.1 kg)  Height: 4\' 11"  (1.499 m)   Body mass index is 25.85 kg/m. Wt Readings from Last 3 Encounters:  09/24/20 128 lb (58.1 kg)  09/15/20 128 lb (58.1 kg)  05/07/20 128 lb (58.1 kg)    Physical Exam Constitutional:      General: She is not in acute distress.  Appearance: She is well-developed. She is not diaphoretic.  HENT:     Head: Normocephalic and atraumatic.     Mouth/Throat:     Pharynx: No oropharyngeal exudate.  Eyes:     Conjunctiva/sclera: Conjunctivae normal.     Pupils: Pupils are equal, round, and reactive to light.  Cardiovascular:     Rate and Rhythm: Normal rate and regular rhythm.     Heart sounds: Normal heart sounds.  Pulmonary:     Effort: Pulmonary effort is normal.     Breath sounds: Normal breath sounds.  Abdominal:     General: Bowel sounds are normal.     Palpations: Abdomen is soft.  Musculoskeletal:        General: No tenderness.      Cervical back: Normal range of motion and neck supple.  Skin:    General: Skin is warm and dry.  Neurological:     Mental Status: She is alert and oriented to person, place, and time.  Psychiatric:        Mood and Affect: Mood normal.        Behavior: Behavior normal.     Labs reviewed: Basic Metabolic Panel: Recent Labs    09/14/20 0000  NA 140  K 4.1  CL 105  CO2 28*  BUN 13  CREATININE 1.0  CALCIUM 9.8   Liver Function Tests: Recent Labs    09/14/20 0000  AST 21  ALT 18  ALKPHOS 60  ALBUMIN 4.2   No results for input(s): LIPASE, AMYLASE in the last 8760 hours. No results for input(s): AMMONIA in the last 8760 hours. CBC: Recent Labs    09/14/20 0000  WBC 7.4  NEUTROABS 3,715  HGB 13.8  HCT 41  PLT 243   Lipid Panel: No results for input(s): CHOL, HDL, LDLCALC, TRIG, CHOLHDL, LDLDIRECT in the last 8760 hours. TSH: No results for input(s): TSH in the last 8760 hours. A1C: No results found for: HGBA1C   Assessment/Plan 1. Essential hypertension Well controlled. Continues on norvasc 5 mg. Cooks at home for low sodium diet Very active, continues with exercise.   2. Age-related osteoporosis without current pathological fracture Continues on fosamax with vit d and weight bearing activity   3. Hyperlipidemia, unspecified hyperlipidemia type -does not wish to be on medication, continues on red yeast rice supplement.   4. Chronic kidney disease, stage 3a (HCC) -Encourage proper hydration and to avoid NSAIDS (Aleve, Advil, Motrin, Ibuprofen)   5. Hyperglycemia Mild elevation in glucose, will get a1c in 6 months, continue dietary modifications with increase in physical activity.   Next appt: 6 months, labs prior to appt.  Janene Harvey. Biagio Borg  [facility] [phone]

## 2020-09-30 DIAGNOSIS — H2513 Age-related nuclear cataract, bilateral: Secondary | ICD-10-CM | POA: Diagnosis not present

## 2020-10-30 DIAGNOSIS — Z23 Encounter for immunization: Secondary | ICD-10-CM | POA: Diagnosis not present

## 2020-11-22 IMAGING — US US CAROTID DUPLEX BILAT
1 series · 13 of 24 positions shown · non-contrast
Comparison: None.

CLINICAL DATA: Chronic calcification on x-ray. History of
hypertension and hyperlipidemia.

EXAM:
BILATERAL CAROTID DUPLEX ULTRASOUND
TECHNIQUE: Gray scale imaging, color Doppler and duplex ultrasound were
performed of bilateral carotid and vertebral arteries in the neck.

[Series 1: us carotid duplex bilat · 13 of 64 slices shown]
[im 1/64]
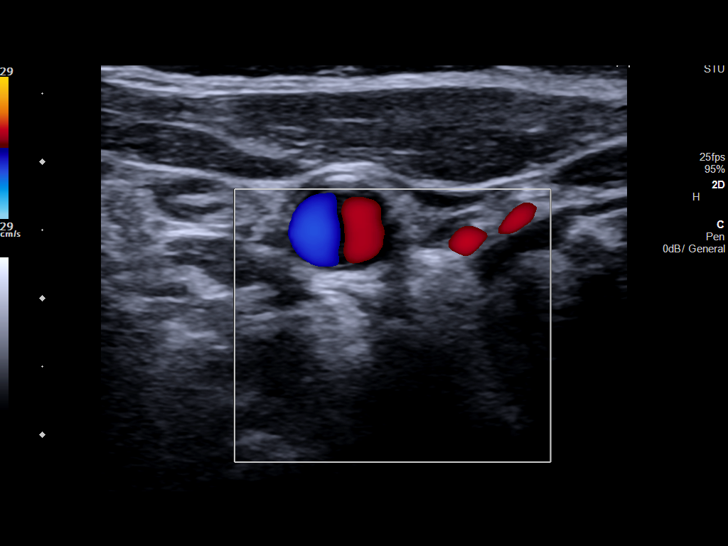
[im 6/64]
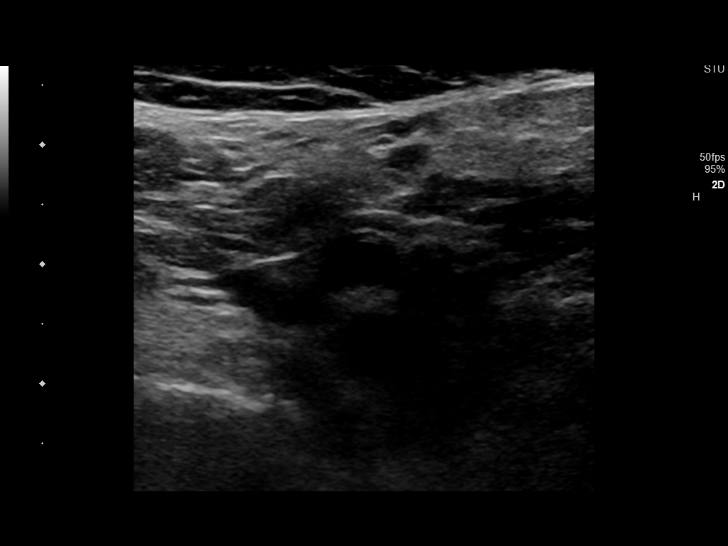
[im 11/64]
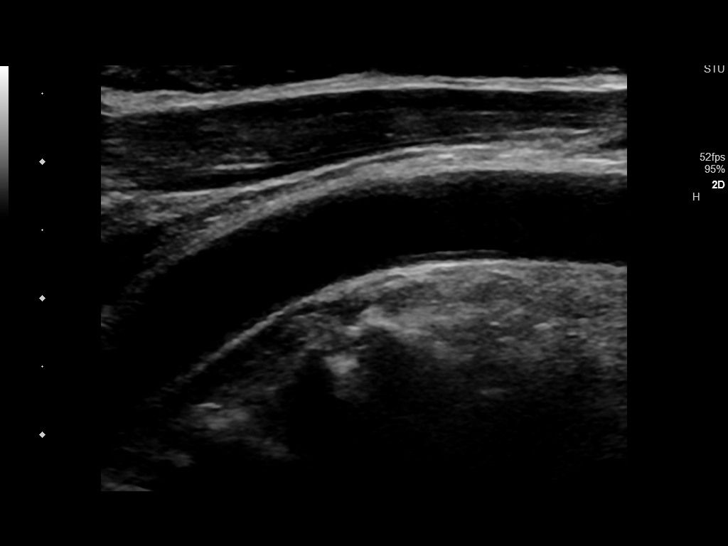
[im 17/64]
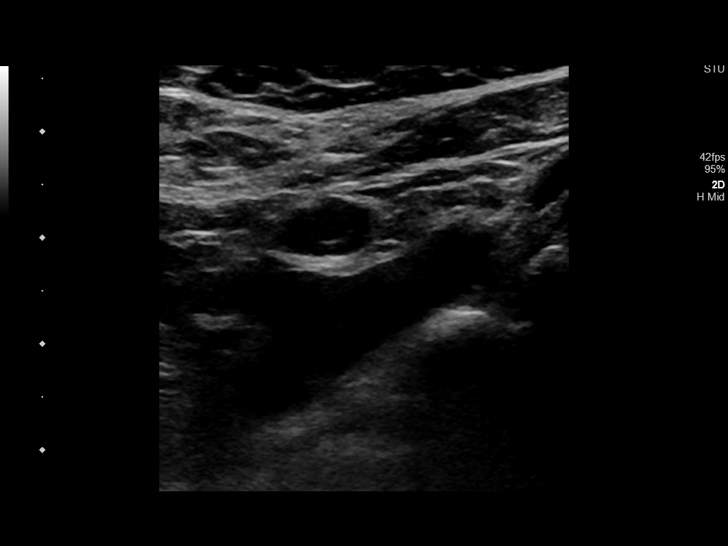
[im 22/64]
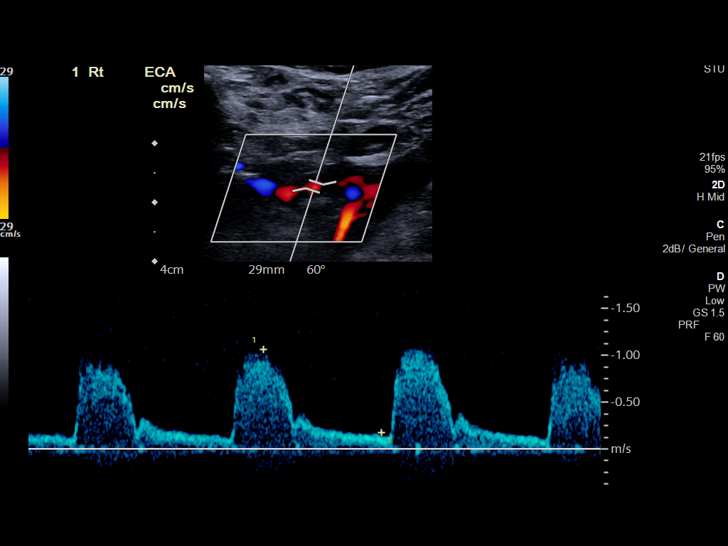
[im 28/64]
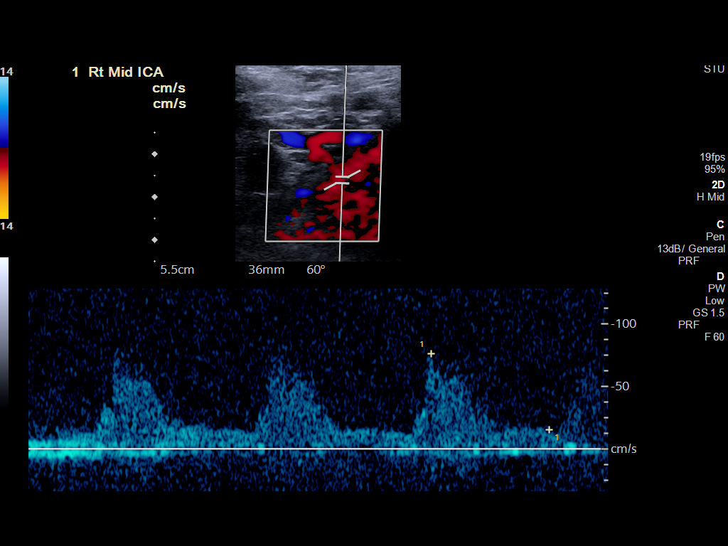
[im 33/64]
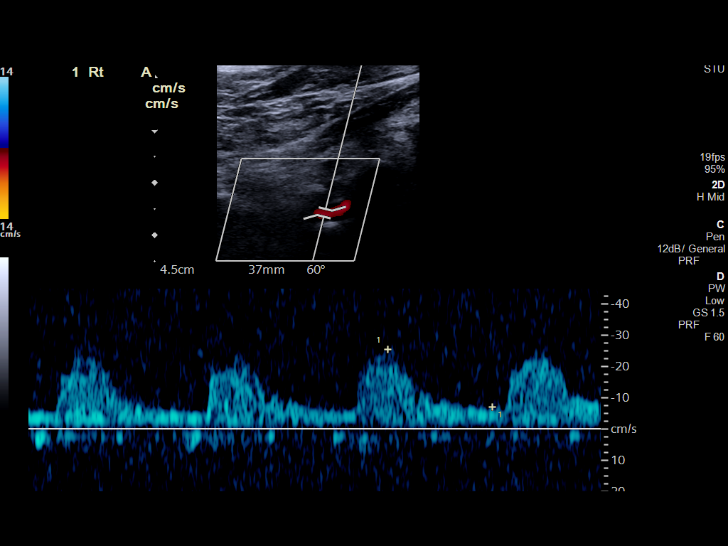
[im 36/64]
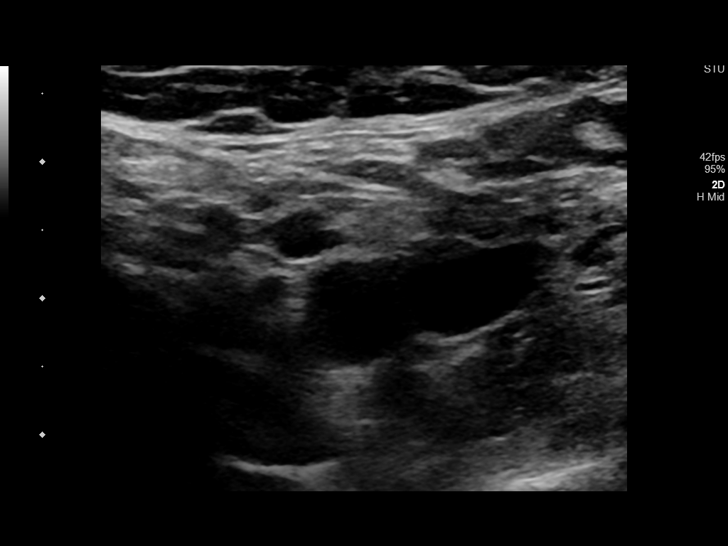
[im 42/64]
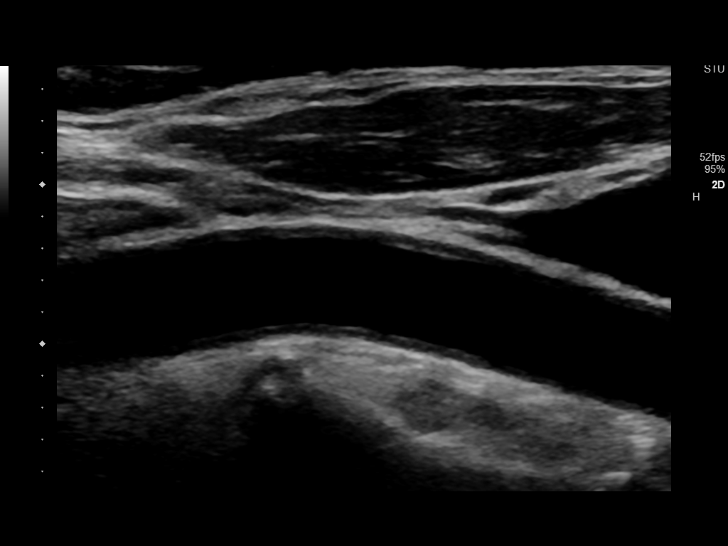
[im 47/64]
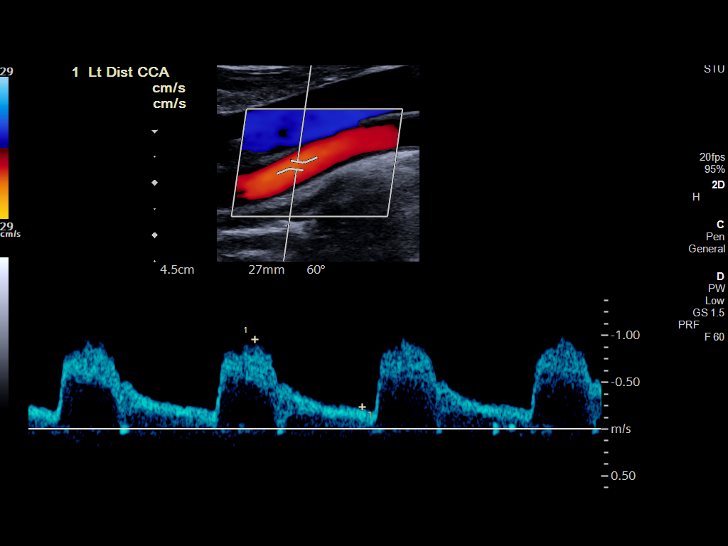
[im 53/64]
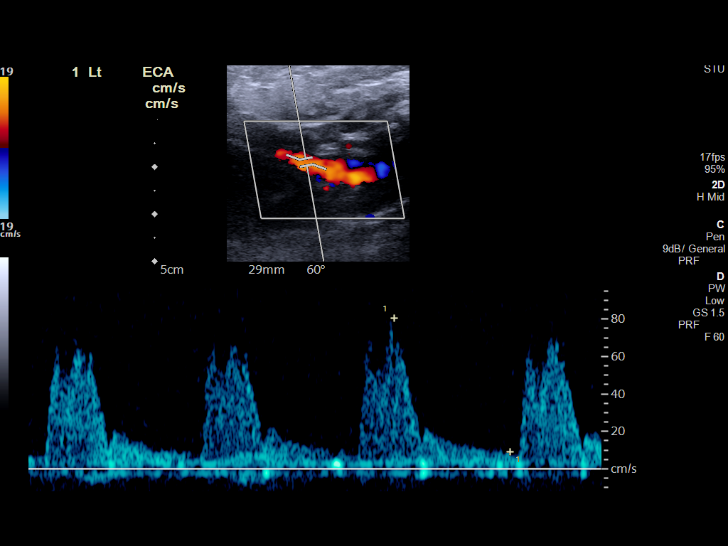
[im 58/64]
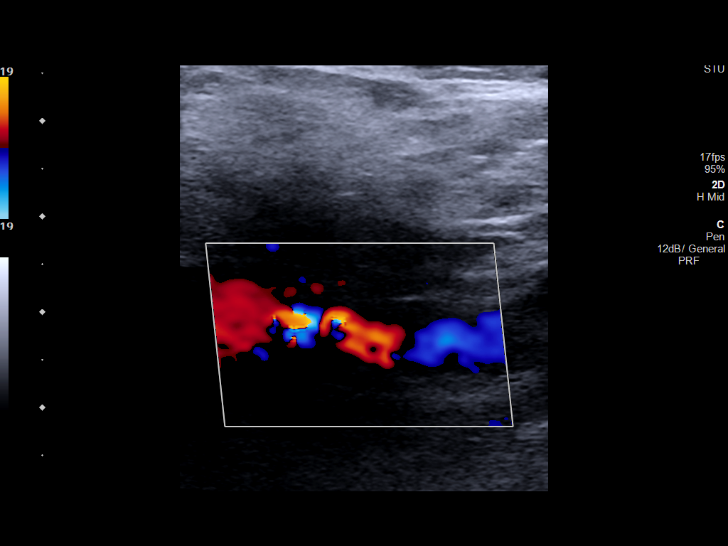
[im 64/64]
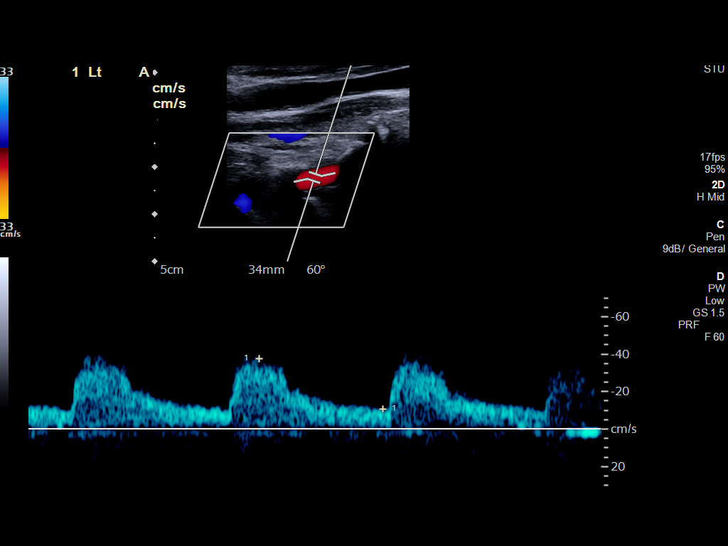

[13 of 24 positions shown; findings below may reference images not displayed]

FINDINGS: Criteria: Quantification of carotid stenosis is based on velocity
parameters that correlate the residual internal carotid diameter
with NASCET-based stenosis levels, using the diameter of the distal
internal carotid lumen as the denominator for stenosis measurement.

The following velocity measurements were obtained:

RIGHT

ICA: 119/30 cm/sec

CCA: 97/22 cm/sec

SYSTOLIC ICA/CCA RATIO:

ECA: 106 cm/sec

LEFT

ICA: 91/25 cm/sec

CCA: 96/24 cm/sec

SYSTOLIC ICA/CCA RATIO:

ECA: 81 cm/sec

RIGHT CAROTID ARTERY: There is a minimal amount of eccentric
hypoechoic plaque involving the origin and proximal aspects of the
right internal carotid artery (image 24), not resulting in elevated
peak systolic velocities within the interrogated course the right
internal carotid artery to suggest a hemodynamically significant
stenosis. Borderline elevated peak systolic velocity within the
distal aspect of the right internal carotid artery is felt to be
factitiously elevated due to sampling at a location of vessel
tortuosity.

RIGHT VERTEBRAL ARTERY:  Antegrade Flow

LEFT CAROTID ARTERY: There is a minimal to moderate amount of
eccentric mixed echogenic partially shadowing plaque within the left
carotid bulb (image 49) not resulting in elevated peak systolic
velocities within the interrogated course the left internal carotid
artery to suggest a hemodynamically significant stenosis.

LEFT VERTEBRAL ARTERY:  Antegrade flow
IMPRESSION: Minimal to moderate amount of bilateral atherosclerotic plaque, left
greater than right, not resulting in a hemodynamically significant
stenosis within either internal carotid artery.

## 2021-03-18 DIAGNOSIS — I10 Essential (primary) hypertension: Secondary | ICD-10-CM | POA: Diagnosis not present

## 2021-03-18 DIAGNOSIS — R739 Hyperglycemia, unspecified: Secondary | ICD-10-CM | POA: Diagnosis not present

## 2021-03-19 LAB — BASIC METABOLIC PANEL
BUN: 20 (ref 4–21)
CO2: 27 — AB (ref 13–22)
Chloride: 106 (ref 99–108)
Creatinine: 1 (ref 0.5–1.1)
Glucose: 95
Potassium: 3.9 (ref 3.4–5.3)
Sodium: 136 — AB (ref 137–147)

## 2021-03-19 LAB — COMPREHENSIVE METABOLIC PANEL
Calcium: 9.7 (ref 8.7–10.7)
GFR calc Af Amer: 61
GFR calc non Af Amer: 53

## 2021-03-19 LAB — HEMOGLOBIN A1C: Hemoglobin A1C: 5.7

## 2021-03-25 ENCOUNTER — Other Ambulatory Visit: Payer: Self-pay

## 2021-03-25 ENCOUNTER — Ambulatory Visit: Payer: Medicare Other | Admitting: Nurse Practitioner

## 2021-03-25 ENCOUNTER — Encounter: Payer: Self-pay | Admitting: Nurse Practitioner

## 2021-03-25 VITALS — BP 130/80 | HR 66 | Temp 98.4°F | Ht 59.0 in | Wt 124.5 lb

## 2021-03-25 DIAGNOSIS — I10 Essential (primary) hypertension: Secondary | ICD-10-CM

## 2021-03-25 DIAGNOSIS — E785 Hyperlipidemia, unspecified: Secondary | ICD-10-CM

## 2021-03-25 DIAGNOSIS — N1831 Chronic kidney disease, stage 3a: Secondary | ICD-10-CM

## 2021-03-25 DIAGNOSIS — M810 Age-related osteoporosis without current pathological fracture: Secondary | ICD-10-CM | POA: Diagnosis not present

## 2021-03-25 DIAGNOSIS — R739 Hyperglycemia, unspecified: Secondary | ICD-10-CM

## 2021-03-25 MED ORDER — ALENDRONATE SODIUM 70 MG PO TABS: ORAL_TABLET | ORAL | 3 refills | Status: DC

## 2021-03-25 NOTE — Progress Notes (Signed)
Careteam: Patient Care Team: Sharon Seller, NP as PCP - General (Geriatric Medicine)  Advanced Directive information Does Patient Have a Medical Advance Directive?: Yes, Type of Advance Directive: Out of facility DNR (pink MOST or yellow form), Pre-existing out of facility DNR order (yellow form or pink MOST form): Pink MOST form placed in chart (order not valid for inpatient use), Does patient want to make changes to medical advance directive?: No - Patient declined  No Known Allergies  Chief Complaint  Patient presents with  . Medical Management of Chronic Issues    6 month follow up. Patient received COVID booster at [facility], but doesn't remember date     HPI: Patient is a 87 y.o. female seen in today at the [facility] for routine follow up. No pain, doing well.   htn- well controlled on norvasc 5 mg daily  Osteoporosis- fosamax and weight bearing activity- cardio dance 3 times a week and line dance 3 times a week  Hyperlipidemia- on red yeast rice, does not wish for medication  CKD- staying hydrated  Hyperglycemia- a1c 5.7   Review of Systems:  Review of Systems  Constitutional: Negative for chills, fever and weight loss.  HENT: Negative for tinnitus.   Respiratory: Negative for cough, sputum production and shortness of breath.   Cardiovascular: Negative for chest pain, palpitations and leg swelling.  Gastrointestinal: Negative for abdominal pain, constipation, diarrhea and heartburn.  Genitourinary: Negative for dysuria, frequency and urgency.  Musculoskeletal: Negative for back pain, falls, joint pain and myalgias.  Skin: Negative.   Neurological: Negative for dizziness and headaches.  Psychiatric/Behavioral: Negative for depression and memory loss. The patient does not have insomnia.    Past Medical History:  Diagnosis Date  . Hyperlipidemia   . Hypertension    Past Surgical History:  Procedure Laterality Date  . OPEN REDUCTION INTERNAL  FIXATION (ORIF) DISTAL RADIAL FRACTURE Right 07/31/2017   Procedure: OPEN REDUCTION INTERNAL FIXATION (ORIF) DISTAL RADIAL FRACTURE;  Surgeon: Deeann Saint, MD;  Location: [facility] ORS;  Service: Orthopedics;  Laterality: Right;   Social History:   reports that she has never smoked. She has never used smokeless tobacco. She reports that she does not drink alcohol and does not use drugs.  Family History  Problem Relation Age of Onset  . Stroke Other        Parent  . Stroke Mother   . Hypertension Mother   . Hypertension Father     Medications: Patient's Medications  New Prescriptions   No medications on file  Previous Medications   ALENDRONATE (FOSAMAX) 70 MG TABLET    TAKE 1 TABLET BY MOUTH EVERY 7 (SEVEN) DAYS. TAKE WITH A FULL GLASS OF WATER ON AN EMPTY STOMACH.   AMLODIPINE (NORVASC) 5 MG TABLET    Take 1 tablet (5 mg total) by mouth daily.   CHOLECALCIFEROL (VITAMIN D3) 25 MCG (1000 UT) TABLET    Take 1,000 Units by mouth daily.   CO-ENZYME Q-10 30 MG CAPSULE    Take 30 mg by mouth 3 (three) times daily.   MULTIPLE VITAMIN (MULTIVITAMIN WITH MINERALS) TABS TABLET    Take 1 tablet by mouth daily.   RED YEAST RICE 600 MG CAPS    Take 1 capsule by mouth 2 (two) times daily.  Modified Medications   No medications on file  Discontinued Medications   No medications on file    Physical Exam:  Vitals:   03/25/21 0901  BP: 130/80  Pulse:  66  Temp: 98.4 F (36.9 C)  TempSrc: Oral  SpO2: 97%  Weight: 124 lb 8 oz (56.5 kg)  Height: 4\' 11"  (1.499 m)   Body mass index is 25.15 kg/m. Wt Readings from Last 3 Encounters:  03/25/21 124 lb 8 oz (56.5 kg)  09/24/20 128 lb (58.1 kg)  09/15/20 128 lb (58.1 kg)    Physical Exam Constitutional:      General: She is not in acute distress.    Appearance: She is well-developed. She is not diaphoretic.  HENT:     Head: Normocephalic and atraumatic.     Mouth/Throat:     Pharynx: No oropharyngeal exudate.  Eyes:      Conjunctiva/sclera: Conjunctivae normal.     Pupils: Pupils are equal, round, and reactive to light.  Cardiovascular:     Rate and Rhythm: Normal rate and regular rhythm.     Heart sounds: Normal heart sounds.  Pulmonary:     Effort: Pulmonary effort is normal.     Breath sounds: Normal breath sounds.  Abdominal:     General: Bowel sounds are normal.     Palpations: Abdomen is soft.  Musculoskeletal:        General: No tenderness.     Cervical back: Normal range of motion and neck supple.  Skin:    General: Skin is warm and dry.  Neurological:     Mental Status: She is alert and oriented to person, place, and time.     Labs reviewed: Basic Metabolic Panel: Recent Labs    09/14/20 0000 03/19/21 0000  NA 140 136*  K 4.1 3.9  CL 105 106  CO2 28* 27*  BUN 13 20  CREATININE 1.0 1.0  CALCIUM 9.8 9.7   Liver Function Tests: Recent Labs    09/14/20 0000  AST 21  ALT 18  ALKPHOS 60  ALBUMIN 4.2   No results for input(s): LIPASE, AMYLASE in the last 8760 hours. No results for input(s): AMMONIA in the last 8760 hours. CBC: Recent Labs    09/14/20 0000  WBC 7.4  NEUTROABS 3,715  HGB 13.8  HCT 41  PLT 243   Lipid Panel: No results for input(s): CHOL, HDL, LDLCALC, TRIG, CHOLHDL, LDLDIRECT in the last 8760 hours. TSH: No results for input(s): TSH in the last 8760 hours. A1C: Lab Results  Component Value Date   HGBA1C 5.7 03/19/2021     Assessment/Plan 1. Age-related osteoporosis without current pathological fracture -continues on fosamax 70 mg weekly  - alendronate (FOSAMAX) 70 MG tablet; TAKE 1 TABLET BY MOUTH EVERY 7 (SEVEN) DAYS. TAKE WITH A FULL GLASS OF WATER ON AN EMPTY STOMACH.  Dispense: 12 tablet; Refill: 3  2. Hyperlipidemia, unspecified hyperlipidemia type - does not wish to follow up, continues on red yeast rice with dietary modifications.   3. Essential hypertension -blood pressure stable, continues on norvasc 5 mg by mouth daily with dietary  modifications.  4. Chronic kidney disease, stage 3a (HCC) -Encourage proper hydration and to avoid NSAIDS (Aleve, Advil, Motrin, Ibuprofen)   5. Hyperglycemia -A1c at goal. Continue dietary modifications.   Next appt: 6 months, labs prior  Lakeita Panther K. 02-16-1976  [facility] [phone]

## 2021-05-06 DIAGNOSIS — Z23 Encounter for immunization: Secondary | ICD-10-CM | POA: Diagnosis not present

## 2021-09-09 DIAGNOSIS — Z23 Encounter for immunization: Secondary | ICD-10-CM | POA: Diagnosis not present

## 2021-09-16 DIAGNOSIS — N1831 Chronic kidney disease, stage 3a: Secondary | ICD-10-CM | POA: Diagnosis not present

## 2021-09-16 DIAGNOSIS — I10 Essential (primary) hypertension: Secondary | ICD-10-CM | POA: Diagnosis not present

## 2021-09-16 LAB — HEPATIC FUNCTION PANEL
ALT: 17 (ref 7–35)
AST: 21 (ref 13–35)
Alkaline Phosphatase: 48 (ref 25–125)
Bilirubin, Total: 0.7

## 2021-09-16 LAB — CBC AND DIFFERENTIAL
HCT: 40 (ref 36–46)
Hemoglobin: 13.8 (ref 12.0–16.0)
Platelets: 296 (ref 150–399)
WBC: 8.4

## 2021-09-16 LAB — BASIC METABOLIC PANEL
BUN: 15 (ref 4–21)
CO2: 27 — AB (ref 13–22)
Chloride: 105 (ref 99–108)
Creatinine: 0.9 (ref 0.5–1.1)
Glucose: 93
Potassium: 4 (ref 3.4–5.3)
Sodium: 139 (ref 137–147)

## 2021-09-16 LAB — CBC: RBC: 4.41 (ref 3.87–5.11)

## 2021-09-16 LAB — COMPREHENSIVE METABOLIC PANEL
Albumin: 4.3 (ref 3.5–5.0)
Calcium: 9.6 (ref 8.7–10.7)
Globulin: 3.3

## 2021-09-17 ENCOUNTER — Encounter: Payer: Self-pay | Admitting: *Deleted

## 2021-09-21 ENCOUNTER — Other Ambulatory Visit: Payer: Self-pay

## 2021-09-21 ENCOUNTER — Ambulatory Visit (INDEPENDENT_AMBULATORY_CARE_PROVIDER_SITE_OTHER): Payer: Medicare Other | Admitting: Nurse Practitioner

## 2021-09-21 ENCOUNTER — Encounter: Payer: Self-pay | Admitting: Nurse Practitioner

## 2021-09-21 VITALS — BP 130/80 | HR 64 | Temp 98.0°F | Ht 59.0 in | Wt 129.0 lb

## 2021-09-21 DIAGNOSIS — E2839 Other primary ovarian failure: Secondary | ICD-10-CM

## 2021-09-21 DIAGNOSIS — Z0000 Encounter for general adult medical examination without abnormal findings: Secondary | ICD-10-CM | POA: Diagnosis not present

## 2021-09-21 NOTE — Patient Instructions (Addendum)
Lori Edwards , Thank you for taking time to come for your Medicare Wellness Visit. I appreciate your ongoing commitment to your health goals. Please review the following plan we discussed and let me know if I can assist you in the future.   Screening recommendations/referrals: Colonoscopy aged out Mammogram aged out Bone Density ordered today Recommended yearly ophthalmology/optometry visit for glaucoma screening and checkup Recommended yearly dental visit for hygiene and checkup  Vaccinations: Influenza vaccine to get at [facility] Pneumococcal vaccine up to date Tdap vaccine up to date Shingles vaccine  up to date    Advanced directives: on file.   Conditions/risks identified: advance age, fall risk, hypertension, hyperlipidemia   Next appointment: 1 year for AWV    Preventive Care 38 Years and Older, Female Preventive care refers to lifestyle choices and visits with your health care provider that can promote health and wellness. What does preventive care include? A yearly physical exam. This is also called an annual well check. Dental exams once or twice a year. Routine eye exams. Ask your health care provider how often you should have your eyes checked. Personal lifestyle choices, including: Daily care of your teeth and gums. Regular physical activity. Eating a healthy diet. Avoiding tobacco and drug use. Limiting alcohol use. Practicing safe sex. Taking low-dose aspirin every day. Taking vitamin and mineral supplements as recommended by your health care provider. What happens during an annual well check? The services and screenings done by your health care provider during your annual well check will depend on your age, overall health, lifestyle risk factors, and family history of disease. Counseling  Your health care provider may ask you questions about your: Alcohol use. Tobacco use. Drug use. Emotional well-being. Home and relationship well-being. Sexual  activity. Eating habits. History of falls. Memory and ability to understand (cognition). Work and work Astronomer. Reproductive health. Screening  You may have the following tests or measurements: Height, weight, and BMI. Blood pressure. Lipid and cholesterol levels. These may be checked every 5 years, or more frequently if you are over 71 years old. Skin check. Lung cancer screening. You may have this screening every year starting at age 48 if you have a 30-pack-year history of smoking and currently smoke or have quit within the past 15 years. Fecal occult blood test (FOBT) of the stool. You may have this test every year starting at age 53. Flexible sigmoidoscopy or colonoscopy. You may have a sigmoidoscopy every 5 years or a colonoscopy every 10 years starting at age 14. Hepatitis C blood test. Hepatitis B blood test. Sexually transmitted disease (STD) testing. Diabetes screening. This is done by checking your blood sugar (glucose) after you have not eaten for a while (fasting). You may have this done every 1-3 years. Bone density scan. This is done to screen for osteoporosis. You may have this done starting at age 9. Mammogram. This may be done every 1-2 years. Talk to your health care provider about how often you should have regular mammograms. Talk with your health care provider about your test results, treatment options, and if necessary, the need for more tests. Vaccines  Your health care provider may recommend certain vaccines, such as: Influenza vaccine. This is recommended every year. Tetanus, diphtheria, and acellular pertussis (Tdap, Td) vaccine. You may need a Td booster every 10 years. Zoster vaccine. You may need this after age 62. Pneumococcal 13-valent conjugate (PCV13) vaccine. One dose is recommended after age 8. Pneumococcal polysaccharide (PPSV23) vaccine. One dose is  recommended after age 35. Talk to your health care provider about which screenings and vaccines  you need and how often you need them. This information is not intended to replace advice given to you by your health care provider. Make sure you discuss any questions you have with your health care provider. Document Released: 01/01/2016 Document Revised: 08/24/2016 Document Reviewed: 10/06/2015 Elsevier Interactive Patient Education  2017 [place] Prevention in the Home Falls can cause injuries. They can happen to people of all ages. There are many things you can do to make your home safe and to help prevent falls. What can I do on the outside of my home? Regularly fix the edges of walkways and driveways and fix any cracks. Remove anything that might make you trip as you walk through a door, such as a raised step or threshold. Trim any bushes or trees on the path to your home. Use bright outdoor lighting. Clear any walking paths of anything that might make someone trip, such as rocks or tools. Regularly check to see if handrails are loose or broken. Make sure that both sides of any steps have handrails. Any raised decks and porches should have guardrails on the edges. Have any leaves, snow, or ice cleared regularly. Use sand or salt on walking paths during winter. Clean up any spills in your garage right away. This includes oil or grease spills. What can I do in the bathroom? Use night lights. Install grab bars by the toilet and in the tub and shower. Do not use towel bars as grab bars. Use non-skid mats or decals in the tub or shower. If you need to sit down in the shower, use a plastic, non-slip stool. Keep the floor dry. Clean up any water that spills on the floor as soon as it happens. Remove soap buildup in the tub or shower regularly. Attach bath mats securely with double-sided non-slip rug tape. Do not have throw rugs and other things on the floor that can make you trip. What can I do in the bedroom? Use night lights. Make sure that you have a light by your bed that  is easy to reach. Do not use any sheets or blankets that are too big for your bed. They should not hang down onto the floor. Have a firm chair that has side arms. You can use this for support while you get dressed. Do not have throw rugs and other things on the floor that can make you trip. What can I do in the kitchen? Clean up any spills right away. Avoid walking on wet floors. Keep items that you use a lot in easy-to-reach places. If you need to reach something above you, use a strong step stool that has a grab bar. Keep electrical cords out of the way. Do not use floor polish or wax that makes floors slippery. If you must use wax, use non-skid floor wax. Do not have throw rugs and other things on the floor that can make you trip. What can I do with my stairs? Do not leave any items on the stairs. Make sure that there are handrails on both sides of the stairs and use them. Fix handrails that are broken or loose. Make sure that handrails are as long as the stairways. Check any carpeting to make sure that it is firmly attached to the stairs. Fix any carpet that is loose or worn. Avoid having throw rugs at the top or bottom of the stairs. If  you do have throw rugs, attach them to the floor with carpet tape. Make sure that you have a light switch at the top of the stairs and the bottom of the stairs. If you do not have them, ask someone to add them for you. What else can I do to help prevent falls? Wear shoes that: Do not have high heels. Have rubber bottoms. Are comfortable and fit you well. Are closed at the toe. Do not wear sandals. If you use a stepladder: Make sure that it is fully opened. Do not climb a closed stepladder. Make sure that both sides of the stepladder are locked into place. Ask someone to hold it for you, if possible. Clearly mark and make sure that you can see: Any grab bars or handrails. First and last steps. Where the edge of each step is. Use tools that help you  move around (mobility aids) if they are needed. These include: Canes. Walkers. Scooters. Crutches. Turn on the lights when you go into a dark area. Replace any light bulbs as soon as they burn out. Set up your furniture so you have a clear path. Avoid moving your furniture around. If any of your floors are uneven, fix them. If there are any pets around you, be aware of where they are. Review your medicines with your doctor. Some medicines can make you feel dizzy. This can increase your chance of falling. Ask your doctor what other things that you can do to help prevent falls. This information is not intended to replace advice given to you by your health care provider. Make sure you discuss any questions you have with your health care provider. Document Released: 10/01/2009 Document Revised: 05/12/2016 Document Reviewed: 01/09/2015 Elsevier Interactive Patient Education  2017 [employer].

## 2021-09-21 NOTE — Progress Notes (Signed)
Subjective:   Lori Edwards is a 87 y.o. female who presents for Medicare Annual (Subsequent) preventive examination.  Review of Systems           Objective:    Today's Vitals   09/21/21 0934  BP: 130/80  Pulse: 64  Temp: 98 F (36.7 C)  SpO2: 98%  Weight: 129 lb (58.5 kg)  Height: 4\' 11"  (1.499 m)   Body mass index is 26.05 kg/m.  Advanced Directives 03/25/2021 09/24/2020 09/15/2020 05/07/2020 09/13/2019 07/12/2018 07/31/2017  Does Patient Have a Medical Advance Directive? Yes Yes Yes Yes Yes Yes No  Type of Advance Directive Out of facility DNR (pink MOST or yellow form) Healthcare Power of [place];Living will Healthcare Power of [place];Living will Healthcare Power of [place];Living will Healthcare Power of [place];Living will Healthcare Power of [place];Living will -  Does patient want to make changes to medical advance directive? No - Patient declined No - Patient declined No - Patient declined No - Patient declined No - Patient declined No - Patient declined -  Copy of Healthcare Power of Attorney in Chart? - Yes - validated most recent copy scanned in chart (See row information) Yes - validated most recent copy scanned in chart (See row information) Yes - validated most recent copy scanned in chart (See row information) Yes - validated most recent copy scanned in chart (See row information) No - copy requested -  Would patient like information on creating a medical advance directive? - - - - - - No - Patient declined  Pre-existing out of facility DNR order (yellow form or pink MOST form) Pink MOST form placed in chart (order not valid for inpatient use) - - - - - -    Current Medications (verified) Outpatient Encounter Medications as of 09/21/2021  Medication Sig   alendronate (FOSAMAX) 70 MG tablet TAKE 1 TABLET BY MOUTH EVERY 7 (SEVEN) DAYS. TAKE WITH A FULL GLASS OF WATER ON AN EMPTY STOMACH.   amLODipine (NORVASC) 5 MG tablet Take 1 tablet (5 mg total) by mouth  daily.   cholecalciferol (VITAMIN D3) 25 MCG (1000 UT) tablet Take 1,000 Units by mouth daily.   co-enzyme Q-10 30 MG capsule Take 30 mg by mouth 3 (three) times daily.   Multiple Vitamin (MULTIVITAMIN WITH MINERALS) TABS tablet Take 1 tablet by mouth daily.   Red Yeast Rice 600 MG CAPS Take 1 capsule by mouth 2 (two) times daily.   No facility-administered encounter medications on file as of 09/21/2021.    Allergies (verified) Patient has no known allergies.   History: Past Medical History:  Diagnosis Date   Hyperlipidemia    Hypertension    Past Surgical History:  Procedure Laterality Date   OPEN REDUCTION INTERNAL FIXATION (ORIF) DISTAL RADIAL FRACTURE Right 07/31/2017   Procedure: OPEN REDUCTION INTERNAL FIXATION (ORIF) DISTAL RADIAL FRACTURE;  Surgeon: 08/02/2017, MD;  Location: [facility] ORS;  Service: Orthopedics;  Laterality: Right;   Family History  Problem Relation Age of Onset   Stroke Other        Parent   Stroke Mother    Hypertension Mother    Hypertension Father    Social History   Socioeconomic History   Marital status: Widowed    Spouse name: Not on file   Number of children: Not on file   Years of education: Not on file   Highest education level: Not on file  Occupational History   Not on file  Tobacco Use   Smoking status:  Never   Smokeless tobacco: Never  Vaping Use   Vaping Use: Never used  Substance and Sexual Activity   Alcohol use: No   Drug use: No   Sexual activity: Not on file  Other Topics Concern   Not on file  Social History Narrative   Not on file   Social Determinants of Health   Financial Resource Strain: Not on file  Food Insecurity: Not on file  Transportation Needs: Not on file  Physical Activity: Not on file  Stress: Not on file  Social Connections: Not on file    Tobacco Counseling Counseling given: Not Answered   Clinical Intake:                 Diabetic?no         Activities of Daily  Living No flowsheet data found.  Patient Care Team: Sharon Seller, NP as PCP - General (Geriatric Medicine)  Indicate any recent Medical Services you may have received from other than [facility] providers in the past year (date may be approximate).     Assessment:   This is a routine wellness examination for [place].  Hearing/Vision screen Hearing Screening - Comments:: Patient has no hearing problems  Dietary issues and exercise activities discussed:     Goals Addressed   None    Depression Screen PHQ 2/9 Scores 09/21/2021 09/15/2020 05/07/2020 01/24/2020 09/13/2019 07/12/2018 02/22/2017  PHQ - 2 Score 0 0 0 0 0 0 0    Fall Risk Fall Risk  09/15/2020 05/07/2020 01/24/2020 09/13/2019 09/13/2019  Falls in the past year? 0 0 0 0 0  Number falls in past yr: 0 0 0 0 -  Injury with Fall? 0 0 - - -  Follow up - - Falls evaluation completed Falls evaluation completed -    FALL RISK PREVENTION PERTAINING TO THE HOME:  Any stairs in or around the home? No  If so, are there any without handrails? No  Home free of loose throw rugs in walkways, pet beds, electrical cords, etc? Yes  Adequate lighting in your home to reduce risk of falls? Yes   ASSISTIVE DEVICES UTILIZED TO PREVENT FALLS:  Life alert? No  Use of a cane, walker or w/c? No  Grab bars in the bathroom? Yes  Shower chair or bench in shower? Yes  Elevated toilet seat or a handicapped toilet? No   TIMED UP AND GO:  Was the test performed? No .   Gait slow and steady without use of assistive device  Cognitive Function: MMSE - Mini Mental State Exam 09/15/2020 07/12/2018  Orientation to time 5 5  Orientation to Place 5 5  Registration 3 3  Attention/ Calculation 5 5  Recall 3 3  Language- name 2 objects 2 2  Language- repeat 1 1  Language- follow 3 step command 3 3  Language- read & follow direction 1 1  Write a sentence 1 1  Copy design 1 1  Total score 30 30     6CIT Screen 09/13/2019  What Year? 0 points  What  month? 0 points  What time? 0 points  Count back from 20 0 points  Months in reverse 0 points  Repeat phrase 0 points  Total Score 0    Immunizations Immunization History  Administered Date(s) Administered   Fluad Quad(high Dose 65+) 08/29/2019   Influenza, High Dose Seasonal PF 08/25/2016, 09/07/2017, 09/10/2018, 09/12/2020   Moderna Sars-Covid-2 Vaccination 12/31/2019, 01/28/2020, 05/06/2021   Pfizer Covid-19 Theatre manager  53yrs & up 09/09/2021   Pneumococcal Conjugate-13 08/03/2017   Pneumococcal Polysaccharide-23 09/18/2006   Tdap 09/10/2018   Zoster Recombinat (Shingrix) 09/13/2019, 03/07/2020    TDAP status: Up to date  Flu Vaccine status: Due, Education has been provided regarding the importance of this vaccine. Advised may receive this vaccine at local pharmacy or Health Dept. Aware to provide a copy of the vaccination record if obtained from local pharmacy or Health Dept. Verbalized acceptance and understanding.  Pneumococcal vaccine status: Up to date  Covid-19 vaccine status: Completed vaccines  Qualifies for Shingles Vaccine? Yes   Zostavax completed Yes   Shingrix Completed?: Yes  Screening Tests Health Maintenance  Topic Date Due   INFLUENZA VACCINE  07/19/2021   COVID-19 Vaccine (4 - Booster for Moderna series) 09/06/2021   TETANUS/TDAP  09/10/2028   DEXA SCAN  Completed   Zoster Vaccines- Shingrix  Completed   HPV VACCINES  Aged Out    Health Maintenance  Health Maintenance Due  Topic Date Due   INFLUENZA VACCINE  07/19/2021   COVID-19 Vaccine (4 - Booster for Moderna series) 09/06/2021    Colorectal cancer screening: No longer required.   Mammogram status: No longer required due to age out.  Bone Density status: Completed 2020. Results reflect: Bone density results: OSTEOPOROSIS. Repeat every 2 years.  Lung Cancer Screening: (Low Dose CT Chest recommended if Age 67-80 years, 30 pack-year currently smoking OR have quit w/in  15years.) does not qualify.   Lung Cancer Screening Referral: na  Additional Screening:  Hepatitis C Screening: does not qualify  Vision Screening: Recommended annual ophthalmology exams for early detection of glaucoma and other disorders of the eye. Is the patient up to date with their annual eye exam?  Yes  Who is the provider or what is the name of the office in which the patient attends annual eye exams? [facility] If pt is not established with a provider, would they like to be referred to a provider to establish care? Yes .   Dental Screening: Recommended annual dental exams for proper oral hygiene  Community Resource Referral / Chronic Care Management: CRR required this visit?  No   CCM required this visit?  No      Plan:     I have personally reviewed and noted the following in the patient's chart:   Medical and social history Use of alcohol, tobacco or illicit drugs  Current medications and supplements including opioid prescriptions.  Functional ability and status Nutritional status Physical activity Advanced directives List of other physicians Hospitalizations, surgeries, and ER visits in previous 12 months Vitals Screenings to include cognitive, depression, and falls Referrals and appointments  In addition, I have reviewed and discussed with patient certain preventive protocols, quality metrics, and best practice recommendations. A written personalized care plan for preventive services as well as general preventive health recommendations were provided to patient.     Sharon Seller, NP   09/21/2021

## 2021-09-23 ENCOUNTER — Other Ambulatory Visit: Payer: Self-pay

## 2021-09-23 ENCOUNTER — Ambulatory Visit: Payer: Medicare Other | Admitting: Nurse Practitioner

## 2021-09-23 ENCOUNTER — Encounter: Payer: Self-pay | Admitting: Nurse Practitioner

## 2021-09-23 VITALS — BP 130/80 | HR 82 | Temp 97.9°F | Ht 59.0 in | Wt 129.0 lb

## 2021-09-23 DIAGNOSIS — N1831 Chronic kidney disease, stage 3a: Secondary | ICD-10-CM | POA: Diagnosis not present

## 2021-09-23 DIAGNOSIS — I10 Essential (primary) hypertension: Secondary | ICD-10-CM | POA: Diagnosis not present

## 2021-09-23 DIAGNOSIS — E785 Hyperlipidemia, unspecified: Secondary | ICD-10-CM

## 2021-09-23 DIAGNOSIS — M810 Age-related osteoporosis without current pathological fracture: Secondary | ICD-10-CM | POA: Diagnosis not present

## 2021-09-23 MED ORDER — AMLODIPINE BESYLATE 5 MG PO TABS: 5.0000 mg | ORAL_TABLET | Freq: Every day | ORAL | 3 refills | Status: DC

## 2021-09-23 NOTE — Progress Notes (Signed)
Careteam: Patient Care Team: Sharon Seller, NP as PCP - General (Geriatric Medicine)  Advanced Directive information Does Patient Have a Medical Advance Directive?: Yes, Type of Advance Directive: Living will, Does patient want to make changes to medical advance directive?: No - Patient declined  No Known Allergies  Chief Complaint  Patient presents with   Medical Management of Chronic Issues    6 month follow up.   Health Maintenance    Flu vaccine     HPI: Patient is a 87 y.o. female seen in today at the [facility] for routine follow up.  She continues to dance regularly. Part of a line dancing group that meets several times a week. She remains very active.   Bone density is due- continues on vit d with fosamax for osteoporosis.   Weight has been stable.   Blood pressure controlled on norvasc 5 mg daily.   Planning to get covid vaccine from [facility]  Review of Systems:  Review of Systems  Constitutional:  Negative for chills, fever and weight loss.  HENT:  Negative for tinnitus.   Respiratory:  Negative for cough, sputum production and shortness of breath.   Cardiovascular:  Negative for chest pain, palpitations and leg swelling.  Gastrointestinal:  Negative for abdominal pain, constipation, diarrhea and heartburn.  Genitourinary:  Negative for dysuria, frequency and urgency.  Musculoskeletal:  Negative for back pain, falls, joint pain and myalgias.  Skin: Negative.   Neurological:  Negative for dizziness and headaches.  Psychiatric/Behavioral:  Negative for depression and memory loss. The patient does not have insomnia.    Past Medical History:  Diagnosis Date   Hyperlipidemia    Hypertension    Past Surgical History:  Procedure Laterality Date   OPEN REDUCTION INTERNAL FIXATION (ORIF) DISTAL RADIAL FRACTURE Right 07/31/2017   Procedure: OPEN REDUCTION INTERNAL FIXATION (ORIF) DISTAL RADIAL FRACTURE;  Surgeon: Deeann Saint, MD;  Location: [facility]  [facility];  Service: Orthopedics;  Laterality: Right;   Social History:   reports that she has never smoked. She has never used smokeless tobacco. She reports that she does not drink alcohol and does not use drugs.  Family History  Problem Relation Age of Onset   Stroke Other        Parent   Stroke Mother    Hypertension Mother    Hypertension Father     Medications: Patient's Medications  New Prescriptions   No medications on file  Previous Medications   ALENDRONATE (FOSAMAX) 70 MG TABLET    TAKE 1 TABLET BY MOUTH EVERY 7 (SEVEN) DAYS. TAKE WITH A FULL GLASS OF WATER ON AN EMPTY STOMACH.   AMLODIPINE (NORVASC) 5 MG TABLET    Take 1 tablet (5 mg total) by mouth daily.   CHOLECALCIFEROL (VITAMIN D3) 25 MCG (1000 UT) TABLET    Take 1,000 Units by mouth daily.   CO-ENZYME Q-10 30 MG CAPSULE    Take 30 mg by mouth 3 (three) times daily.   MULTIPLE VITAMIN (MULTIVITAMIN WITH MINERALS) TABS TABLET    Take 1 tablet by mouth daily.   RED YEAST RICE 600 MG CAPS    Take 1 capsule by mouth 2 (two) times daily.  Modified Medications   No medications on file  Discontinued Medications   No medications on file    Physical Exam:  Vitals:   09/23/21 0856  BP: 130/80  Pulse: 82  Temp: 97.9 F (36.6 C)  SpO2: 98%  Weight: 129 lb (58.5 kg)  Height: 4\' 11"  (1.499 m)   Body mass index is 26.05 kg/m. Wt Readings from Last 3 Encounters:  09/23/21 129 lb (58.5 kg)  09/21/21 129 lb (58.5 kg)  03/25/21 124 lb 8 oz (56.5 kg)    Physical Exam Constitutional:      General: She is not in acute distress.    Appearance: She is well-developed. She is not diaphoretic.  HENT:     Head: Normocephalic and atraumatic.     Mouth/Throat:     Pharynx: No oropharyngeal exudate.  Eyes:     Conjunctiva/sclera: Conjunctivae normal.     Pupils: Pupils are equal, round, and reactive to light.  Cardiovascular:     Rate and Rhythm: Normal rate and regular rhythm.     Heart sounds: Normal heart sounds.   Pulmonary:     Effort: Pulmonary effort is normal.     Breath sounds: Normal breath sounds.  Abdominal:     General: Bowel sounds are normal.     Palpations: Abdomen is soft.  Musculoskeletal:     Cervical back: Normal range of motion and neck supple.     Right lower leg: No edema.     Left lower leg: No edema.  Skin:    General: Skin is warm and dry.  Neurological:     Mental Status: She is alert.  Psychiatric:        Mood and Affect: Mood normal.    Labs reviewed: Basic Metabolic Panel: Recent Labs    03/19/21 0000 09/16/21 0000  NA 136* 139  K 3.9 4.0  CL 106 105  CO2 27* 27*  BUN 20 15  CREATININE 1.0 0.9  CALCIUM 9.7 9.6   Liver Function Tests: Recent Labs    09/16/21 0000  AST 21  ALT 17  ALKPHOS 48  ALBUMIN 4.3   No results for input(s): LIPASE, AMYLASE in the last 8760 hours. No results for input(s): AMMONIA in the last 8760 hours. CBC: Recent Labs    09/16/21 0000  WBC 8.4  HGB 13.8  HCT 40  PLT 296   Lipid Panel: No results for input(s): CHOL, HDL, LDLCALC, TRIG, CHOLHDL, LDLDIRECT in the last 8760 hours. TSH: No results for input(s): TSH in the last 8760 hours. A1C: Lab Results  Component Value Date   HGBA1C 5.7 03/19/2021     Assessment/Plan 1. Age-related osteoporosis without current pathological fracture -Recommended to take calcium 600 mg twice daily with Vitamin D 2000 units daily and weight bearing activity 30 mins/5 days a week and continues on fosamax weekly.  -will follow up dexa scan.  2. Essential hypertension Well controlled on diet and medication.  - amLODipine (NORVASC) 5 MG tablet; Take 1 tablet (5 mg total) by mouth daily.  Dispense: 90 tablet; Refill: 3  3. Chronic kidney disease, stage 3a (HCC) Chronic and stable Encourage proper hydration Follow metabolic panel Avoid nephrotoxic meds (NSAIDS)  4. Hyperlipidemia, unspecified hyperlipidemia type -continues on fish oil. Will update lipids with next labs.     Next appt: yearly, labs prior Lori Edwards K. 02-16-1976  [facility] [phone]

## 2021-09-30 DIAGNOSIS — Z23 Encounter for immunization: Secondary | ICD-10-CM | POA: Diagnosis not present

## 2021-09-30 DIAGNOSIS — H2513 Age-related nuclear cataract, bilateral: Secondary | ICD-10-CM | POA: Diagnosis not present

## 2022-03-11 ENCOUNTER — Other Ambulatory Visit: Payer: Self-pay | Admitting: Nurse Practitioner

## 2022-03-11 DIAGNOSIS — M810 Age-related osteoporosis without current pathological fracture: Secondary | ICD-10-CM

## 2022-05-17 DIAGNOSIS — Z23 Encounter for immunization: Secondary | ICD-10-CM | POA: Diagnosis not present

## 2022-09-12 ENCOUNTER — Other Ambulatory Visit: Payer: Self-pay | Admitting: Nurse Practitioner

## 2022-09-12 DIAGNOSIS — I10 Essential (primary) hypertension: Secondary | ICD-10-CM

## 2022-09-21 NOTE — Progress Notes (Unsigned)
   This service is provided via telemedicine  No vital signs collected/recorded due to the encounter was a telemedicine visit.   Location of patient (ex: home, work):  Home  Patient consents to a telephone visit: Yes, see telephone visit dated 09/22/2022  Location of the provider (ex: office, home):  [place], [place]   Name of any referring provider:  N/A  Names of all persons participating in the telemedicine service and their role in the encounter:  S.Chrae B/CMA, Sherrie Mustache, NP, and Patient   Time spent on call:  9 min with medical assistant

## 2022-09-22 ENCOUNTER — Telehealth: Payer: Self-pay

## 2022-09-22 ENCOUNTER — Encounter: Payer: Self-pay | Admitting: Nurse Practitioner

## 2022-09-22 ENCOUNTER — Ambulatory Visit (INDEPENDENT_AMBULATORY_CARE_PROVIDER_SITE_OTHER): Payer: Medicare Other | Admitting: Nurse Practitioner

## 2022-09-22 DIAGNOSIS — E785 Hyperlipidemia, unspecified: Secondary | ICD-10-CM | POA: Diagnosis not present

## 2022-09-22 DIAGNOSIS — Z0000 Encounter for general adult medical examination without abnormal findings: Secondary | ICD-10-CM | POA: Diagnosis not present

## 2022-09-22 DIAGNOSIS — E2839 Other primary ovarian failure: Secondary | ICD-10-CM

## 2022-09-22 DIAGNOSIS — N1831 Chronic kidney disease, stage 3a: Secondary | ICD-10-CM | POA: Diagnosis not present

## 2022-09-22 LAB — CBC AND DIFFERENTIAL
HCT: 41 (ref 36–46)
Hemoglobin: 13.8 (ref 12.0–16.0)
Neutrophils Absolute: 3102
Platelets: 270 10*3/uL (ref 150–400)
WBC: 6.6

## 2022-09-22 LAB — BASIC METABOLIC PANEL
BUN: 18 (ref 4–21)
CO2: 28 — AB (ref 13–22)
Chloride: 106 (ref 99–108)
Glucose: 82
Potassium: 4 mEq/L (ref 3.5–5.1)
Sodium: 140 (ref 137–147)

## 2022-09-22 LAB — LIPID PANEL
Cholesterol: 210 — AB (ref 0–200)
HDL: 47 (ref 35–70)
LDL Cholesterol: 135
Triglycerides: 146 (ref 40–160)

## 2022-09-22 LAB — COMPREHENSIVE METABOLIC PANEL
Albumin: 4.2 (ref 3.5–5.0)
Calcium: 9.2 (ref 8.7–10.7)
Globulin: 3.2
eGFR: 59

## 2022-09-22 LAB — CBC: RBC: 4.43 (ref 3.87–5.11)

## 2022-09-22 LAB — HEPATIC FUNCTION PANEL
ALT: 13 U/L (ref 7–35)
AST: 21 (ref 13–35)
Alkaline Phosphatase: 56 (ref 25–125)
Bilirubin, Total: 0.4

## 2022-09-22 NOTE — Telephone Encounter (Signed)
[name], grein are scheduled for a virtual visit with your provider today.    Just as we do with appointments in the office, we must obtain your consent to participate.  Your consent will be active for this visit and any virtual visit you may have with one of our providers in the next 365 days.    If you have a MyChart account, I can also send a copy of this consent to you electronically.  All virtual visits are billed to your insurance company just like a traditional visit in the office.  As this is a virtual visit, video technology does not allow for your provider to perform a traditional examination.  This may limit your provider's ability to fully assess your condition.  If your provider identifies any concerns that need to be evaluated in person or the need to arrange testing such as labs, EKG, etc, we will make arrangements to do so.    Although advances in technology are sophisticated, we cannot ensure that it will always work on either your end or our end.  If the connection with a video visit is poor, we may have to switch to a telephone visit.  With either a video or telephone visit, we are not always able to ensure that we have a secure connection.   I need to obtain your verbal consent now.   Are you willing to proceed with your visit today?   Lori Edwards has provided verbal consent on 09/22/2022 for a virtual visit (video or telephone).   [name], [place] 09/22/2022  9:45 AM

## 2022-09-22 NOTE — Patient Instructions (Signed)
Lori Edwards , Thank you for taking time to come for your Medicare Wellness Visit. I appreciate your ongoing commitment to your health goals. Please review the following plan we discussed and let me know if I can assist you in the future.   Screening recommendations/referrals: Colonoscopy aged out Mammogram aged out Bone Density order placed  Recommended yearly ophthalmology/optometry visit for glaucoma screening and checkup Recommended yearly dental visit for hygiene and checkup  Vaccinations: Influenza vaccine- due annually in September/October Pneumococcal vaccine up to date Tdap vaccine up to date Shingles vaccine up to date    Advanced directives: on file.   Conditions/risks identified: advance age   Next appointment: yearly- next in clinic at [facility]    Preventive Care 86 Years and Older, Female Preventive care refers to lifestyle choices and visits with your health care provider that can promote health and wellness. What does preventive care include? A yearly physical exam. This is also called an annual well check. Dental exams once or twice a year. Routine eye exams. Ask your health care provider how often you should have your eyes checked. Personal lifestyle choices, including: Daily care of your teeth and gums. Regular physical activity. Eating a healthy diet. Avoiding tobacco and drug use. Limiting alcohol use. Practicing safe sex. Taking low-dose aspirin every day. Taking vitamin and mineral supplements as recommended by your health care provider. What happens during an annual well check? The services and screenings done by your health care provider during your annual well check will depend on your age, overall health, lifestyle risk factors, and family history of disease. Counseling  Your health care provider may ask you questions about your: Alcohol use. Tobacco use. Drug use. Emotional well-being. Home and relationship well-being. Sexual  activity. Eating habits. History of falls. Memory and ability to understand (cognition). Work and work Statistician. Reproductive health. Screening  You may have the following tests or measurements: Height, weight, and BMI. Blood pressure. Lipid and cholesterol levels. These may be checked every 5 years, or more frequently if you are over 86 years old. Skin check. Lung cancer screening. You may have this screening every year starting at age 86 if you have a 30-pack-year history of smoking and currently smoke or have quit within the past 15 years. Fecal occult blood test (FOBT) of the stool. You may have this test every year starting at age 86. Flexible sigmoidoscopy or colonoscopy. You may have a sigmoidoscopy every 5 years or a colonoscopy every 10 years starting at age 65. Hepatitis C blood test. Hepatitis B blood test. Sexually transmitted disease (STD) testing. Diabetes screening. This is done by checking your blood sugar (glucose) after you have not eaten for a while (fasting). You may have this done every 1-3 years. Bone density scan. This is done to screen for osteoporosis. You may have this done starting at age 18. Mammogram. This may be done every 1-2 years. Talk to your health care provider about how often you should have regular mammograms. Talk with your health care provider about your test results, treatment options, and if necessary, the need for more tests. Vaccines  Your health care provider may recommend certain vaccines, such as: Influenza vaccine. This is recommended every year. Tetanus, diphtheria, and acellular pertussis (Tdap, Td) vaccine. You may need a Td booster every 10 years. Zoster vaccine. You may need this after age 86. Pneumococcal 13-valent conjugate (PCV13) vaccine. One dose is recommended after age 86. Pneumococcal polysaccharide (PPSV23) vaccine. One dose is recommended after age  86. Talk to your health care provider about which screenings and vaccines  you need and how often you need them. This information is not intended to replace advice given to you by your health care provider. Make sure you discuss any questions you have with your health care provider. Document Released: 01/01/2016 Document Revised: 08/24/2016 Document Reviewed: 10/06/2015 Elsevier Interactive Patient Education  2017 [employer] Prevention in the Home Falls can cause injuries. They can happen to people of all ages. There are many things you can do to make your home safe and to help prevent falls. What can I do on the outside of my home? Regularly fix the edges of walkways and driveways and fix any cracks. Remove anything that might make you trip as you walk through a door, such as a raised step or threshold. Trim any bushes or trees on the path to your home. Use bright outdoor lighting. Clear any walking paths of anything that might make someone trip, such as rocks or tools. Regularly check to see if handrails are loose or broken. Make sure that both sides of any steps have handrails. Any raised decks and porches should have guardrails on the edges. Have any leaves, snow, or ice cleared regularly. Use sand or salt on walking paths during winter. Clean up any spills in your garage right away. This includes oil or grease spills. What can I do in the bathroom? Use night lights. Install grab bars by the toilet and in the tub and shower. Do not use towel bars as grab bars. Use non-skid mats or decals in the tub or shower. If you need to sit down in the shower, use a plastic, non-slip stool. Keep the floor dry. Clean up any water that spills on the floor as soon as it happens. Remove soap buildup in the tub or shower regularly. Attach bath mats securely with double-sided non-slip rug tape. Do not have throw rugs and other things on the floor that can make you trip. What can I do in the bedroom? Use night lights. Make sure that you have a light by your bed that  is easy to reach. Do not use any sheets or blankets that are too big for your bed. They should not hang down onto the floor. Have a firm chair that has side arms. You can use this for support while you get dressed. Do not have throw rugs and other things on the floor that can make you trip. What can I do in the kitchen? Clean up any spills right away. Avoid walking on wet floors. Keep items that you use a lot in easy-to-reach places. If you need to reach something above you, use a strong step stool that has a grab bar. Keep electrical cords out of the way. Do not use floor polish or wax that makes floors slippery. If you must use wax, use non-skid floor wax. Do not have throw rugs and other things on the floor that can make you trip. What can I do with my stairs? Do not leave any items on the stairs. Make sure that there are handrails on both sides of the stairs and use them. Fix handrails that are broken or loose. Make sure that handrails are as long as the stairways. Check any carpeting to make sure that it is firmly attached to the stairs. Fix any carpet that is loose or worn. Avoid having throw rugs at the top or bottom of the stairs. If you do have  throw rugs, attach them to the floor with carpet tape. Make sure that you have a light switch at the top of the stairs and the bottom of the stairs. If you do not have them, ask someone to add them for you. What else can I do to help prevent falls? Wear shoes that: Do not have high heels. Have rubber bottoms. Are comfortable and fit you well. Are closed at the toe. Do not wear sandals. If you use a stepladder: Make sure that it is fully opened. Do not climb a closed stepladder. Make sure that both sides of the stepladder are locked into place. Ask someone to hold it for you, if possible. Clearly mark and make sure that you can see: Any grab bars or handrails. First and last steps. Where the edge of each step is. Use tools that help you  move around (mobility aids) if they are needed. These include: Canes. Walkers. Scooters. Crutches. Turn on the lights when you go into a dark area. Replace any light bulbs as soon as they burn out. Set up your furniture so you have a clear path. Avoid moving your furniture around. If any of your floors are uneven, fix them. If there are any pets around you, be aware of where they are. Review your medicines with your doctor. Some medicines can make you feel dizzy. This can increase your chance of falling. Ask your doctor what other things that you can do to help prevent falls. This information is not intended to replace advice given to you by your health care provider. Make sure you discuss any questions you have with your health care provider. Document Released: 10/01/2009 Document Revised: 05/12/2016 Document Reviewed: 01/09/2015 Elsevier Interactive Patient Education  2017 [employer].

## 2022-09-22 NOTE — Progress Notes (Signed)
Subjective:   Lori Edwards is a 88 y.o. female who presents for Medicare Annual (Subsequent) preventive examination.  Review of Systems     Cardiac Risk Factors include: advanced age (>61men, >31 women);dyslipidemia;hypertension     Objective:    Today's Vitals   09/22/22 0906  PainSc: 4    There is no height or weight on file to calculate BMI.     09/22/2022    8:45 AM 09/21/2022    4:48 PM 09/23/2021    8:55 AM 09/21/2021    9:41 AM 03/25/2021    9:00 AM 09/24/2020    9:06 AM 09/15/2020    9:00 AM  Advanced Directives  Does Patient Have a Medical Advance Directive? Yes Yes Yes Yes Yes Yes Yes  Type of Estate agent of [place];Living will;Out of facility DNR (pink MOST or yellow form) Healthcare Power of [place];Living will;Out of facility DNR (pink MOST or yellow form) Living will Living will Out of facility DNR (pink MOST or yellow form) Healthcare Power of [place];Living will Healthcare Power of [place];Living will  Does patient want to make changes to medical advance directive? No - Patient declined No - Patient declined No - Patient declined No - Patient declined No - Patient declined No - Patient declined No - Patient declined  Copy of Healthcare Power of Attorney in Chart? Yes - validated most recent copy scanned in chart (See row information) Yes - validated most recent copy scanned in chart (See row information)    Yes - validated most recent copy scanned in chart (See row information) Yes - validated most recent copy scanned in chart (See row information)  Pre-existing out of facility DNR order (yellow form or pink MOST form) Pink MOST form placed in chart (order not valid for inpatient use) Pink MOST form placed in chart (order not valid for inpatient use)   Pink MOST form placed in chart (order not valid for inpatient use)      Current Medications (verified) Outpatient Encounter Medications as of 09/22/2022  Medication Sig   alendronate  (FOSAMAX) 70 MG tablet TAKE 1 TABLET BY MOUTH EVERY 7 (SEVEN) DAYS. TAKE WITH A FULL GLASS OF WATER ON AN EMPTY STOMACH.   amLODipine (NORVASC) 5 MG tablet TAKE 1 TABLET (5 MG TOTAL) BY MOUTH DAILY.   cholecalciferol (VITAMIN D3) 25 MCG (1000 UT) tablet Take 1,000 Units by mouth daily.   Multiple Vitamin (MULTIVITAMIN WITH MINERALS) TABS tablet Take 1 tablet by mouth daily.   Red Yeast Rice 600 MG CAPS Take 1 capsule by mouth 2 (two) times daily.   [DISCONTINUED] co-enzyme Q-10 30 MG capsule Take 30 mg by mouth 3 (three) times daily.   No facility-administered encounter medications on file as of 09/22/2022.    Allergies (verified) Patient has no known allergies.   History: Past Medical History:  Diagnosis Date   Hyperlipidemia    Hypertension    Past Surgical History:  Procedure Laterality Date   OPEN REDUCTION INTERNAL FIXATION (ORIF) DISTAL RADIAL FRACTURE Right 07/31/2017   Procedure: OPEN REDUCTION INTERNAL FIXATION (ORIF) DISTAL RADIAL FRACTURE;  Surgeon: Deeann Saint, MD;  Location: [facility] ORS;  Service: Orthopedics;  Laterality: Right;   Family History  Problem Relation Age of Onset   Stroke Other        Parent   Stroke Mother    Hypertension Mother    Hypertension Father    Social History   Socioeconomic History   Marital status: Widowed  Spouse name: Not on file   Number of children: Not on file   Years of education: Not on file   Highest education level: Not on file  Occupational History   Not on file  Tobacco Use   Smoking status: Never   Smokeless tobacco: Never  Vaping Use   Vaping Use: Never used  Substance and Sexual Activity   Alcohol use: No   Drug use: No   Sexual activity: Not on file  Other Topics Concern   Not on file  Social History Narrative   Not on file   Social Determinants of Health   Financial Resource Strain: Low Risk  (07/12/2018)   Overall Financial Resource Strain (CARDIA)    Difficulty of Paying Living Expenses: Not hard at  all  Food Insecurity: No Food Insecurity (07/12/2018)   Hunger Vital Sign    Worried About Running Out of Food in the Last Year: Never true    Ran Out of Food in the Last Year: Never true  Transportation Needs: No Transportation Needs (07/12/2018)   PRAPARE - Administrator, Civil Service (Medical): No    Lack of Transportation (Non-Medical): No  Physical Activity: Unknown (09/13/2019)   Exercise Vital Sign    Days of Exercise per Week: Not on file    Minutes of Exercise per Session: 30 min  Stress: No Stress Concern Present (07/12/2018)   [employer] of Occupational Health - Occupational Stress Questionnaire    Feeling of Stress : Not at all  Social Connections: Not on file    Tobacco Counseling Counseling given: Not Answered   Clinical Intake:  Pre-visit preparation completed: Yes  Pain : 0-10 Pain Score: 4  Pain Type: Acute pain Pain Location: Knee Pain Orientation: Left Pain Descriptors / Indicators: Aching Pain Onset: 1 to 4 weeks ago Pain Frequency: Constant Pain Relieving Factors: tylenol helping  Pain Relieving Factors: tylenol helping  BMI - recorded: 26 Nutritional Risks: None Diabetes: No  How often do you need to have someone help you when you read instructions, pamphlets, or other written materials from your doctor or pharmacy?: 1 - Never  Diabetic?no         Activities of Daily Living    09/22/2022    9:02 AM  In your present state of health, do you have any difficulty performing the following activities:  Hearing? 0  Vision? 0  Difficulty concentrating or making decisions? 0  Walking or climbing stairs? 0  Dressing or bathing? 0  Doing errands, shopping? 0  Preparing Food and eating ? N  Using the Toilet? N  In the past six months, have you accidently leaked urine? N  Do you have problems with loss of bowel control? N  Managing your Medications? N  Managing your Finances? N  Housekeeping or managing your Housekeeping? N     Patient Care Team: Sharon Seller, NP as PCP - General (Geriatric Medicine)  Indicate any recent Medical Services you may have received from other than [facility] providers in the past year (date may be approximate).     Assessment:   This is a routine wellness examination for [name].  Hearing/Vision screen Hearing Screening - Comments:: No hearing issues  Vision Screening - Comments:: /Pending eye exam on 10/06/2022 with Dr.King with [facility]  Dietary issues and exercise activities discussed: Current Exercise Habits: Structured exercise class, Type of exercise: yoga;Other - see comments (cardiodance), Frequency (Times/Week): 5   Goals Addressed   None  Depression Screen    09/22/2022    8:42 AM 09/21/2021    9:39 AM 09/15/2020    8:58 AM 05/07/2020    1:28 PM 01/24/2020    1:24 PM 09/13/2019   10:11 AM 07/12/2018    9:20 AM  PHQ 2/9 Scores  PHQ - 2 Score 0 0 0 0 0 0 0    Fall Risk    09/22/2022    8:42 AM 09/23/2021    8:55 AM 09/21/2021    9:40 AM 09/15/2020    8:59 AM 05/07/2020    1:28 PM  Fall Risk   Falls in the past year? 0 0 0 0 0  Number falls in past yr: 0 0 0 0 0  Injury with Fall? 0 0 0 0 0  Risk for fall due to : No Fall Risks No Fall Risks No Fall Risks    Follow up Falls evaluation completed Falls evaluation completed Falls evaluation completed      FALL RISK PREVENTION PERTAINING TO THE HOME:  Any stairs in or around the home? No  If so, are there any without handrails?  na Home free of loose throw rugs in walkways, pet beds, electrical cords, etc? Yes  Adequate lighting in your home to reduce risk of falls? Yes   ASSISTIVE DEVICES UTILIZED TO PREVENT FALLS:  Life alert? No  Use of a cane, walker or w/c? No  Grab bars in the bathroom? Yes  Shower chair or bench in shower? Yes  Elevated toilet seat or a handicapped toilet? No   TIMED UP AND GO:  Was the test performed? No .    Cognitive Function:    09/21/2021    9:41 AM 09/15/2020     9:01 AM 07/12/2018    9:42 AM  MMSE - Mini Mental State Exam  Orientation to time 5 5 5   Orientation to Place 5 5 5   Registration 3 3 3   Attention/ Calculation 5 5 5   Recall 3 3 3   Language- name 2 objects 2 2 2   Language- repeat 0 1 1  Language- follow 3 step command 3 3 3   Language- read & follow direction 1 1 1   Write a sentence 1 1 1   Copy design 1 1 1   Total score 29 30 30         09/22/2022    8:45 AM 09/13/2019   10:13 AM  6CIT Screen  What Year? 0 points 0 points  What month? 0 points 0 points  What time? 0 points 0 points  Count back from 20 0 points 0 points  Months in reverse 0 points 0 points  Repeat phrase 0 points 0 points  Total Score 0 points 0 points    Immunizations Immunization History  Administered Date(s) Administered   Fluad Quad(high Dose 65+) 08/29/2019   Influenza, High Dose Seasonal PF 08/25/2016, 09/07/2017, 09/10/2018, 09/12/2020   Moderna Sars-Covid-2 Vaccination 12/31/2019, 01/28/2020, 05/06/2021   PFIZER(Purple Top)SARS-COV-2 Vaccination 09/09/2021   Pneumococcal Conjugate-13 08/03/2017   Pneumococcal Polysaccharide-23 09/18/2006   Tdap 09/10/2018   Zoster Recombinat (Shingrix) 09/13/2019, 03/07/2020    TDAP status: Up to date  Flu Vaccine status: Due, Education has been provided regarding the importance of this vaccine. Advised may receive this vaccine at local pharmacy or Health Dept. Aware to provide a copy of the vaccination record if obtained from local pharmacy or Health Dept. Verbalized acceptance and understanding.  Pneumococcal vaccine status: Up to date  Covid-19 vaccine status: Information provided on  how to obtain vaccines.   Qualifies for Shingles Vaccine? Yes   Zostavax completed Yes   Shingrix Completed?: Yes  Screening Tests Health Maintenance  Topic Date Due   INFLUENZA VACCINE  07/19/2022   TETANUS/TDAP  09/10/2028   Pneumonia Vaccine 67+ Years old  Completed   DEXA SCAN  Completed   Zoster Vaccines- Shingrix   Completed   HPV VACCINES  Aged Out   COVID-19 Vaccine  Discontinued    Health Maintenance  Health Maintenance Due  Topic Date Due   INFLUENZA VACCINE  07/19/2022    Colorectal cancer screening: No longer required.   Mammogram status: No longer required due to age.  Bone Density status: Completed 2020. Results reflect: Bone density results: OSTEOPOROSIS. Repeat every 2 years.  Lung Cancer Screening: (Low Dose CT Chest recommended if Age 72-80 years, 30 pack-year currently smoking OR have quit w/in 15years.) does not qualify.   Lung Cancer Screening Referral: na  Additional Screening:  Hepatitis C Screening: does not qualify  Vision Screening: Recommended annual ophthalmology exams for early detection of glaucoma and other disorders of the eye. Is the patient up to date with their annual eye exam?  Yes  Who is the provider or what is the name of the office in which the patient attends annual eye exams? [facility] If pt is not established with a provider, would they like to be referred to a provider to establish care? No .   Dental Screening: Recommended annual dental exams for proper oral hygiene  Community Resource Referral / Chronic Care Management: CRR required this visit?  No   CCM required this visit?  No      Plan:     I have personally reviewed and noted the following in the patient's chart:   Medical and social history Use of alcohol, tobacco or illicit drugs  Current medications and supplements including opioid prescriptions. Patient is not currently taking opioid prescriptions. Functional ability and status Nutritional status Physical activity Advanced directives List of other physicians Hospitalizations, surgeries, and ER visits in previous 12 months Vitals Screenings to include cognitive, depression, and falls Referrals and appointments  In addition, I have reviewed and discussed with patient certain preventive protocols, quality metrics, and  best practice recommendations. A written personalized care plan for preventive services as well as general preventive health recommendations were provided to patient.     Lauree Chandler, NP   09/22/2022    Virtual Visit via Telephone Note  I connected with patient 09/22/22 at  9:00 AM EDT by telephone and verified that I am speaking with the correct person using two identifiers.  Location: Patient: home Provider: [facility]    I discussed the limitations, risks, security and privacy concerns of performing an evaluation and management service by telephone and the availability of in person appointments. I also discussed with the patient that there may be a patient responsible charge related to this service. The patient expressed understanding and agreed to proceed.   I discussed the assessment and treatment plan with the patient. The patient was provided an opportunity to ask questions and all were answered. The patient agreed with the plan and demonstrated an understanding of the instructions.   The patient was advised to call back or seek an in-person evaluation if the symptoms worsen or if the condition fails to improve as anticipated.  I provided 15 minutes of non-face-to-face time during this encounter.  Carlos American. Harle Battiest Avs printed and mailed

## 2022-09-27 ENCOUNTER — Ambulatory Visit: Payer: Medicare Other | Admitting: Nurse Practitioner

## 2022-09-27 ENCOUNTER — Encounter: Payer: Self-pay | Admitting: Nurse Practitioner

## 2022-09-27 VITALS — BP 122/70 | HR 66 | Temp 97.5°F | Ht 59.0 in | Wt 134.0 lb

## 2022-09-27 DIAGNOSIS — E785 Hyperlipidemia, unspecified: Secondary | ICD-10-CM | POA: Diagnosis not present

## 2022-09-27 DIAGNOSIS — N1831 Chronic kidney disease, stage 3a: Secondary | ICD-10-CM

## 2022-09-27 DIAGNOSIS — I10 Essential (primary) hypertension: Secondary | ICD-10-CM

## 2022-09-27 DIAGNOSIS — M810 Age-related osteoporosis without current pathological fracture: Secondary | ICD-10-CM

## 2022-09-27 DIAGNOSIS — M1712 Unilateral primary osteoarthritis, left knee: Secondary | ICD-10-CM

## 2022-09-27 NOTE — Progress Notes (Signed)
Careteam: Patient Care Team: Lauree Chandler, NP as PCP - General (Geriatric Medicine)  Advanced Directive information Does Patient Have a Medical Advance Directive?: Yes, Type of Advance Directive: [place];Living will;Out of facility DNR (pink MOST or yellow form), Pre-existing out of facility DNR order (yellow form or pink MOST form): Pink MOST form placed in chart (order not valid for inpatient use), Does patient want to make changes to medical advance directive?: No - Patient declined  No Known Allergies  Chief Complaint  Patient presents with   Medical Management of Chronic Issues    Routine visit and discuss recent labs (copy given to patient). Discuss need for flu vaccine or post pone if patient refuses.      HPI: Patient is a 88 y.o. female seen in today at the [facility] for routine follow up.  Her only concern is that she does not want to live to be 89.   Hyperlipidemia- does not want to take any medication, LDL 135  Osteoporosis- continues on fosamax- she dances regularly she takes calcium and vit d Bone density follow up in December She was on fosamax for 20 years and  then stopped for 10 years and now been on 2 years.   Htn- well controlled.   Getting flu shot next Tuesday. She does not want to get have any more COVID vaccine.  Review of Systems:  Review of Systems  Constitutional:  Negative for chills, fever and weight loss.  HENT:  Negative for tinnitus.   Respiratory:  Negative for cough, sputum production and shortness of breath.   Cardiovascular:  Negative for chest pain, palpitations and leg swelling.  Gastrointestinal:  Negative for abdominal pain, constipation, diarrhea and heartburn.  Genitourinary:  Negative for dysuria, frequency and urgency.  Musculoskeletal:  Positive for joint pain (to left knee). Negative for back pain, falls and myalgias.  Skin: Negative.   Neurological:  Negative for dizziness and headaches.   Psychiatric/Behavioral:  Negative for depression and memory loss. The patient does not have insomnia.     Past Medical History:  Diagnosis Date   Hyperlipidemia    Hypertension    Past Surgical History:  Procedure Laterality Date   OPEN REDUCTION INTERNAL FIXATION (ORIF) DISTAL RADIAL FRACTURE Right 07/31/2017   Procedure: OPEN REDUCTION INTERNAL FIXATION (ORIF) DISTAL RADIAL FRACTURE;  Surgeon: Earnestine Leys, MD;  Location: [facility] ORS;  Service: Orthopedics;  Laterality: Right;   Social History:   reports that she has never smoked. She has never used smokeless tobacco. She reports that she does not drink alcohol and does not use drugs.  Family History  Problem Relation Age of Onset   Stroke Other        Parent   Stroke Mother    Hypertension Mother    Hypertension Father     Medications: Patient's Medications  New Prescriptions   No medications on file  Previous Medications   ALENDRONATE (FOSAMAX) 70 MG TABLET    TAKE 1 TABLET BY MOUTH EVERY 7 (SEVEN) DAYS. TAKE WITH A FULL GLASS OF WATER ON AN EMPTY STOMACH.   AMLODIPINE (NORVASC) 5 MG TABLET    TAKE 1 TABLET (5 MG TOTAL) BY MOUTH DAILY.   CHOLECALCIFEROL (VITAMIN D3) 25 MCG (1000 UT) TABLET    Take 1,000 Units by mouth daily.   MULTIPLE VITAMIN (MULTIVITAMIN WITH MINERALS) TABS TABLET    Take 1 tablet by mouth daily.   RED YEAST RICE 600 MG CAPS  Take 1 capsule by mouth 2 (two) times daily.  Modified Medications   No medications on file  Discontinued Medications   No medications on file    Physical Exam:  Vitals:   09/27/22 0900  BP: 122/70  Pulse: 66  Temp: (!) 97.5 F (36.4 C)  TempSrc: Temporal  SpO2: 96%  Weight: 134 lb (60.8 kg)  Height: 4\' 11"  (1.499 m)   Body mass index is 27.06 kg/m. Wt Readings from Last 3 Encounters:  09/27/22 134 lb (60.8 kg)  09/23/21 129 lb (58.5 kg)  09/21/21 129 lb (58.5 kg)    Physical Exam Constitutional:      General: She is not in acute distress.    Appearance:  She is well-developed. She is not diaphoretic.  HENT:     Head: Normocephalic and atraumatic.     Mouth/Throat:     Pharynx: No oropharyngeal exudate.  Eyes:     Conjunctiva/sclera: Conjunctivae normal.     Pupils: Pupils are equal, round, and reactive to light.  Cardiovascular:     Rate and Rhythm: Normal rate and regular rhythm.     Heart sounds: Normal heart sounds.  Pulmonary:     Effort: Pulmonary effort is normal.     Breath sounds: Normal breath sounds.  Abdominal:     General: Bowel sounds are normal.     Palpations: Abdomen is soft.  Musculoskeletal:     Cervical back: Normal range of motion and neck supple.     Right lower leg: No edema.     Left lower leg: No edema.  Skin:    General: Skin is warm and dry.  Neurological:     Mental Status: She is alert.  Psychiatric:        Mood and Affect: Mood normal.    Labs reviewed: Basic Metabolic Panel: Recent Labs    09/22/22 0000  NA 140  K 4.0  CL 106  CO2 28*  BUN 18  CALCIUM 9.2   Liver Function Tests: Recent Labs    09/22/22 0000  AST 21  ALT 13  ALKPHOS 56  ALBUMIN 4.2   No results for input(s): "LIPASE", "AMYLASE" in the last 8760 hours. No results for input(s): "AMMONIA" in the last 8760 hours. CBC: Recent Labs    09/22/22 0000  WBC 6.6  NEUTROABS 3,102.00  HGB 13.8  HCT 41  PLT 270   Lipid Panel: Recent Labs    09/22/22 0000  CHOL 210*  HDL 47  LDLCALC 135  TRIG 11/22/22   TSH: No results for input(s): "TSH" in the last 8760 hours. A1C: Lab Results  Component Value Date   HGBA1C 5.7 03/19/2021     Assessment/Plan 1. Age-related osteoporosis without current pathological fracture -continues on fosamax with cal and vit d and weight bearing exercises -has follow up bone density scheduled   2. Essential hypertension Blood pressure well controlled, goal bp <140/90 Continue current medications and dietary modifications follow metabolic panel   3. Chronic kidney disease, stage 3a  (HCC) Chronic and stable Encourage proper hydration Follow metabolic panel Avoid nephrotoxic meds (NSAIDS)  4. Hyperlipidemia, unspecified hyperlipidemia type cholesterol stable she does not wish to take any medication for this. Continue dietary regimen.  5. Primary osteoarthritis of left knee Controlled with exercise and tylenol PRN   6 months  Vearl Aitken K. 05/19/2021  [facility] [phone]

## 2022-10-04 DIAGNOSIS — Z23 Encounter for immunization: Secondary | ICD-10-CM | POA: Diagnosis not present

## 2022-10-06 DIAGNOSIS — H2513 Age-related nuclear cataract, bilateral: Secondary | ICD-10-CM | POA: Diagnosis not present

## 2022-10-24 DIAGNOSIS — M1712 Unilateral primary osteoarthritis, left knee: Secondary | ICD-10-CM | POA: Diagnosis not present

## 2022-11-17 ENCOUNTER — Other Ambulatory Visit: Payer: Self-pay | Admitting: Nurse Practitioner

## 2022-11-17 DIAGNOSIS — M1712 Unilateral primary osteoarthritis, left knee: Secondary | ICD-10-CM

## 2022-11-22 ENCOUNTER — Telehealth: Payer: Self-pay | Admitting: *Deleted

## 2022-11-22 DIAGNOSIS — M1712 Unilateral primary osteoarthritis, left knee: Secondary | ICD-10-CM | POA: Diagnosis not present

## 2022-11-22 DIAGNOSIS — M25562 Pain in left knee: Secondary | ICD-10-CM | POA: Diagnosis not present

## 2022-11-22 NOTE — Telephone Encounter (Signed)
Patient called requesting a Handicap Placard.   Filled out Placard and placed in Jessica's folder to review and sign.   Patient aware Shanda Bumps will not be back in office till Friday.

## 2022-11-24 ENCOUNTER — Ambulatory Visit
Admission: RE | Admit: 2022-11-24 | Discharge: 2022-11-24 | Disposition: A | Discharge status: Home / Self Care | Payer: Medicare Other | Source: Ambulatory Visit | Attending: Nurse Practitioner | Admitting: Nurse Practitioner

## 2022-11-24 DIAGNOSIS — M1712 Unilateral primary osteoarthritis, left knee: Secondary | ICD-10-CM | POA: Diagnosis not present

## 2022-11-24 DIAGNOSIS — M810 Age-related osteoporosis without current pathological fracture: Secondary | ICD-10-CM | POA: Diagnosis not present

## 2022-11-24 DIAGNOSIS — Z0000 Encounter for general adult medical examination without abnormal findings: Secondary | ICD-10-CM | POA: Insufficient documentation

## 2022-11-24 DIAGNOSIS — M25562 Pain in left knee: Secondary | ICD-10-CM | POA: Diagnosis not present

## 2022-11-24 DIAGNOSIS — E2839 Other primary ovarian failure: Secondary | ICD-10-CM | POA: Diagnosis not present

## 2022-11-25 NOTE — Telephone Encounter (Signed)
Patient aware form complete and asked that Sharon Seller, NP bring to the [facility] clinic on Tuesday. Copy of form made and sent to scanning.   Original placed on [place], Jessica K, NP desk with a handwritten note to take to [facility] on Tuesday (next scheduled clinic day)

## 2022-11-29 DIAGNOSIS — M25562 Pain in left knee: Secondary | ICD-10-CM | POA: Diagnosis not present

## 2022-11-29 DIAGNOSIS — M1712 Unilateral primary osteoarthritis, left knee: Secondary | ICD-10-CM | POA: Diagnosis not present

## 2022-12-01 ENCOUNTER — Encounter: Payer: Self-pay | Admitting: Nurse Practitioner

## 2022-12-01 ENCOUNTER — Ambulatory Visit: Payer: Medicare Other | Admitting: Nurse Practitioner

## 2022-12-01 VITALS — BP 130/80 | HR 76 | Temp 97.6°F | Ht 59.0 in | Wt 126.0 lb

## 2022-12-01 DIAGNOSIS — M1712 Unilateral primary osteoarthritis, left knee: Secondary | ICD-10-CM | POA: Diagnosis not present

## 2022-12-01 DIAGNOSIS — M810 Age-related osteoporosis without current pathological fracture: Secondary | ICD-10-CM | POA: Diagnosis not present

## 2022-12-01 MED ORDER — ROMOSOZUMAB-AQQG 105 MG/1.17ML ~~LOC~~ SOSY: 210.0000 mg | PREFILLED_SYRINGE | SUBCUTANEOUS | 11 refills | Status: DC

## 2022-12-01 NOTE — Progress Notes (Signed)
Careteam: Patient Care Team: Sharon Seller, NP as PCP - General (Geriatric Medicine)  Advanced Directive information Does Patient Have a Medical Advance Directive?: Yes, Type of Advance Directive: Healthcare Power of Angels;Living will;Out of facility DNR (pink MOST or yellow form), Pre-existing out of facility DNR order (yellow form or pink MOST form): Yellow form placed in chart (order not valid for inpatient use);Pink MOST form placed in chart (order not valid for inpatient use), Does patient want to make changes to medical advance directive?: No - Patient declined  No Known Allergies  Chief Complaint  Patient presents with   Follow-up    [facility]- Discuss bone density and treatment options. NCIR verified.      HPI: Patient is a 88 y.o. female seen in today at the [facility] for discussion of bone density Prior dexa showed osteopenia and she was on fosamax, she has been on this for several years.  She drives but locally only.  Willing to take evenity but does not wish to drive to [place] to get.  She said she could pick up at pharmacy and have nurse give to her at [facility].  She continues to take cal and vit d Doing routine exercise.    Review of Systems:  Review of Systems  Constitutional:  Negative for chills, fever and weight loss.  HENT:  Negative for tinnitus.   Respiratory:  Negative for cough, sputum production and shortness of breath.   Cardiovascular:  Negative for chest pain, palpitations and leg swelling.  Gastrointestinal:  Negative for abdominal pain, constipation, diarrhea and heartburn.  Genitourinary:  Negative for dysuria, frequency and urgency.  Musculoskeletal:  Positive for joint pain and myalgias. Negative for back pain and falls.  Skin: Negative.   Neurological:  Negative for dizziness and headaches.  Psychiatric/Behavioral:  Negative for depression and memory loss. The patient does not have insomnia.     Past Medical  History:  Diagnosis Date   Hyperlipidemia    Hypertension    Past Surgical History:  Procedure Laterality Date   OPEN REDUCTION INTERNAL FIXATION (ORIF) DISTAL RADIAL FRACTURE Right 07/31/2017   Procedure: OPEN REDUCTION INTERNAL FIXATION (ORIF) DISTAL RADIAL FRACTURE;  Surgeon: Deeann Saint, MD;  Location: [facility] ORS;  Service: Orthopedics;  Laterality: Right;   Social History:   reports that she has never smoked. She has never used smokeless tobacco. She reports that she does not drink alcohol and does not use drugs.  Family History  Problem Relation Age of Onset   Stroke Other        Parent   Stroke Mother    Hypertension Mother    Hypertension Father     Medications: Patient's Medications  New Prescriptions   No medications on file  Previous Medications   ALENDRONATE (FOSAMAX) 70 MG TABLET    TAKE 1 TABLET BY MOUTH EVERY 7 (SEVEN) DAYS. TAKE WITH A FULL GLASS OF WATER ON AN EMPTY STOMACH.   AMLODIPINE (NORVASC) 5 MG TABLET    TAKE 1 TABLET (5 MG TOTAL) BY MOUTH DAILY.   CHOLECALCIFEROL (VITAMIN D3) 25 MCG (1000 UT) TABLET    Take 1,000 Units by mouth daily.   MULTIPLE VITAMIN (MULTIVITAMIN WITH MINERALS) TABS TABLET    Take 1 tablet by mouth daily.   RED YEAST RICE 600 MG CAPS    Take 1 capsule by mouth 2 (two) times daily.  Modified Medications   No medications on file  Discontinued Medications   No medications  on file    Physical Exam:  Vitals:   12/01/22 0812  Height: 4\' 11"  (1.499 m)   Body mass index is 27.06 kg/m. Wt Readings from Last 3 Encounters:  09/27/22 134 lb (60.8 kg)  09/23/21 129 lb (58.5 kg)  09/21/21 129 lb (58.5 kg)    Physical Exam Constitutional:      Appearance: Normal appearance.  Pulmonary:     Effort: Pulmonary effort is normal.  Neurological:     Mental Status: She is alert. Mental status is at baseline.  Psychiatric:        Mood and Affect: Mood normal.     Labs reviewed: Basic Metabolic Panel: Recent Labs     09/22/22 0000  NA 140  K 4.0  CL 106  CO2 28*  BUN 18  CALCIUM 9.2   Liver Function Tests: Recent Labs    09/22/22 0000  AST 21  ALT 13  ALKPHOS 56  ALBUMIN 4.2   No results for input(s): "LIPASE", "AMYLASE" in the last 8760 hours. No results for input(s): "AMMONIA" in the last 8760 hours. CBC: Recent Labs    09/22/22 0000  WBC 6.6  NEUTROABS 3,102.00  HGB 13.8  HCT 41  PLT 270   Lipid Panel: Recent Labs    09/22/22 0000  CHOL 210*  HDL 47  LDLCALC 135  TRIG 11/22/22   TSH: No results for input(s): "TSH" in the last 8760 hours. A1C: Lab Results  Component Value Date   HGBA1C 5.7 03/19/2021     Assessment/Plan 1. Age-related osteoporosis without current pathological fracture -agreeable for evenity- plans to get through pharmacy and have nurse at [facility] administer monthly, she will take for a year then transition to prolia.  -Recommended to take calcium 600 mg twice daily with Vitamin D 2000 units daily and weight bearing activity 30 mins/5 days a week - Romosozumab-aqqg (EVENITY) 105 MG/1.05/19/2021 SOSY injection; Inject 210 mg into the skin every 30 (thirty) days.  Dispense: 2.4 mL; Refill: 11  2. Primary osteoarthritis of left knee Continues to go through PT    Next appt:  as scheduled, 03/30/2023 05/30/2023. Janene Harvey  [facility] [phone]

## 2022-12-06 DIAGNOSIS — M1712 Unilateral primary osteoarthritis, left knee: Secondary | ICD-10-CM | POA: Diagnosis not present

## 2022-12-06 DIAGNOSIS — M25562 Pain in left knee: Secondary | ICD-10-CM | POA: Diagnosis not present

## 2022-12-08 DIAGNOSIS — M1712 Unilateral primary osteoarthritis, left knee: Secondary | ICD-10-CM | POA: Diagnosis not present

## 2022-12-08 DIAGNOSIS — M25562 Pain in left knee: Secondary | ICD-10-CM | POA: Diagnosis not present

## 2022-12-10 ENCOUNTER — Other Ambulatory Visit: Payer: Self-pay | Admitting: Nurse Practitioner

## 2022-12-10 DIAGNOSIS — I10 Essential (primary) hypertension: Secondary | ICD-10-CM

## 2022-12-14 DIAGNOSIS — M1712 Unilateral primary osteoarthritis, left knee: Secondary | ICD-10-CM | POA: Diagnosis not present

## 2022-12-14 DIAGNOSIS — M25562 Pain in left knee: Secondary | ICD-10-CM | POA: Diagnosis not present

## 2022-12-16 DIAGNOSIS — M25562 Pain in left knee: Secondary | ICD-10-CM | POA: Diagnosis not present

## 2022-12-16 DIAGNOSIS — M1712 Unilateral primary osteoarthritis, left knee: Secondary | ICD-10-CM | POA: Diagnosis not present

## 2022-12-22 DIAGNOSIS — M1712 Unilateral primary osteoarthritis, left knee: Secondary | ICD-10-CM | POA: Diagnosis not present

## 2022-12-22 DIAGNOSIS — M25562 Pain in left knee: Secondary | ICD-10-CM | POA: Diagnosis not present

## 2022-12-29 DIAGNOSIS — M25562 Pain in left knee: Secondary | ICD-10-CM | POA: Diagnosis not present

## 2022-12-29 DIAGNOSIS — M1712 Unilateral primary osteoarthritis, left knee: Secondary | ICD-10-CM | POA: Diagnosis not present

## 2023-01-02 DIAGNOSIS — M25562 Pain in left knee: Secondary | ICD-10-CM | POA: Diagnosis not present

## 2023-01-02 DIAGNOSIS — M1712 Unilateral primary osteoarthritis, left knee: Secondary | ICD-10-CM | POA: Diagnosis not present

## 2023-01-05 DIAGNOSIS — M1712 Unilateral primary osteoarthritis, left knee: Secondary | ICD-10-CM | POA: Diagnosis not present

## 2023-01-05 DIAGNOSIS — M25562 Pain in left knee: Secondary | ICD-10-CM | POA: Diagnosis not present

## 2023-01-09 DIAGNOSIS — M25562 Pain in left knee: Secondary | ICD-10-CM | POA: Diagnosis not present

## 2023-01-09 DIAGNOSIS — M1712 Unilateral primary osteoarthritis, left knee: Secondary | ICD-10-CM | POA: Diagnosis not present

## 2023-01-16 DIAGNOSIS — M25562 Pain in left knee: Secondary | ICD-10-CM | POA: Diagnosis not present

## 2023-01-16 DIAGNOSIS — M1712 Unilateral primary osteoarthritis, left knee: Secondary | ICD-10-CM | POA: Diagnosis not present

## 2023-01-19 DIAGNOSIS — M1712 Unilateral primary osteoarthritis, left knee: Secondary | ICD-10-CM | POA: Diagnosis not present

## 2023-01-19 DIAGNOSIS — M25562 Pain in left knee: Secondary | ICD-10-CM | POA: Diagnosis not present

## 2023-01-23 DIAGNOSIS — M1712 Unilateral primary osteoarthritis, left knee: Secondary | ICD-10-CM | POA: Diagnosis not present

## 2023-01-23 DIAGNOSIS — M25562 Pain in left knee: Secondary | ICD-10-CM | POA: Diagnosis not present

## 2023-01-26 DIAGNOSIS — M25562 Pain in left knee: Secondary | ICD-10-CM | POA: Diagnosis not present

## 2023-01-26 DIAGNOSIS — M1712 Unilateral primary osteoarthritis, left knee: Secondary | ICD-10-CM | POA: Diagnosis not present

## 2023-01-30 DIAGNOSIS — M1712 Unilateral primary osteoarthritis, left knee: Secondary | ICD-10-CM | POA: Diagnosis not present

## 2023-01-30 DIAGNOSIS — M25562 Pain in left knee: Secondary | ICD-10-CM | POA: Diagnosis not present

## 2023-02-02 DIAGNOSIS — M25562 Pain in left knee: Secondary | ICD-10-CM | POA: Diagnosis not present

## 2023-02-02 DIAGNOSIS — M1712 Unilateral primary osteoarthritis, left knee: Secondary | ICD-10-CM | POA: Diagnosis not present

## 2023-02-06 DIAGNOSIS — M1712 Unilateral primary osteoarthritis, left knee: Secondary | ICD-10-CM | POA: Diagnosis not present

## 2023-02-06 DIAGNOSIS — M25562 Pain in left knee: Secondary | ICD-10-CM | POA: Diagnosis not present

## 2023-02-09 DIAGNOSIS — M1712 Unilateral primary osteoarthritis, left knee: Secondary | ICD-10-CM | POA: Diagnosis not present

## 2023-02-09 DIAGNOSIS — M25562 Pain in left knee: Secondary | ICD-10-CM | POA: Diagnosis not present

## 2023-02-13 DIAGNOSIS — M25562 Pain in left knee: Secondary | ICD-10-CM | POA: Diagnosis not present

## 2023-02-13 DIAGNOSIS — M1712 Unilateral primary osteoarthritis, left knee: Secondary | ICD-10-CM | POA: Diagnosis not present

## 2023-02-15 DIAGNOSIS — M1712 Unilateral primary osteoarthritis, left knee: Secondary | ICD-10-CM | POA: Diagnosis not present

## 2023-02-15 DIAGNOSIS — M25562 Pain in left knee: Secondary | ICD-10-CM | POA: Diagnosis not present

## 2023-03-30 ENCOUNTER — Ambulatory Visit: Payer: Medicare Other | Admitting: Nurse Practitioner

## 2023-03-30 ENCOUNTER — Encounter: Payer: Self-pay | Admitting: Nurse Practitioner

## 2023-03-30 VITALS — BP 138/84 | HR 70 | Temp 97.7°F | Ht 59.0 in | Wt 136.0 lb

## 2023-03-30 DIAGNOSIS — I10 Essential (primary) hypertension: Secondary | ICD-10-CM

## 2023-03-30 DIAGNOSIS — M1712 Unilateral primary osteoarthritis, left knee: Secondary | ICD-10-CM

## 2023-03-30 DIAGNOSIS — N1831 Chronic kidney disease, stage 3a: Secondary | ICD-10-CM

## 2023-03-30 DIAGNOSIS — M810 Age-related osteoporosis without current pathological fracture: Secondary | ICD-10-CM | POA: Diagnosis not present

## 2023-03-30 DIAGNOSIS — E785 Hyperlipidemia, unspecified: Secondary | ICD-10-CM | POA: Diagnosis not present

## 2023-03-30 MED ORDER — ROMOSOZUMAB-AQQG 105 MG/1.17ML ~~LOC~~ SOSY: 210.0000 mg | PREFILLED_SYRINGE | SUBCUTANEOUS | 11 refills | Status: DC

## 2023-03-30 NOTE — Patient Instructions (Signed)
Follow up in 6 months- come to [facility] for labs prior to appt.

## 2023-03-30 NOTE — Progress Notes (Signed)
Careteam: Patient Care Team: Sharon SellerEubanks, Jaymie Mckiddy K, NP as PCP - General (Geriatric Medicine)  PLACE OF SERVICE:  [facility]  Advanced Directive information Does Patient Have a Medical Advance Directive?: Yes, Type of Advance Directive: Healthcare Power of EaklyAttorney;Living will;Out of facility DNR (pink MOST or yellow form), Does patient want to make changes to medical advance directive?: No - Patient declined  No Known Allergies  Chief Complaint  Patient presents with   Medical Management of Chronic Issues    Medical Management of Chronic Issues. 6 Month Follow up     HPI: Patient is a 89 y.o. female for routine follow up.   Reports she never got evenity. States the pharmacy said they would order but never heard from them afterwards.   She has had worsening knee/hip pain in the last year and this is effecting her mobility. She is not 100%. Completed PT. No pain but hears her knee.  No falls.  She walks slowly.  Reports she will need another handicap placard. Walking far distances tires her out.  Progressive weakness even though she is trying to stay active.   Htn-controlled on norvasc. Needing refill.   Eating well. Sleeping well.  No falls.    Review of Systems:  Review of Systems  Constitutional:  Negative for chills, fever and weight loss.  HENT:  Negative for tinnitus.   Respiratory:  Negative for cough, sputum production and shortness of breath.   Cardiovascular:  Negative for chest pain, palpitations and leg swelling.  Gastrointestinal:  Negative for abdominal pain, constipation, diarrhea and heartburn.  Genitourinary:  Negative for dysuria, frequency and urgency.  Musculoskeletal:  Negative for back pain, falls, joint pain and myalgias.  Skin: Negative.   Neurological:  Negative for dizziness and headaches.  Psychiatric/Behavioral:  Negative for depression and memory loss. The patient does not have insomnia.     Past Medical History:  Diagnosis Date    Hyperlipidemia    Hypertension    Past Surgical History:  Procedure Laterality Date   OPEN REDUCTION INTERNAL FIXATION (ORIF) DISTAL RADIAL FRACTURE Right 07/31/2017   Procedure: OPEN REDUCTION INTERNAL FIXATION (ORIF) DISTAL RADIAL FRACTURE;  Surgeon: Deeann SaintMiller, Howard, MD;  Location: [facility] ORS;  Service: Orthopedics;  Laterality: Right;   Social History:   reports that she has never smoked. She has never used smokeless tobacco. She reports that she does not drink alcohol and does not use drugs.  Family History  Problem Relation Age of Onset   Stroke Other        Parent   Stroke Mother    Hypertension Mother    Hypertension Father     Medications: Patient's Medications  New Prescriptions   No medications on file  Previous Medications   AMLODIPINE (NORVASC) 5 MG TABLET    TAKE 1 TABLET (5 MG TOTAL) BY MOUTH DAILY.   CHOLECALCIFEROL (VITAMIN D3) 25 MCG (1000 UT) TABLET    Take 1,000 Units by mouth daily.   MULTIPLE VITAMIN (MULTIVITAMIN WITH MINERALS) TABS TABLET    Take 1 tablet by mouth daily.   RED YEAST RICE 600 MG CAPS    Take 1 capsule by mouth 2 (two) times daily.  Modified Medications   No medications on file  Discontinued Medications   ALENDRONATE (FOSAMAX) 70 MG TABLET    TAKE 1 TABLET BY MOUTH EVERY 7 (SEVEN) DAYS. TAKE WITH A FULL GLASS OF WATER ON AN EMPTY STOMACH.   ROMOSOZUMAB-AQQG (EVENITY) 105 MG/1.17ML SOSY INJECTION    Inject  210 mg into the skin every 30 (thirty) days.    Physical Exam:  Vitals:   03/30/23 0848  BP: 138/84  Pulse: 70  Temp: 97.7 F (36.5 C)  SpO2: 97%  Weight: 136 lb (61.7 kg)  Height: 4\' 11"  (1.499 m)   Body mass index is 27.47 kg/m. Wt Readings from Last 3 Encounters:  03/30/23 136 lb (61.7 kg)  12/01/22 126 lb (57.2 kg)  09/27/22 134 lb (60.8 kg)    Physical Exam Constitutional:      General: She is not in acute distress.    Appearance: She is well-developed. She is not diaphoretic.  HENT:     Head: Normocephalic and  atraumatic.     Mouth/Throat:     Pharynx: No oropharyngeal exudate.  Eyes:     Conjunctiva/sclera: Conjunctivae normal.     Pupils: Pupils are equal, round, and reactive to light.  Cardiovascular:     Rate and Rhythm: Normal rate and regular rhythm.     Heart sounds: Normal heart sounds.  Pulmonary:     Effort: Pulmonary effort is normal.     Breath sounds: Normal breath sounds.  Abdominal:     General: Bowel sounds are normal.     Palpations: Abdomen is soft.  Musculoskeletal:     Cervical back: Normal range of motion and neck supple.     Right lower leg: No edema.     Left lower leg: No edema.  Skin:    General: Skin is warm and dry.  Neurological:     Mental Status: She is alert.  Psychiatric:        Mood and Affect: Mood normal.     Labs reviewed: Basic Metabolic Panel: Recent Labs    09/22/22 0000  NA 140  K 4.0  CL 106  CO2 28*  BUN 18  CALCIUM 9.2   Liver Function Tests: Recent Labs    09/22/22 0000  AST 21  ALT 13  ALKPHOS 56  ALBUMIN 4.2   No results for input(s): "LIPASE", "AMYLASE" in the last 8760 hours. No results for input(s): "AMMONIA" in the last 8760 hours. CBC: Recent Labs    09/22/22 0000  WBC 6.6  NEUTROABS 3,102.00  HGB 13.8  HCT 41  PLT 270   Lipid Panel: Recent Labs    09/22/22 0000  CHOL 210*  HDL 47  LDLCALC 135  TRIG 312   TSH: No results for input(s): "TSH" in the last 8760 hours. A1C: Lab Results  Component Value Date   HGBA1C 5.7 03/19/2021     Assessment/Plan 1. Age-related osteoporosis without current pathological fracture -did not hear back from her pharmacy after ordered. Will resend.  -Recommended to take calcium 600 mg twice daily with Vitamin D 2000 units daily and weight bearing activity 30 mins/5 days a week  - Romosozumab-aqqg (EVENITY) 105 MG/1. SOSY injection; Inject 210 mg into the skin every 30 (thirty) days.  Dispense: 2.4 mL; Refill: 11  2. Essential hypertension -Blood pressure  well controlled, goal bp <140/90 Continue current medications and dietary modifications follow metabolic panel - Complete Metabolic Panel with eGFR - CBC with Differential/Platelet  3. Primary osteoarthritis of left knee Ongoing, continues exercise with strength training. Makes it hard for her to walk long distances.   4. Hyperlipidemia, unspecified hyperlipidemia type -continues with dietary modifications.  - Complete Metabolic Panel with eGFR - Lipid panel  Return in about 1 year (around 03/29/2024) for yearly follow up AWV in 6 months.  Carlos American. [facility] [phone]

## 2023-03-31 DIAGNOSIS — N1831 Chronic kidney disease, stage 3a: Secondary | ICD-10-CM | POA: Insufficient documentation

## 2023-03-31 MED ORDER — AMLODIPINE BESYLATE 5 MG PO TABS: 5.0000 mg | ORAL_TABLET | Freq: Every day | ORAL | 1 refills | Status: DC

## 2023-04-06 ENCOUNTER — Telehealth: Payer: Self-pay

## 2023-04-06 DIAGNOSIS — N1831 Chronic kidney disease, stage 3a: Secondary | ICD-10-CM

## 2023-04-06 DIAGNOSIS — I10 Essential (primary) hypertension: Secondary | ICD-10-CM

## 2023-04-06 DIAGNOSIS — M810 Age-related osteoporosis without current pathological fracture: Secondary | ICD-10-CM

## 2023-04-06 NOTE — Telephone Encounter (Signed)
Called [facility] [phone] and spoke with Tom. He stated that we would need to call [facility] and once we received the Approval to call them back and inform them and they would ship out the medication.   Called [facility] and spoke with Joni Reining and she stated that she cannot pull up the patient and that she thinks that the Sheilah Mins is covered by Medicare and for me to call Medicare at [phone].   Will call tomorrow to initiate.

## 2023-04-06 NOTE — Telephone Encounter (Signed)
Patient called saying that she was prescribed evenity and she received a message from the pharmacy sying that our office needed to call before they could dispense medication. She left number to cal the pharmacy. [phone].  I called the number [facility] and spoke with [facility]. He stated that a prior authorization needs to be done.  Message routed to Abbey Chatters, [place] and [place], [place]

## 2023-04-07 NOTE — Telephone Encounter (Signed)
Tried to initiate Prior Authorization through [employer] and it could not find patient in system with insurance on file.   Tried calling patient to get an updated insurance card. Awaiting call back.

## 2023-04-10 NOTE — Telephone Encounter (Signed)
Patient notified and will bring a current copy of cards.

## 2023-04-12 NOTE — Telephone Encounter (Signed)
Received patient's updated insurance cards and sent copy to scanning.   Initiated Prior authorization through [employer] for [employer].   Information has been submitted to [facility] Key: [id number] PA Case ID: [id number]  24-72 hour determination.  Awaiting results.

## 2023-04-17 NOTE — Telephone Encounter (Signed)
[employer] Denied Evenity Injections. Please Advise.

## 2023-04-18 NOTE — Telephone Encounter (Signed)
Please call patient and see if she is willing to do forteo- which is a daily injection- we can send this to her pharmacy- insurance wants her to try another medication before they approve evenity

## 2023-04-19 NOTE — Telephone Encounter (Signed)
Patient notified. Patient does NOT want to take daily Injections and wants to know if there is something in Pill form that she can take instead.   Please Advise.

## 2023-04-24 NOTE — Telephone Encounter (Signed)
Can we see if reclast yearly infusion would be cover- also would need to figure out where she would need to go in [place].

## 2023-05-01 NOTE — Telephone Encounter (Signed)
Patient would need a BMP or CMP scheduled to fill out paperwork to submit for Reclast.   Lab orders are in [facility] but they are scheduled to be drawn 09/21/2023  Please Advise.

## 2023-05-02 NOTE — Telephone Encounter (Signed)
Order printed and left in Tray at [facility] for [facility] to draw on Thursday 05/04/23.

## 2023-05-02 NOTE — Telephone Encounter (Signed)
Order signed

## 2023-05-02 NOTE — Telephone Encounter (Signed)
Spoke with Patient and she agreed and will come for labs on Thursday.   Pended labs and sent to [facility] for approval.

## 2023-05-02 NOTE — Telephone Encounter (Signed)
Per Jessica--See if patient can come in on Thursday to [facility] at 7:30am to have labs drawn for the Reclast.    Tried calling patient and LMOM to return call.

## 2023-05-04 DIAGNOSIS — I10 Essential (primary) hypertension: Secondary | ICD-10-CM | POA: Diagnosis not present

## 2023-05-04 DIAGNOSIS — M810 Age-related osteoporosis without current pathological fracture: Secondary | ICD-10-CM | POA: Diagnosis not present

## 2023-05-04 DIAGNOSIS — N1831 Chronic kidney disease, stage 3a: Secondary | ICD-10-CM | POA: Diagnosis not present

## 2023-05-04 LAB — BASIC METABOLIC PANEL
BUN/Creatinine Ratio: 24 (calc) — ABNORMAL HIGH (ref 6–22)
BUN: 24 mg/dL (ref 7–25)
CO2: 25 mmol/L (ref 20–32)
Calcium: 9.1 mg/dL (ref 8.6–10.4)
Chloride: 106 mmol/L (ref 98–110)
Creat: 0.98 mg/dL — ABNORMAL HIGH (ref 0.60–0.95)
Glucose, Bld: 95 mg/dL (ref 65–99)
Potassium: 4.1 mmol/L (ref 3.5–5.3)
Sodium: 140 mmol/L (ref 135–146)

## 2023-05-11 ENCOUNTER — Telehealth: Payer: Self-pay | Admitting: *Deleted

## 2023-05-11 NOTE — Telephone Encounter (Signed)
Patient notified and agreed.  Patient wrote down appointment and address.

## 2023-05-11 NOTE — Telephone Encounter (Signed)
Patient Reclast Infusion has been set up with [facility] [phone] FAX: [phone]  APPT: 05/17/2023 @ 1:00pm  Address:[address] and then to Registration and they will show from there.    Tried calling patient regarding appointment. LMOM to return call.

## 2023-05-11 NOTE — Telephone Encounter (Signed)
Paperwork Faxed to [employer] [facility] at [phone]. Reclast Order Form, Demographics,and Insurance.   Will call #[phone] and confirm appointment

## 2023-05-17 ENCOUNTER — Ambulatory Visit
Admission: RE | Admit: 2023-05-17 | Discharge: 2023-05-17 | Disposition: A | Discharge status: Home / Self Care | Payer: Medicare Other | Source: Ambulatory Visit | Attending: Nurse Practitioner | Admitting: Nurse Practitioner

## 2023-05-17 DIAGNOSIS — M810 Age-related osteoporosis without current pathological fracture: Secondary | ICD-10-CM | POA: Insufficient documentation

## 2023-05-17 MED ORDER — ZOLEDRONIC ACID 5 MG/100ML IV SOLN
5.0000 mg | Freq: Once | INTRAVENOUS | Status: AC
  Administered 2023-05-17: 5 mg via INTRAVENOUS
  Filled 2023-05-17: qty 100

## 2023-09-21 DIAGNOSIS — E785 Hyperlipidemia, unspecified: Secondary | ICD-10-CM | POA: Diagnosis not present

## 2023-09-21 DIAGNOSIS — I10 Essential (primary) hypertension: Secondary | ICD-10-CM | POA: Diagnosis not present

## 2023-09-21 LAB — COMPLETE METABOLIC PANEL WITH GFR
AG Ratio: 1.3 (calc) (ref 1.0–2.5)
ALT: 21 U/L (ref 6–29)
AST: 21 U/L (ref 10–35)
Albumin: 4.3 g/dL (ref 3.6–5.1)
Alkaline phosphatase (APISO): 63 U/L (ref 37–153)
BUN/Creatinine Ratio: 16 (calc) (ref 6–22)
BUN: 16 mg/dL (ref 7–25)
CO2: 26 mmol/L (ref 20–32)
Calcium: 9.5 mg/dL (ref 8.6–10.4)
Chloride: 104 mmol/L (ref 98–110)
Creat: 0.99 mg/dL — ABNORMAL HIGH (ref 0.60–0.95)
Globulin: 3.3 g/dL (ref 1.9–3.7)
Glucose, Bld: 88 mg/dL (ref 65–99)
Potassium: 3.7 mmol/L (ref 3.5–5.3)
Sodium: 139 mmol/L (ref 135–146)
Total Bilirubin: 0.6 mg/dL (ref 0.2–1.2)
Total Protein: 7.6 g/dL (ref 6.1–8.1)
eGFR: 53 mL/min/{1.73_m2} — ABNORMAL LOW (ref >=60)

## 2023-09-21 LAB — CBC WITH DIFFERENTIAL/PLATELET
Absolute Monocytes: 474 {cells}/uL (ref 200–950)
Basophils Absolute: 38 {cells}/uL (ref 0–200)
Basophils Relative: 0.6 %
Eosinophils Absolute: 198 {cells}/uL (ref 15–500)
Eosinophils Relative: 3.1 %
HCT: 42.8 % (ref 35.0–45.0)
Hemoglobin: 14.1 g/dL (ref 11.7–15.5)
Lymphs Abs: 2451 {cells}/uL (ref 850–3900)
MCH: 30.7 pg (ref 27.0–33.0)
MCHC: 32.9 g/dL (ref 32.0–36.0)
MCV: 93.2 fL (ref 80.0–100.0)
MPV: 10.9 fL (ref 7.5–12.5)
Monocytes Relative: 7.4 %
Neutro Abs: 3238 {cells}/uL (ref 1500–7800)
Neutrophils Relative %: 50.6 %
Platelets: 289 10*3/uL (ref 140–400)
RBC: 4.59 10*6/uL (ref 3.80–5.10)
RDW: 13.6 % (ref 11.0–15.0)
Total Lymphocyte: 38.3 %
WBC: 6.4 10*3/uL (ref 3.8–10.8)

## 2023-09-21 LAB — LIPID PANEL
Cholesterol: 221 mg/dL — ABNORMAL HIGH (ref <200)
HDL: 48 mg/dL — ABNORMAL LOW (ref >=50)
LDL Cholesterol (Calc): 147 mg/dL — ABNORMAL HIGH
Non-HDL Cholesterol (Calc): 173 mg/dL — ABNORMAL HIGH (ref <130)
Total CHOL/HDL Ratio: 4.6 (calc) (ref <5.0)
Triglycerides: 134 mg/dL (ref <150)

## 2023-09-25 ENCOUNTER — Encounter: Payer: Self-pay | Admitting: Nurse Practitioner

## 2023-09-26 ENCOUNTER — Encounter: Payer: Self-pay | Admitting: Nurse Practitioner

## 2023-09-26 ENCOUNTER — Ambulatory Visit (INDEPENDENT_AMBULATORY_CARE_PROVIDER_SITE_OTHER): Payer: Medicare Other | Admitting: Nurse Practitioner

## 2023-09-26 VITALS — BP 136/80 | HR 66 | Temp 97.5°F | Ht 59.0 in | Wt 139.0 lb

## 2023-09-26 DIAGNOSIS — Z0000 Encounter for general adult medical examination without abnormal findings: Secondary | ICD-10-CM

## 2023-09-26 DIAGNOSIS — E785 Hyperlipidemia, unspecified: Secondary | ICD-10-CM | POA: Diagnosis not present

## 2023-09-26 DIAGNOSIS — I10 Essential (primary) hypertension: Secondary | ICD-10-CM | POA: Diagnosis not present

## 2023-09-26 NOTE — Patient Instructions (Addendum)
  Lori Edwards , Thank you for taking time to come for your Medicare Wellness Visit. I appreciate your ongoing commitment to your health goals. Please review the following plan we discussed and let me know if I can assist you in the future.   These are the goals we discussed:  Goals      Patient Stated     To maintain current lifestyle         This is a list of the screening recommended for you and due dates:  Health Maintenance  Topic Date Due   Flu Shot  07/20/2023   Medicare Annual Wellness Visit  09/25/2024   DTaP/Tdap/Td vaccine (2 - Td or Tdap) 09/10/2028   Pneumonia Vaccine  Completed   DEXA scan (bone density measurement)  Completed   Zoster (Shingles) Vaccine  Completed   HPV Vaccine  Aged Out   COVID-19 Vaccine  Discontinued   To get flu shot at [facility].    To follow up blood work on [facility] 4/14 at [facility]

## 2023-09-26 NOTE — Progress Notes (Signed)
Subjective:   Lori Edwards is a 89 y.o. female who presents for Medicare Annual (Subsequent) preventive examination.  Visit Complete: In person   Cardiac Risk Factors include: advanced age (>2men, >46 women);hypertension     Objective:    Today's Vitals   09/26/23 0814  BP: 136/80  Pulse: 66  Temp: (!) 97.5 F (36.4 C)  TempSrc: Temporal  SpO2: 97%  Weight: 139 lb (63 kg)  Height: 4\' 11"  (1.499 m)   Body mass index is 28.07 kg/m.     09/26/2023    9:05 AM 03/30/2023    8:51 AM 12/01/2022    8:08 AM 09/27/2022    8:59 AM 09/22/2022    8:45 AM 09/21/2022    4:48 PM 09/23/2021    8:55 AM  Advanced Directives  Does Patient Have a Medical Advance Directive? Yes Yes Yes Yes Yes Yes Yes  Type of Advance Directive Out of facility DNR (pink MOST or yellow form) Healthcare Power of [place];Living will;Out of facility DNR (pink MOST or yellow form) Healthcare Power of [place];Living will;Out of facility DNR (pink MOST or yellow form) Healthcare Power of [place];Living will;Out of facility DNR (pink MOST or yellow form) Healthcare Power of [place];Living will;Out of facility DNR (pink MOST or yellow form) Healthcare Power of [place];Living will;Out of facility DNR (pink MOST or yellow form) Living will  Does patient want to make changes to medical advance directive? No - Patient declined No - Patient declined No - Patient declined No - Patient declined No - Patient declined No - Patient declined No - Patient declined  Copy of Healthcare Power of Attorney in Chart?  No - copy requested Yes - validated most recent copy scanned in chart (See row information) Yes - validated most recent copy scanned in chart (See row information) Yes - validated most recent copy scanned in chart (See row information) Yes - validated most recent copy scanned in chart (See row information)   Pre-existing out of facility DNR order (yellow form or pink MOST form) Yellow form placed in chart (order not  valid for inpatient use);Pink MOST form placed in chart (order not valid for inpatient use)  Yellow form placed in chart (order not valid for inpatient use);Pink MOST form placed in chart (order not valid for inpatient use) Pink MOST form placed in chart (order not valid for inpatient use) Pink MOST form placed in chart (order not valid for inpatient use) Pink MOST form placed in chart (order not valid for inpatient use)     Current Medications (verified) Outpatient Encounter Medications as of 09/26/2023  Medication Sig   amLODipine (NORVASC) 5 MG tablet Take 1 tablet (5 mg total) by mouth daily.   cholecalciferol (VITAMIN D3) 25 MCG (1000 UT) tablet Take 1,000 Units by mouth daily.   Multiple Vitamin (MULTIVITAMIN WITH MINERALS) TABS tablet Take 1 tablet by mouth daily.   zoledronic acid (RECLAST) 5 MG/100ML SOLN injection Inject 5 mg into the vein as directed. Once yearly   [DISCONTINUED] Red Yeast Rice 600 MG CAPS Take 1 capsule by mouth 2 (two) times daily.   [DISCONTINUED] Romosozumab-aqqg (EVENITY) 105 MG/1. SOSY injection Inject 210 mg into the skin every 30 (thirty) days. (Patient not taking: Reported on 05/17/2023)   No facility-administered encounter medications on file as of 09/26/2023.    Allergies (verified) Patient has no known allergies.   History: Past Medical History:  Diagnosis Date   Hyperlipidemia    Hypertension    Past Surgical History:  Procedure Laterality Date   OPEN REDUCTION INTERNAL FIXATION (ORIF) DISTAL RADIAL FRACTURE Right 07/31/2017   Procedure: OPEN REDUCTION INTERNAL FIXATION (ORIF) DISTAL RADIAL FRACTURE;  Surgeon: Deeann Saint, MD;  Location: [facility] ORS;  Service: Orthopedics;  Laterality: Right;   Family History  Problem Relation Age of Onset   Stroke Other        Parent   Stroke Mother    Hypertension Mother    Hypertension Father    Social History   Socioeconomic History   Marital status: Widowed    Spouse name: Not on file   Number  of children: Not on file   Years of education: Not on file   Highest education level: Not on file  Occupational History   Not on file  Tobacco Use   Smoking status: Never   Smokeless tobacco: Never  Vaping Use   Vaping status: Never Used  Substance and Sexual Activity   Alcohol use: No   Drug use: No   Sexual activity: Not on file  Other Topics Concern   Not on file  Social History Narrative   Not on file   Social Determinants of Health   Financial Resource Strain: Low Risk  (07/12/2018)   Overall Financial Resource Strain (CARDIA)    Difficulty of Paying Living Expenses: Not hard at all  Food Insecurity: No Food Insecurity (07/12/2018)   Hunger Vital Sign    Worried About Running Out of Food in the Last Year: Never true    Ran Out of Food in the Last Year: Never true  Transportation Needs: No Transportation Needs (07/12/2018)   PRAPARE - Administrator, Civil Service (Medical): No    Lack of Transportation (Non-Medical): No  Physical Activity: Unknown (09/13/2019)   Exercise Vital Sign    Days of Exercise per Week: Not on file    Minutes of Exercise per Session: 30 min  Stress: No Stress Concern Present (07/12/2018)   [employer] of Occupational Health - Occupational Stress Questionnaire    Feeling of Stress : Not at all  Social Connections: Not on file    Tobacco Counseling Counseling given: Not Answered   Clinical Intake:  Pre-visit preparation completed: Yes  Pain : No/denies pain     BMI - recorded: 28 Nutritional Status: BMI 25 -29 Overweight Nutritional Risks: None Diabetes: No  How often do you need to have someone help you when you read instructions, pamphlets, or other written materials from your doctor or pharmacy?: 1 - Never         Activities of Daily Living    09/26/2023    9:15 AM  In your present state of health, do you have any difficulty performing the following activities:  Hearing? 0  Vision? 0  Difficulty  concentrating or making decisions? 0  Walking or climbing stairs? 0  Dressing or bathing? 0  Doing errands, shopping? 0  Preparing Food and eating ? N  Using the Toilet? N  In the past six months, have you accidently leaked urine? Y  Do you have problems with loss of bowel control? N  Managing your Medications? N  Managing your Finances? N  Housekeeping or managing your Housekeeping? N    Patient Care Team: Sharon Seller, NP as PCP - General (Geriatric Medicine)  Indicate any recent Medical Services you may have received from other than [facility] providers in the past year (date may be approximate).     Assessment:   This is a routine  wellness examination for Lori Edwards.  Hearing/Vision screen Hearing Screening - Comments:: No hearing issues  Vision Screening - Comments:: Last eye exam 10/06/22, pending appointment on 10/12/23 with Dr.King, Elige Radon   Goals Addressed   None    Depression Screen    09/26/2023    9:04 AM 03/30/2023    8:51 AM 09/27/2022    8:59 AM 09/22/2022    8:42 AM 09/21/2021    9:39 AM 09/15/2020    8:58 AM 05/07/2020    1:28 PM  PHQ 2/9 Scores  PHQ - 2 Score 0 0 0 0 0 0 0    Fall Risk    09/26/2023    9:04 AM 03/30/2023    8:51 AM 12/01/2022    8:25 AM 09/27/2022    8:59 AM 09/22/2022    8:42 AM  Fall Risk   Falls in the past year? 0 0 0 0 0  Number falls in past yr: 0 0 0 0 0  Injury with Fall? 0 0 0 0 0  Risk for fall due to : No Fall Risks  No Fall Risks No Fall Risks No Fall Risks  Follow up Falls evaluation completed  Falls evaluation completed Falls evaluation completed Falls evaluation completed    MEDICARE RISK AT HOME:    TIMED UP AND GO:  Was the test performed?  No    Cognitive Function:    09/26/2023    9:11 AM 09/21/2021    9:41 AM 09/15/2020    9:01 AM 07/12/2018    9:42 AM  MMSE - Mini Mental State Exam  Orientation to time 4 5 5 5   Orientation to Place 5 5 5 5   Registration 3 3 3 3   Attention/ Calculation 5 5 5 5    Recall 3 3 3 3   Language- name 2 objects 2 2 2 2   Language- repeat 1 0 1 1  Language- follow 3 step command 3 3 3 3   Language- read & follow direction 1 1 1 1   Write a sentence 1 1 1 1   Copy design 1 1 1 1   Total score 29 29 30 30         09/22/2022    8:45 AM 09/13/2019   10:13 AM  6CIT Screen  What Year? 0 points 0 points  What month? 0 points 0 points  What time? 0 points 0 points  Count back from 20 0 points 0 points  Months in reverse 0 points 0 points  Repeat phrase 0 points 0 points  Total Score 0 points 0 points    Immunizations Immunization History  Administered Date(s) Administered   Fluad Quad(high Dose 65+) 08/29/2019   Influenza, High Dose Seasonal PF 08/25/2016, 09/07/2017, 09/10/2018, 09/12/2020, 10/05/2022   Moderna Covid-19 Vaccine Bivalent Booster 5yrs & up 05/17/2022   Moderna Sars-Covid-2 Vaccination 12/31/2019, 01/28/2020, 05/06/2021   PFIZER(Purple Top)SARS-COV-2 Vaccination 09/09/2021   Pneumococcal Conjugate-13 08/03/2017   Pneumococcal Polysaccharide-23 09/18/2006   Tdap 09/10/2018   Zoster Recombinant(Shingrix) 09/13/2019, 03/07/2020    TDAP status: Up to date  Flu Vaccine status: Due, Education has been provided regarding the importance of this vaccine. Advised may receive this vaccine at local pharmacy or Health Dept. Aware to provide a copy of the vaccination record if obtained from local pharmacy or Health Dept. Verbalized acceptance and understanding.  Pneumococcal vaccine status: Up to date  Covid-19 vaccine status: Information provided on how to obtain vaccines.   Qualifies for Shingles Vaccine? Yes   Zostavax completed No  Shingrix Completed?: Yes  Screening Tests Health Maintenance  Topic Date Due   INFLUENZA VACCINE  07/20/2023   Medicare Annual Wellness (AWV)  09/25/2024   DTaP/Tdap/Td (2 - Td or Tdap) 09/10/2028   Pneumonia Vaccine 83+ Years old  Completed   DEXA SCAN  Completed   Zoster Vaccines- Shingrix  Completed    HPV VACCINES  Aged Out   COVID-19 Vaccine  Discontinued    Health Maintenance  Health Maintenance Due  Topic Date Due   INFLUENZA VACCINE  07/20/2023    Colorectal cancer screening: No longer required.   Mammogram status: No longer required due to age.  Bone Density status: Completed 11/24/2022. Results reflect: Bone density results: OSTEOPOROSIS. Repeat every 2 years.  Lung Cancer Screening: (Low Dose CT Chest recommended if Age 42-80 years, 20 pack-year currently smoking OR have quit w/in 15years.) does not qualify.   Lung Cancer Screening Referral: na  Additional Screening:  Hepatitis C Screening: does not qualify Vision Screening: Recommended annual ophthalmology exams for early detection of glaucoma and other disorders of the eye. Is the patient up to date with their annual eye exam?  Yes  Who is the provider or what is the name of the office in which the patient attends annual eye exams? [facility] If pt is not established with a provider, would they like to be referred to a provider to establish care? No .   Dental Screening: Recommended annual dental exams for proper oral hygiene   Community Resource Referral / Chronic Care Management: CRR required this visit?  No   CCM required this visit?  No     Plan:     I have personally reviewed and noted the following in the patient's chart:   Medical and social history Use of alcohol, tobacco or illicit drugs  Current medications and supplements including opioid prescriptions. Patient is not currently taking opioid prescriptions. Functional ability and status Nutritional status Physical activity Advanced directives List of other physicians Hospitalizations, surgeries, and ER visits in previous 12 months Vitals Screenings to include cognitive, depression, and falls Referrals and appointments  In addition, I have reviewed and discussed with patient certain preventive protocols, quality metrics, and best practice  recommendations. A written personalized care plan for preventive services as well as general preventive health recommendations were provided to patient.     Sharon Seller, NP   09/26/2023

## 2023-10-05 DIAGNOSIS — L719 Rosacea, unspecified: Secondary | ICD-10-CM | POA: Diagnosis not present

## 2023-10-12 DIAGNOSIS — Z23 Encounter for immunization: Secondary | ICD-10-CM | POA: Diagnosis not present

## 2023-10-12 DIAGNOSIS — H2513 Age-related nuclear cataract, bilateral: Secondary | ICD-10-CM | POA: Diagnosis not present

## 2023-10-19 ENCOUNTER — Inpatient Hospital Stay
Admission: EM | Admit: 2023-10-19 | Discharge: 2023-10-23 | DRG: 482 | Disposition: A | Discharge status: Skilled Nursing Facility | Payer: Medicare Other | Source: Skilled Nursing Facility | Attending: Internal Medicine | Admitting: Internal Medicine

## 2023-10-19 ENCOUNTER — Other Ambulatory Visit: Payer: Self-pay

## 2023-10-19 ENCOUNTER — Encounter: Payer: Self-pay | Admitting: Emergency Medicine

## 2023-10-19 ENCOUNTER — Emergency Department: Payer: Medicare Other

## 2023-10-19 DIAGNOSIS — S7222XA Displaced subtrochanteric fracture of left femur, initial encounter for closed fracture: Secondary | ICD-10-CM | POA: Diagnosis not present

## 2023-10-19 DIAGNOSIS — E876 Hypokalemia: Secondary | ICD-10-CM | POA: Diagnosis present

## 2023-10-19 DIAGNOSIS — S79002A Unspecified physeal fracture of upper end of left femur, initial encounter for closed fracture: Secondary | ICD-10-CM | POA: Diagnosis not present

## 2023-10-19 DIAGNOSIS — M6281 Muscle weakness (generalized): Secondary | ICD-10-CM | POA: Diagnosis not present

## 2023-10-19 DIAGNOSIS — Y92129 Unspecified place in nursing home as the place of occurrence of the external cause: Secondary | ICD-10-CM

## 2023-10-19 DIAGNOSIS — S72009A Fracture of unspecified part of neck of unspecified femur, initial encounter for closed fracture: Secondary | ICD-10-CM | POA: Diagnosis not present

## 2023-10-19 DIAGNOSIS — E785 Hyperlipidemia, unspecified: Secondary | ICD-10-CM | POA: Diagnosis not present

## 2023-10-19 DIAGNOSIS — S72002A Fracture of unspecified part of neck of left femur, initial encounter for closed fracture: Secondary | ICD-10-CM | POA: Diagnosis not present

## 2023-10-19 DIAGNOSIS — Z823 Family history of stroke: Secondary | ICD-10-CM | POA: Diagnosis not present

## 2023-10-19 DIAGNOSIS — I700 Atherosclerosis of aorta: Secondary | ICD-10-CM | POA: Diagnosis not present

## 2023-10-19 DIAGNOSIS — W19XXXA Unspecified fall, initial encounter: Secondary | ICD-10-CM | POA: Diagnosis present

## 2023-10-19 DIAGNOSIS — Z9689 Presence of other specified functional implants: Secondary | ICD-10-CM | POA: Diagnosis not present

## 2023-10-19 DIAGNOSIS — S7292XA Unspecified fracture of left femur, initial encounter for closed fracture: Secondary | ICD-10-CM | POA: Diagnosis not present

## 2023-10-19 DIAGNOSIS — Z8249 Family history of ischemic heart disease and other diseases of the circulatory system: Secondary | ICD-10-CM | POA: Diagnosis not present

## 2023-10-19 DIAGNOSIS — M810 Age-related osteoporosis without current pathological fracture: Secondary | ICD-10-CM | POA: Diagnosis not present

## 2023-10-19 DIAGNOSIS — Z66 Do not resuscitate: Secondary | ICD-10-CM | POA: Diagnosis not present

## 2023-10-19 DIAGNOSIS — R2689 Other abnormalities of gait and mobility: Secondary | ICD-10-CM | POA: Diagnosis not present

## 2023-10-19 DIAGNOSIS — K5903 Drug induced constipation: Secondary | ICD-10-CM | POA: Diagnosis not present

## 2023-10-19 DIAGNOSIS — W108XXA Fall (on) (from) other stairs and steps, initial encounter: Secondary | ICD-10-CM | POA: Diagnosis not present

## 2023-10-19 DIAGNOSIS — Z9181 History of falling: Secondary | ICD-10-CM | POA: Diagnosis not present

## 2023-10-19 DIAGNOSIS — S72352D Displaced comminuted fracture of shaft of left femur, subsequent encounter for closed fracture with routine healing: Secondary | ICD-10-CM | POA: Diagnosis not present

## 2023-10-19 DIAGNOSIS — W109XXA Fall (on) (from) unspecified stairs and steps, initial encounter: Secondary | ICD-10-CM | POA: Diagnosis present

## 2023-10-19 DIAGNOSIS — S72352A Displaced comminuted fracture of shaft of left femur, initial encounter for closed fracture: Secondary | ICD-10-CM | POA: Diagnosis present

## 2023-10-19 DIAGNOSIS — D62 Acute posthemorrhagic anemia: Secondary | ICD-10-CM | POA: Diagnosis not present

## 2023-10-19 DIAGNOSIS — S72302A Unspecified fracture of shaft of left femur, initial encounter for closed fracture: Secondary | ICD-10-CM | POA: Diagnosis not present

## 2023-10-19 DIAGNOSIS — R531 Weakness: Secondary | ICD-10-CM | POA: Diagnosis not present

## 2023-10-19 DIAGNOSIS — I10 Essential (primary) hypertension: Secondary | ICD-10-CM | POA: Diagnosis not present

## 2023-10-19 LAB — COMPREHENSIVE METABOLIC PANEL
ALT: 26 U/L (ref 0–44)
AST: 30 U/L (ref 15–41)
Albumin: 4.2 g/dL (ref 3.5–5.0)
Alkaline Phosphatase: 68 U/L (ref 38–126)
Anion gap: 8 (ref 5–15)
BUN: 20 mg/dL (ref 8–23)
CO2: 26 mmol/L (ref 22–32)
Calcium: 9 mg/dL (ref 8.9–10.3)
Chloride: 104 mmol/L (ref 98–111)
Creatinine, Ser: 0.81 mg/dL (ref 0.44–1.00)
GFR, Estimated: >60 mL/min (ref >60)
Glucose, Bld: 140 mg/dL — ABNORMAL HIGH (ref 70–99)
Potassium: 3.3 mmol/L — ABNORMAL LOW (ref 3.5–5.1)
Sodium: 138 mmol/L (ref 135–145)
Total Bilirubin: 0.7 mg/dL (ref 0.3–1.2)
Total Protein: 8.1 g/dL (ref 6.5–8.1)

## 2023-10-19 LAB — CBC WITH DIFFERENTIAL/PLATELET
Abs Immature Granulocytes: 0.05 10*3/uL (ref 0.00–0.07)
Basophils Absolute: 0.1 10*3/uL (ref 0.0–0.1)
Basophils Relative: 1 %
Eosinophils Absolute: 0.3 10*3/uL (ref 0.0–0.5)
Eosinophils Relative: 2 %
HCT: 40.7 % (ref 36.0–46.0)
Hemoglobin: 13.9 g/dL (ref 12.0–15.0)
Immature Granulocytes: 0 %
Lymphocytes Relative: 45 %
Lymphs Abs: 6 10*3/uL — ABNORMAL HIGH (ref 0.7–4.0)
MCH: 31.3 pg (ref 26.0–34.0)
MCHC: 34.2 g/dL (ref 30.0–36.0)
MCV: 91.7 fL (ref 80.0–100.0)
Monocytes Absolute: 0.9 10*3/uL (ref 0.1–1.0)
Monocytes Relative: 7 %
Neutro Abs: 6.1 10*3/uL (ref 1.7–7.7)
Neutrophils Relative %: 45 %
Platelets: 331 10*3/uL (ref 150–400)
RBC: 4.44 MIL/uL (ref 3.87–5.11)
RDW: 13.2 % (ref 11.5–15.5)
Smear Review: NORMAL
WBC: 13.3 10*3/uL — ABNORMAL HIGH (ref 4.0–10.5)
nRBC: 0 % (ref 0.0–0.2)

## 2023-10-19 MED ORDER — TRAZODONE HCL 50 MG PO TABS: 25.0000 mg | ORAL_TABLET | Freq: Every evening | ORAL | Status: DC | PRN

## 2023-10-19 MED ORDER — AMLODIPINE BESYLATE 5 MG PO TABS
5.0000 mg | ORAL_TABLET | Freq: Every day | ORAL | Status: DC
  Administered 2023-10-21 – 2023-10-23 (×3): 5 mg via ORAL
  Filled 2023-10-19 (×3): qty 1

## 2023-10-19 MED ORDER — CEFAZOLIN SODIUM-DEXTROSE 2-4 GM/100ML-% IV SOLN
2.0000 g | INTRAVENOUS | Status: AC
  Administered 2023-10-20: 2 g via INTRAVENOUS
  Filled 2023-10-19: qty 100

## 2023-10-19 MED ORDER — SODIUM CHLORIDE 0.9 % IV SOLN: INTRAVENOUS | Status: DC

## 2023-10-19 MED ORDER — ACETAMINOPHEN 325 MG PO TABS: 650.0000 mg | ORAL_TABLET | ORAL | Status: DC | PRN

## 2023-10-19 MED ORDER — ONDANSETRON HCL 4 MG/2ML IJ SOLN: 4.0000 mg | INTRAMUSCULAR | Status: DC | PRN

## 2023-10-19 MED ORDER — MAGNESIUM HYDROXIDE 400 MG/5ML PO SUSP
30.0000 mL | Freq: Every day | ORAL | Status: DC | PRN
Start: 2023-10-19 — End: 2023-10-20

## 2023-10-19 MED ORDER — OXYCODONE HCL 5 MG PO TABS
5.0000 mg | ORAL_TABLET | ORAL | Status: DC | PRN
  Administered 2023-10-20: 5 mg via ORAL
  Filled 2023-10-19: qty 1

## 2023-10-19 MED ORDER — TRANEXAMIC ACID-NACL 1000-0.7 MG/100ML-% IV SOLN
1000.0000 mg | Freq: Once | INTRAVENOUS | Status: AC
  Administered 2023-10-19: 1000 mg via INTRAVENOUS
  Filled 2023-10-19: qty 100

## 2023-10-19 MED ORDER — ADULT MULTIVITAMIN W/MINERALS CH
1.0000 | ORAL_TABLET | Freq: Every day | ORAL | Status: DC
  Administered 2023-10-21 – 2023-10-23 (×3): 1 via ORAL
  Filled 2023-10-19 (×3): qty 1

## 2023-10-19 MED ORDER — VITAMIN D 25 MCG (1000 UNIT) PO TABS
1000.0000 [IU] | ORAL_TABLET | Freq: Every day | ORAL | Status: DC
  Administered 2023-10-21 – 2023-10-23 (×3): 1000 [IU] via ORAL
  Filled 2023-10-19 (×3): qty 1

## 2023-10-19 MED ORDER — MORPHINE SULFATE (PF) 4 MG/ML IV SOLN
4.0000 mg | Freq: Once | INTRAVENOUS | Status: AC
  Administered 2023-10-19: 4 mg via INTRAVENOUS
  Filled 2023-10-19: qty 1

## 2023-10-19 MED ORDER — MORPHINE SULFATE (PF) 2 MG/ML IV SOLN
0.5000 mg | INTRAVENOUS | Status: DC | PRN
  Administered 2023-10-20 (×3): 0.5 mg via INTRAVENOUS
  Filled 2023-10-19 (×3): qty 1

## 2023-10-19 MED ORDER — ONDANSETRON HCL 4 MG/2ML IJ SOLN
4.0000 mg | Freq: Once | INTRAMUSCULAR | Status: AC
  Administered 2023-10-19: 4 mg via INTRAVENOUS
  Filled 2023-10-19: qty 2

## 2023-10-19 NOTE — ED Provider Notes (Signed)
[facility] Provider Note   Event Date/Time   First MD Initiated Contact with Patient 10/19/23 2134     (approximate) History  Fall and Leg Injury  HPI Lori Edwards is a 89 y.o. female with a past medical history of hypertension and hyperlipidemia who presents after a mechanical fall down marble staircase resulting in a deformity and significant pain to the left hip.  Patient has been unable to bear any weight on this left side and admits to 8/10 pain after administration of fentanyl by EMS.  This left leg is shortened and externally rotated on arrival ROS: Patient currently denies any vision changes, tinnitus, difficulty speaking, facial droop, sore throat, chest pain, shortness of breath, abdominal pain, nausea/vomiting/diarrhea, dysuria, or weakness/numbness/paresthesias in any extremity   Physical Exam  Triage Vital Signs: ED Triage Vitals  Encounter Vitals Group     BP      Systolic BP Percentile      Diastolic BP Percentile      Pulse      Resp      Temp      Temp src      SpO2      Weight      Height      Head Circumference      Peak Flow      Pain Score      Pain Loc      Pain Education      Exclude from Growth Chart    Most recent vital signs: Vitals:   10/19/23 2140  BP: (!) 173/78  Pulse: 94  Temp: 97.6 F (36.4 C)  SpO2: 95%   General: Awake, oriented x4. CV:  Good peripheral perfusion.  Resp:  Normal effort.  Abd:  No distention.  Other:  Elderly well-developed, well-nourished [place] Asian female resting comfortably in no acute distress ED Results / Procedures / Treatments  Labs (all labs ordered are listed, but only abnormal results are displayed) Labs Reviewed  COMPREHENSIVE METABOLIC PANEL - Abnormal; Notable for the following components:      Result Value   Potassium 3.3 (*)    Glucose, Bld 140 (*)    All other components within normal limits  CBC WITH DIFFERENTIAL/PLATELET - Abnormal; Notable for the following  components:   WBC 13.3 (*)    Lymphs Abs 6.0 (*)    All other components within normal limits   EKG ED ECG REPORT I, Merwyn Katos, the attending physician, personally viewed and interpreted this ECG. Date: 10/19/2023 EKG Time: 2232 Rate: 79 Rhythm: normal sinus rhythm QRS Axis: normal Intervals: normal ST/T Wave abnormalities: normal Narrative Interpretation: no evidence of acute ischemia RADIOLOGY ED MD interpretation: X-ray of the left hip and pelvis interpreted independently by me shows a proximal midshaft femur fracture with significant angulation -Agree with radiology assessment Official radiology report(s): No results found. PROCEDURES: Critical Care performed: No Procedures MEDICATIONS ORDERED IN ED: Medications  tranexamic acid (CYKLOKAPRON) IVPB 1,000 mg (has no administration in time range)  ceFAZolin (ANCEF) IVPB 2g/100 mL premix (has no administration in time range)  morphine (PF) 4 MG/ML injection 4 mg (4 mg Intravenous Given 10/19/23 2221)  ondansetron (ZOFRAN) injection 4 mg (4 mg Intravenous Given 10/19/23 2221)   IMPRESSION / MDM / ASSESSMENT AND PLAN / ED COURSE  I reviewed the triage vital signs and the nursing notes.  The patient is on the cardiac monitor to evaluate for evidence of arrhythmia and/or significant heart rate changes. Patient's presentation is most consistent with acute presentation with potential threat to life or bodily function. Patient is a 89 year old female that presents for left hip pain after a mechanical fall down stairs Workup: XR hip Findings: Left midshaft femur fracture without dislocation Consult: Orthopedic Surgery, hospitalist  Patient does not currently demonstrate complications of fracture such as compartment syndrome, arterial or nerve injury.  Interventions: analgesia Disposition: Admit   FINAL CLINICAL IMPRESSION(S) / ED DIAGNOSES   Final diagnoses:  Closed displaced comminuted  fracture of shaft of left femur, initial encounter (HCC)  Fall, initial encounter   Rx / DC Orders   ED Discharge Orders     None      Note:  This document was prepared using Dragon voice recognition software and may include unintentional dictation errors.   Merwyn Katos, MD 10/19/23 [phone]

## 2023-10-19 NOTE — Assessment & Plan Note (Addendum)
diet managed, not on medication

## 2023-10-19 NOTE — Assessment & Plan Note (Deleted)
-   This is secondary to mechanical fall. - The patient be admitted to a medical-surgical bed. - Pain management will be provided. - General Surgery consult will be obtained. - Dr. Allena Katz was notified and is aware about the patient. - The patient has no significant history for coronary artery disease, CHF, CVA, renal failure with creatinine more than 2 or diabetes mellitus on insulin.  She is considered at average risk for her age for perioperative cardiovascular per the revised cardiac risk index.  She has no current pulmonary issues.

## 2023-10-19 NOTE — H&P (Signed)
[place]   PATIENT NAME: Lori Edwards    MR#:  [id number]  DATE OF BIRTH:  [birth year]  DATE OF ADMISSION:  10/19/2023  PRIMARY CARE PHYSICIAN: Sharon Seller, NP   Patient is coming from: Home  REQUESTING/REFERRING PHYSICIAN: Donna Bernard, MD  CHIEF COMPLAINT:   Chief Complaint  Patient presents with   Fall   Leg Injury    HISTORY OF PRESENT ILLNESS:  Lori Edwards is a 89 y.o. female with medical history significant for hypertension and dyslipidemia, who presented to the emergency room with acute onset of mechanical fall down marble staircase resulting in a deformity and severe pain of the left thigh.  She was unable to bear any weight on the left side increased her pain 8/10 in severity after being given fentanyl by EMS.  Her left leg was shortened and externally rotated upon arrival to the emergency room.  She denied any presyncope or syncope.  No paresthesias or focal muscle weakness.  No chest pain or palpitations.  No headache or dizziness or blurred vision.  No fever or chills.  No dysuria, oliguria or hematuria or flank pain.  She denied any cough or wheezing or dyspnea.  No bleeding diathesis.  She is not on any blood thinners.  ED Course: When she came to the ER, BP was 173/78 with otherwise normal vital signs.  Labs revealed hypokalemia of 3.3 with otherwise unremarkable CMP.  CBC showed leukocytosis 13.3 with no other acute abnormality. EKG as reviewed by me : Pending Imaging: 2 view left femur x-ray showed left midshaft displaced femur fracture. Portable chest x-ray showed no acute cardiopulmonary disease.  The patient was given 4 mg of IV morphine sulfate, 4 mg of IV Zofran and 1 g of tranexamic acid after phone consultation with Dr. Allena Katz who is aware about the patient.  She will be admitted to a medical-surgical bed for further evaluation and management. PAST MEDICAL HISTORY:   Past Medical History:  Diagnosis Date   Hyperlipidemia     Hypertension     PAST SURGICAL HISTORY:   Past Surgical History:  Procedure Laterality Date   OPEN REDUCTION INTERNAL FIXATION (ORIF) DISTAL RADIAL FRACTURE Right 07/31/2017   Procedure: OPEN REDUCTION INTERNAL FIXATION (ORIF) DISTAL RADIAL FRACTURE;  Surgeon: Deeann Saint, MD;  Location: [facility] ORS;  Service: Orthopedics;  Laterality: Right;    SOCIAL HISTORY:   Social History   Tobacco Use   Smoking status: Never   Smokeless tobacco: Never  Substance Use Topics   Alcohol use: No    FAMILY HISTORY:   Family History  Problem Relation Age of Onset   Stroke Other        Parent   Stroke Mother    Hypertension Mother    Hypertension Father     DRUG ALLERGIES:  No Known Allergies  REVIEW OF SYSTEMS:   ROS As per history of present illness. All pertinent systems were reviewed above. Constitutional, HEENT, cardiovascular, respiratory, GI, GU, musculoskeletal, neuro, psychiatric, endocrine, integumentary and hematologic systems were reviewed and are otherwise negative/unremarkable except for positive findings mentioned above in the HPI.   MEDICATIONS AT HOME:   Prior to Admission medications   Medication Sig Start Date End Date Taking? Authorizing Provider  amLODipine (NORVASC) 5 MG tablet Take 1 tablet (5 mg total) by mouth daily. 03/31/23   Sharon Seller, NP  cholecalciferol (VITAMIN D3) 25 MCG (1000 UT) tablet Take 1,000 Units by mouth daily.    [provider]  Multiple Vitamin (MULTIVITAMIN WITH MINERALS) TABS tablet Take 1 tablet by mouth daily.    [provider]  zoledronic acid (RECLAST) 5 MG/100ML SOLN injection Inject 5 mg into the vein as directed. Once yearly    [provider]      VITAL SIGNS:  Blood pressure (!) 173/78, pulse 94, temperature 97.6 F (36.4 C), temperature source Oral, SpO2 95%.  PHYSICAL EXAMINATION:  Physical Exam  GENERAL:  89 y.o.-year-old patient lying in the bed with no acute distress.  EYES:  Pupils equal, round, reactive to light and accommodation. No scleral icterus. Extraocular muscles intact.  HEENT: Head atraumatic, normocephalic. Oropharynx and nasopharynx clear.  NECK:  Supple, no jugular venous distention. No thyroid enlargement, no tenderness.  LUNGS: Normal breath sounds bilaterally, no wheezing, rales,rhonchi or crepitation. No use of accessory muscles of respiration.  CARDIOVASCULAR: Regular rate and rhythm, S1, S2 normal. No murmurs, rubs, or gallops.  ABDOMEN: Soft, nondistended, nontender. Bowel sounds present. No organomegaly or mass.  EXTREMITIES: No pedal edema, cyanosis, or clubbing.  NEUROLOGIC: Cranial nerves II through XII are intact. Muscle strength 5/5 in all extremities. Sensation intact. Gait not checked.  PSYCHIATRIC: The patient is alert and oriented x 3.  Normal affect and good eye contact. SKIN: No obvious rash, lesion, or ulcer.  Musculoskeletal: Shortened left lower extremity with exquisite mid thigh tenderness and swelling. LABORATORY PANEL:   CBC Recent Labs  Lab 10/19/23 2149  WBC 13.3*  HGB 13.9  HCT 40.7  PLT 331   ------------------------------------------------------------------------------------------------------------------  Chemistries  Recent Labs  Lab 10/19/23 2149  NA 138  K 3.3*  CL 104  CO2 26  GLUCOSE 140*  BUN 20  CREATININE 0.81  CALCIUM 9.0  AST 30  ALT 26  ALKPHOS 68  BILITOT 0.7   ------------------------------------------------------------------------------------------------------------------  Cardiac Enzymes No results for input(s): "TROPONINI" in the last 168 hours. ------------------------------------------------------------------------------------------------------------------  RADIOLOGY:  DG Chest 1 View  Result Date: 10/19/2023 CLINICAL DATA:  Fall with hip fracture EXAM: CHEST  1 VIEW COMPARISON:  07/31/2017 FINDINGS: No acute airspace disease or pleural effusion. Stable cardiomediastinal  silhouette with aortic atherosclerosis. No pneumothorax. IMPRESSION: No active disease. Electronically Signed   By: Jasmine Pang M.D.   On: 10/19/2023 23:12   DG Hip Unilat W or Wo Pelvis 2-3 Views Left  Result Date: 10/19/2023 CLINICAL DATA:  Fall with hip deformity EXAM: DG HIP (WITH OR WITHOUT PELVIS) 2-3V LEFT COMPARISON:  None Available. FINDINGS: SI joints are non widened. Pubic symphysis appears intact. Acute comminuted fracture involving the subtrochanteric femoral diaphysis with moderate apex lateral angulation and about 1/2 shaft diameter central and greater than 1 shaft diameter posterior displacement of distal fracture fragment. Left femoral head projects in joint. Probable old fracture deformities of the left superior and inferior pubic rami. IMPRESSION: Acute comminuted displaced and angulated subtrochanteric left femoral diaphyseal fracture. Electronically Signed   By: Jasmine Pang M.D.   On: 10/19/2023 23:11      IMPRESSION AND PLAN:  Assessment and Plan: * Displaced comminuted fracture of shaft of left femur, initial encounter for closed fracture (HCC) - This is secondary to mechanical fall. - The patient be admitted to a medical-surgical bed. - Pain management will be provided. - General Surgery consult will be obtained. - Dr. Allena Katz was notified and is aware about the patient. - The patient has no significant history for coronary artery disease, CHF, CVA, renal failure with creatinine more than 2 or diabetes mellitus on insulin.  She is considered at average risk for her age for perioperative cardiovascular per the revised cardiac risk index.  She has no current pulmonary issues.  Essential hypertension - We will continue her amlodipine.  Dyslipidemia - This is apparently diet managed.    DVT prophylaxis: SCDs.  Medical prophylaxis is postponed to postoperative period. Advanced Care Planning:  Code Status: The is DNR and DNI. Family Communication:  The plan of care  was discussed in details with the patient (and family). I answered all questions. The patient agreed to proceed with the above mentioned plan. Further management will depend upon hospital course. Disposition Plan: Back to previous home environment Consults called: Orthopedic consult. All the records are reviewed and case discussed with ED provider.  Status is: Inpatient  At the time of the admission, it appears that the appropriate admission status for this patient is inpatient.  This is judged to be reasonable and necessary in order to provide the required intensity of service to ensure the patient's safety given the presenting symptoms, physical exam findings and initial radiographic and laboratory data in the context of comorbid conditions.  The patient requires inpatient status due to high intensity of service, high risk of further deterioration and high frequency of surveillance required.  I certify that at the time of admission, it is my clinical judgment that the patient will require inpatient hospital care extending more than 2 midnights.                            Dispo: The patient is from: Home              Anticipated d/c is to: Home              Patient currently is not medically stable to d/c.              Difficult to place patient: No  Hannah Beat M.D on 10/20/2023 at 12:01 AM  [facility]   From 7 PM-7 AM, contact night-coverage [email or web address]  CC: Primary care physician; Sharon Seller, NP

## 2023-10-19 NOTE — Assessment & Plan Note (Addendum)
On home amlodipine

## 2023-10-19 NOTE — Progress Notes (Signed)
Full consult note and discussion with patient to follow tomorrow.  Called by ED staff. Imaging reviewed.  - Plan for surgery tomorrow, late morning vs early afternoon.  - NPO after midnight - Hold anticoagulation - Admit to Hospitalist team.

## 2023-10-19 NOTE — ED Notes (Signed)
Patient transported to X-ray 

## 2023-10-19 NOTE — ED Triage Notes (Signed)
Patient presents to ED for fall down marble steps. Denies LOC, head injury or thinners. Reports 8/10 pain after admin of fentanyl by [facility].  L hip shortened and externally rotated on arrival.

## 2023-10-20 ENCOUNTER — Inpatient Hospital Stay: Payer: Medicare Other | Admitting: Certified Registered"

## 2023-10-20 ENCOUNTER — Encounter: Payer: Self-pay | Admitting: Family Medicine

## 2023-10-20 ENCOUNTER — Inpatient Hospital Stay: Payer: Medicare Other

## 2023-10-20 ENCOUNTER — Encounter
Admission: EM | Disposition: A | Discharge status: Skilled Nursing Facility | Payer: Self-pay | Source: Skilled Nursing Facility | Attending: Internal Medicine

## 2023-10-20 ENCOUNTER — Other Ambulatory Visit: Payer: Self-pay

## 2023-10-20 DIAGNOSIS — S72352A Displaced comminuted fracture of shaft of left femur, initial encounter for closed fracture: Secondary | ICD-10-CM | POA: Diagnosis present

## 2023-10-20 HISTORY — PX: INTRAMEDULLARY (IM) NAIL INTERTROCHANTERIC: SHX5875

## 2023-10-20 LAB — BASIC METABOLIC PANEL
Anion gap: 11 (ref 5–15)
BUN: 16 mg/dL (ref 8–23)
CO2: 24 mmol/L (ref 22–32)
Calcium: 8.9 mg/dL (ref 8.9–10.3)
Chloride: 102 mmol/L (ref 98–111)
Creatinine, Ser: 0.75 mg/dL (ref 0.44–1.00)
GFR, Estimated: >60 mL/min (ref >60)
Glucose, Bld: 171 mg/dL — ABNORMAL HIGH (ref 70–99)
Potassium: 3.6 mmol/L (ref 3.5–5.1)
Sodium: 137 mmol/L (ref 135–145)

## 2023-10-20 LAB — CBC
HCT: 39.7 % (ref 36.0–46.0)
Hemoglobin: 13.6 g/dL (ref 12.0–15.0)
MCH: 31.3 pg (ref 26.0–34.0)
MCHC: 34.3 g/dL (ref 30.0–36.0)
MCV: 91.5 fL (ref 80.0–100.0)
Platelets: 310 10*3/uL (ref 150–400)
RBC: 4.34 MIL/uL (ref 3.87–5.11)
RDW: 13.2 % (ref 11.5–15.5)
WBC: 18.1 10*3/uL — ABNORMAL HIGH (ref 4.0–10.5)
nRBC: 0 % (ref 0.0–0.2)

## 2023-10-20 LAB — SURGICAL PCR SCREEN
MRSA, PCR: POSITIVE — AB
Staphylococcus aureus: POSITIVE — AB

## 2023-10-20 LAB — TYPE AND SCREEN
ABO/RH(D): B POS
Antibody Screen: NEGATIVE

## 2023-10-20 LAB — ABO/RH: ABO/RH(D): B POS

## 2023-10-20 SURGERY — FIXATION, FRACTURE, INTERTROCHANTERIC, WITH INTRAMEDULLARY ROD
Anesthesia: Spinal | Site: Hip | Laterality: Left

## 2023-10-20 MED ORDER — ROCURONIUM BROMIDE 100 MG/10ML IV SOLN
INTRAVENOUS | Status: DC | PRN
  Administered 2023-10-20: 20 mg via INTRAVENOUS
  Administered 2023-10-20: 50 mg via INTRAVENOUS

## 2023-10-20 MED ORDER — PROPOFOL 10 MG/ML IV BOLUS
INTRAVENOUS | Status: DC | PRN
  Administered 2023-10-20: 20 ug/kg/min via INTRAVENOUS
  Administered 2023-10-20: 100 mg via INTRAVENOUS

## 2023-10-20 MED ORDER — KETOROLAC TROMETHAMINE 15 MG/ML IJ SOLN
7.5000 mg | Freq: Four times a day (QID) | INTRAMUSCULAR | Status: AC
  Administered 2023-10-20 – 2023-10-21 (×4): 7.5 mg via INTRAVENOUS
  Filled 2023-10-20 (×4): qty 1

## 2023-10-20 MED ORDER — OXYCODONE HCL 5 MG PO TABS: 5.0000 mg | ORAL_TABLET | Freq: Once | ORAL | Status: DC | PRN

## 2023-10-20 MED ORDER — METOCLOPRAMIDE HCL 5 MG/ML IJ SOLN
5.0000 mg | Freq: Three times a day (TID) | INTRAMUSCULAR | Status: DC | PRN
  Administered 2023-10-20: 5 mg via INTRAVENOUS
  Filled 2023-10-20: qty 2

## 2023-10-20 MED ORDER — MUPIROCIN 2 % EX OINT: 1.0000 | TOPICAL_OINTMENT | Freq: Two times a day (BID) | CUTANEOUS | 0 refills | Status: DC

## 2023-10-20 MED ORDER — SUGAMMADEX SODIUM 200 MG/2ML IV SOLN
INTRAVENOUS | Status: DC | PRN
  Administered 2023-10-20: 150 mg via INTRAVENOUS

## 2023-10-20 MED ORDER — HYDROMORPHONE HCL 1 MG/ML IJ SOLN
0.2000 mg | INTRAMUSCULAR | Status: DC | PRN
Start: 2023-10-20 — End: 2023-10-23

## 2023-10-20 MED ORDER — CEFAZOLIN SODIUM-DEXTROSE 2-4 GM/100ML-% IV SOLN
INTRAVENOUS | Status: AC
  Filled 2023-10-20: qty 100

## 2023-10-20 MED ORDER — BUPIVACAINE HCL (PF) 0.5 % IJ SOLN
INTRAMUSCULAR | Status: AC
  Filled 2023-10-20: qty 30

## 2023-10-20 MED ORDER — METHOCARBAMOL 500 MG PO TABS
500.0000 mg | ORAL_TABLET | Freq: Four times a day (QID) | ORAL | Status: DC | PRN
  Administered 2023-10-22: 500 mg via ORAL
  Filled 2023-10-20: qty 1

## 2023-10-20 MED ORDER — ONDANSETRON HCL 4 MG PO TABS: 4.0000 mg | ORAL_TABLET | Freq: Four times a day (QID) | ORAL | Status: DC | PRN

## 2023-10-20 MED ORDER — CHLORHEXIDINE GLUCONATE 4 % EX SOLN: 1.0000 | CUTANEOUS | 1 refills | Status: DC

## 2023-10-20 MED ORDER — FENTANYL CITRATE (PF) 100 MCG/2ML IJ SOLN
INTRAMUSCULAR | Status: AC
  Filled 2023-10-20: qty 2

## 2023-10-20 MED ORDER — DEXAMETHASONE SODIUM PHOSPHATE 10 MG/ML IJ SOLN
INTRAMUSCULAR | Status: AC
  Filled 2023-10-20: qty 1

## 2023-10-20 MED ORDER — SENNOSIDES-DOCUSATE SODIUM 8.6-50 MG PO TABS
1.0000 | ORAL_TABLET | Freq: Every evening | ORAL | Status: DC | PRN
  Administered 2023-10-21: 1 via ORAL
  Filled 2023-10-20: qty 1

## 2023-10-20 MED ORDER — FENTANYL CITRATE (PF) 100 MCG/2ML IJ SOLN
INTRAMUSCULAR | Status: DC | PRN
  Administered 2023-10-20 (×2): 25 ug via INTRAVENOUS

## 2023-10-20 MED ORDER — OXYCODONE HCL 5 MG PO TABS
5.0000 mg | ORAL_TABLET | ORAL | Status: DC | PRN
Start: 2023-10-20 — End: 2023-10-23

## 2023-10-20 MED ORDER — ONDANSETRON HCL 4 MG/2ML IJ SOLN
4.0000 mg | Freq: Once | INTRAMUSCULAR | Status: AC | PRN
  Administered 2023-10-20: 4 mg via INTRAVENOUS

## 2023-10-20 MED ORDER — LIDOCAINE HCL (CARDIAC) PF 100 MG/5ML IV SOSY
PREFILLED_SYRINGE | INTRAVENOUS | Status: DC | PRN
  Administered 2023-10-20: 100 mg via INTRAVENOUS

## 2023-10-20 MED ORDER — ACETAMINOPHEN 325 MG PO TABS
325.0000 mg | ORAL_TABLET | Freq: Four times a day (QID) | ORAL | Status: DC | PRN
Start: 2023-10-21 — End: 2023-10-23

## 2023-10-20 MED ORDER — 0.9 % SODIUM CHLORIDE (POUR BTL) OPTIME
TOPICAL | Status: DC | PRN
  Administered 2023-10-20: 500 mL

## 2023-10-20 MED ORDER — FLEET ENEMA RE ENEM
1.0000 | ENEMA | Freq: Once | RECTAL | Status: DC | PRN
  Filled 2023-10-20: qty 1

## 2023-10-20 MED ORDER — DROPERIDOL 2.5 MG/ML IJ SOLN
0.6250 mg | Freq: Once | INTRAMUSCULAR | Status: AC
  Administered 2023-10-20: 0.625 mg via INTRAVENOUS

## 2023-10-20 MED ORDER — ONDANSETRON HCL 4 MG/2ML IJ SOLN
4.0000 mg | Freq: Four times a day (QID) | INTRAMUSCULAR | Status: DC | PRN
  Administered 2023-10-21: 4 mg via INTRAVENOUS
  Filled 2023-10-20: qty 2

## 2023-10-20 MED ORDER — BUPIVACAINE LIPOSOME 1.3 % IJ SUSP
INTRAMUSCULAR | Status: DC | PRN
  Administered 2023-10-20: 50 mL

## 2023-10-20 MED ORDER — DROPERIDOL 2.5 MG/ML IJ SOLN
INTRAMUSCULAR | Status: AC
  Filled 2023-10-20: qty 2

## 2023-10-20 MED ORDER — ENOXAPARIN SODIUM 40 MG/0.4ML IJ SOSY
40.0000 mg | PREFILLED_SYRINGE | INTRAMUSCULAR | Status: DC
  Administered 2023-10-21 – 2023-10-23 (×3): 40 mg via SUBCUTANEOUS
  Filled 2023-10-20 (×3): qty 0.4

## 2023-10-20 MED ORDER — ONDANSETRON HCL 4 MG/2ML IJ SOLN
INTRAMUSCULAR | Status: AC
  Filled 2023-10-20: qty 2

## 2023-10-20 MED ORDER — FENTANYL CITRATE (PF) 100 MCG/2ML IJ SOLN: 25.0000 ug | INTRAMUSCULAR | Status: DC | PRN

## 2023-10-20 MED ORDER — METHOCARBAMOL 1000 MG/10ML IJ SOLN: 500.0000 mg | Freq: Four times a day (QID) | INTRAMUSCULAR | Status: DC | PRN

## 2023-10-20 MED ORDER — PHENYLEPHRINE HCL-NACL 20-0.9 MG/250ML-% IV SOLN
INTRAVENOUS | Status: DC | PRN
  Administered 2023-10-20: 30 ug/min via INTRAVENOUS

## 2023-10-20 MED ORDER — BUPIVACAINE LIPOSOME 1.3 % IJ SUSP
INTRAMUSCULAR | Status: AC
  Filled 2023-10-20: qty 20

## 2023-10-20 MED ORDER — PHENYLEPHRINE HCL-NACL 20-0.9 MG/250ML-% IV SOLN
INTRAVENOUS | Status: AC
  Filled 2023-10-20: qty 250

## 2023-10-20 MED ORDER — ACETAMINOPHEN 500 MG PO TABS
1000.0000 mg | ORAL_TABLET | Freq: Three times a day (TID) | ORAL | Status: DC
  Administered 2023-10-20 – 2023-10-23 (×9): 1000 mg via ORAL
  Filled 2023-10-20 (×9): qty 2

## 2023-10-20 MED ORDER — ACETAMINOPHEN 10 MG/ML IV SOLN
INTRAVENOUS | Status: AC
  Filled 2023-10-20: qty 100

## 2023-10-20 MED ORDER — DEXAMETHASONE SODIUM PHOSPHATE 10 MG/ML IJ SOLN
INTRAMUSCULAR | Status: DC | PRN
  Administered 2023-10-20: 10 mg via INTRAVENOUS

## 2023-10-20 MED ORDER — OXYCODONE HCL 5 MG/5ML PO SOLN: 5.0000 mg | Freq: Once | ORAL | Status: DC | PRN

## 2023-10-20 MED ORDER — ONDANSETRON HCL 4 MG/2ML IJ SOLN
INTRAMUSCULAR | Status: DC | PRN
  Administered 2023-10-20: 4 mg via INTRAVENOUS

## 2023-10-20 MED ORDER — METOCLOPRAMIDE HCL 5 MG PO TABS: 5.0000 mg | ORAL_TABLET | Freq: Three times a day (TID) | ORAL | Status: DC | PRN

## 2023-10-20 MED ORDER — OXYCODONE HCL 5 MG PO TABS
2.5000 mg | ORAL_TABLET | ORAL | Status: DC | PRN
Start: 2023-10-20 — End: 2023-10-23
  Filled 2023-10-20: qty 1

## 2023-10-20 MED ORDER — BISACODYL 10 MG RE SUPP: 10.0000 mg | Freq: Every day | RECTAL | Status: DC | PRN

## 2023-10-20 MED ORDER — CHLORHEXIDINE GLUCONATE CLOTH 2 % EX PADS: 6.0000 | MEDICATED_PAD | Freq: Every day | CUTANEOUS | Status: DC

## 2023-10-20 MED ORDER — ACETAMINOPHEN 10 MG/ML IV SOLN
INTRAVENOUS | Status: DC | PRN
  Administered 2023-10-20: 1000 mg via INTRAVENOUS

## 2023-10-20 MED ORDER — CEFAZOLIN SODIUM-DEXTROSE 2-4 GM/100ML-% IV SOLN
2.0000 g | Freq: Four times a day (QID) | INTRAVENOUS | Status: AC
Start: 2023-10-20 — End: 2023-10-22
  Administered 2023-10-20 – 2023-10-21 (×3): 2 g via INTRAVENOUS
  Filled 2023-10-20 (×3): qty 100

## 2023-10-20 MED ORDER — ROCURONIUM BROMIDE 10 MG/ML (PF) SYRINGE
PREFILLED_SYRINGE | INTRAVENOUS | Status: AC
  Filled 2023-10-20: qty 10

## 2023-10-20 MED ORDER — PROPOFOL 10 MG/ML IV BOLUS
INTRAVENOUS | Status: AC
  Filled 2023-10-20: qty 20

## 2023-10-20 MED ORDER — MUPIROCIN 2 % EX OINT
1.0000 | TOPICAL_OINTMENT | Freq: Two times a day (BID) | CUTANEOUS | Status: DC
  Administered 2023-10-20 – 2023-10-23 (×5): 1 via TOPICAL
  Filled 2023-10-20: qty 22

## 2023-10-20 MED ORDER — DOCUSATE SODIUM 100 MG PO CAPS
100.0000 mg | ORAL_CAPSULE | Freq: Two times a day (BID) | ORAL | Status: DC
  Administered 2023-10-20 – 2023-10-23 (×6): 100 mg via ORAL
  Filled 2023-10-20 (×6): qty 1

## 2023-10-20 MED ORDER — LIDOCAINE HCL (PF) 2 % IJ SOLN
INTRAMUSCULAR | Status: AC
  Filled 2023-10-20: qty 5

## 2023-10-20 SURGICAL SUPPLY — 45 items
APL PRP STRL LF DISP 70% ISPRP (MISCELLANEOUS) ×1
BIT DRILL INTERTAN LAG SCREW (BIT) IMPLANT
BIT DRILL SHORT 4.0 (BIT) IMPLANT
BLADE SURG 15 STRL LF DISP TIS (BLADE) ×1 IMPLANT
BLADE SURG 15 STRL SS (BLADE) ×1
CHLORAPREP W/TINT 26 (MISCELLANEOUS) ×1 IMPLANT
DRAPE SHEET LG 3/4 BI-LAMINATE (DRAPES) ×1 IMPLANT
DRAPE SURG 17X11 SM STRL (DRAPES) ×2 IMPLANT
DRAPE U-SHAPE 47X51 STRL (DRAPES) ×2 IMPLANT
DRILL BIT SHORT 4.0 (BIT) ×2
DRSG OPSITE POSTOP 3X4 (GAUZE/BANDAGES/DRESSINGS) ×3 IMPLANT
ELECT REM PT RETURN 9FT ADLT (ELECTROSURGICAL) ×1
ELECTRODE REM PT RTRN 9FT ADLT (ELECTROSURGICAL) ×1 IMPLANT
GAUZE XEROFORM 4X4 STRL (GAUZE/BANDAGES/DRESSINGS) ×1 IMPLANT
GLOVE BIOGEL PI IND STRL 8 (GLOVE) ×1 IMPLANT
GLOVE SURG SYN 7.5 E (GLOVE) ×1 IMPLANT
GLOVE SURG SYN 7.5 PF PI (GLOVE) ×1 IMPLANT
GOWN STRL REUS W/ TWL LRG LVL3 (GOWN DISPOSABLE) ×1 IMPLANT
GOWN STRL REUS W/ TWL XL LVL3 (GOWN DISPOSABLE) ×1 IMPLANT
GOWN STRL REUS W/TWL LRG LVL3 (GOWN DISPOSABLE) ×1
GOWN STRL REUS W/TWL XL LVL3 (GOWN DISPOSABLE) ×1
GUIDE PIN 3.2X343 (PIN) ×3
GUIDE PIN 3.2X343MM (PIN) ×3
GUIDE ROD 3.0 (MISCELLANEOUS) ×1
KIT PATIENT CARE HANA TABLE (KITS) ×1 IMPLANT
KIT TURNOVER CYSTO (KITS) ×1 IMPLANT
MANIFOLD NEPTUNE II (INSTRUMENTS) ×1 IMPLANT
MAT ABSORB FLUID 56X50 GRAY (MISCELLANEOUS) ×2 IMPLANT
NAIL TRIGEN INTERTAN 10S 340MM (Orthopedic Implant) IMPLANT
NDL HYPO 22X1.5 SAFETY MO (MISCELLANEOUS) ×1 IMPLANT
NEEDLE HYPO 22X1.5 SAFETY MO (MISCELLANEOUS) ×1 IMPLANT
NS IRRIG 500ML POUR BTL (IV SOLUTION) ×1 IMPLANT
PACK HIP COMPR (MISCELLANEOUS) ×1 IMPLANT
PIN GUIDE 3.2X343MM (PIN) IMPLANT
ROD GUIDE 3.0 (MISCELLANEOUS) IMPLANT
SCREW LAG COMPR KIT 85/80 (Screw) IMPLANT
SCREW TRIGEN LOW PROF 5.0X32.5 (Screw) IMPLANT
SCREW TRIGEN LOW PROF 5.0X40 (Screw) IMPLANT
STAPLER SKIN PROX 35W (STAPLE) ×1 IMPLANT
SUT VIC AB 2-0 CT2 27 (SUTURE) ×1 IMPLANT
SYR 10ML LL (SYRINGE) ×1 IMPLANT
SYR 30ML LL (SYRINGE) ×1 IMPLANT
TAPE CLOTH 3X10 WHT NS LF (GAUZE/BANDAGES/DRESSINGS) ×2 IMPLANT
TRAP FLUID SMOKE EVACUATOR (MISCELLANEOUS) ×1 IMPLANT
WATER STERILE IRR 500ML POUR (IV SOLUTION) ×1 IMPLANT

## 2023-10-20 NOTE — Op Note (Addendum)
DATE OF SURGERY: 10/20/2023  PREOPERATIVE DIAGNOSIS: Left atypical subtrochanteric hip fracture  POSTOPERATIVE DIAGNOSIS: Left atypical subtrochanteric hip fracture  PROCEDURE: Intramedullary nailing of Left femur with cephalomedullary device  SURGEON: Rosealee Albee, MD  ANESTHESIA: Gen  EBL: 200 cc  IVF: per anesthesia record  COMPONENTS:  [employer] Trigen Intertan Long Nail: 10x369mm; 85mm lag screw with 80mm compression screw; 5x32.68mm  and 5x16mm distal cortical interlocking screws  INDICATIONS: Lori Edwards is a 89 y.o. female who sustained an atypical subtrochanteric fracture after a fall.  This fracture pattern was likely due to bisphosphonate use.  Risks and benefits of intramedullary nailing were explained to the patient and/or family . Risks include but are not limited to bleeding, infection, injury to tissues, nerves, vessels, nonunion/malunion, hardware failure, limb length discrepancy/hip rotation mismatch and risks of anesthesia. The patient and/or family understand these risks, have completed an informed consent, and wish to proceed.   PROCEDURE:  The patient was brought into the operating room. After administering anesthesia, the patient was placed in the supine position on the Hana table. The uninjured leg was placed in an extended position while the injured lower extremity was placed in longitudinal traction. The fracture was was unable to be adequately reduced. The lateral aspect of the hip and thigh were prepped with ChloraPrep solution before being draped sterilely. Preoperative IV antibiotics were administered. A timeout was performed to verify the appropriate surgical site, patient, and procedure.    An approximately 8 cm incision was made in line with the fracture site and proximal shaft fragment.  IT band was split in line with the incision.  Vastus lateralis was dissected bluntly.  A large lobster clamp was passed around the femoral shaft proximal to the  fracture site.  The proximal fragment was then internally rotated and adducted.  Appropriate position of the greater trochanter was verified on the AP view.  Next, an approximately 6 cm incision was made about 4 fingerbreadths above the tip of the greater trochanter. The incision was carried down through the subcutaneous tissues to expose the gluteal fascia. This was split the length of the incision, providing access to the tip of the trochanter. Under fluoroscopic guidance, a guidewire was drilled through the tip of the trochanter into the proximal metaphysis to the level of the lesser trochanter.  Care was made taken to ensure that the entry point was medial to the tip of the greater trochanter on the AP view and slightly posterior and in line with the femoral canal on the lateral.  The guidewire was was overreamed with the opening reamer to the level of the lesser trochanter.  A Hohmann retractor was passed under the distal fracture fragment at the fracture site, and this was used to elevate the distal fragment.  Using combination of the Hohmann retractor and the lobster-claw clamp on the proximal fragment, a guidewire was able to be passed down through the femoral canal to the supracondylar region. The fracture was reduced by slightly externally rotating the lobster-claw and elevating the distal fragment with the [facility]. While holding the fracture in a reduced position, the guidewire was overreamed sequentially using the flexible reamers. The nail was selected and advanced to the appropriate depth as verified fluoroscopically.    The guide system for the lag screw was positioned and advanced through the previously made incision over the lateral aspect of the proximal femur. The guidewire was drilled up through the femoral nail and into the femoral neck to rest  within 5 mm of subchondral bone. After verifying its position in the femoral neck and head in both AP and lateral projections, the guidewire  was measured and appropriate sized lag screw was selected.  The channel for the compression screw was drilled and antirotation bar was placed.  Lag screw was drilled and placed in appropriate position.  Compression screw was then placed.  There was no need for further compression. The set screw was locked in place. Again, the adequacy of hardware position and fracture reduction was verified fluoroscopically in AP and lateral projections.  At this point, the Hohmann and lobster-claw clamp were removed and the fracture was noted to remain in a well reduced position.  Traction was slowly released.   Attention was directed distally. Using the "perfect circle" technique, the leg and fluoroscopy machine were positioned appropriately. A 2cm stab incision was made over the skin and IT band at the appropriate point before the drill bit was advanced through the cortex and across the static hole of the nail.  This process was repeated for another screw in the dynamic hole.  Appropriate screw lengths were determined with a measuring guide. A distal interlocking screws were placed. Again, the adequacy of screw positions were verified fluoroscopically in AP and lateral projections.   The wounds were irrigated thoroughly with sterile saline solution. Local anesthetic was injected into the wounds. Deep fascia/IT band was closed with 0-Vicryl. The subcutaneous tissues were closed using 2-0 Vicryl interrupted sutures. The skin was closed using staples. Sterile occlusive dressings were applied to all wounds. The patient was then transferred to the recovery room in satisfactory condition.   This case had additional complexity compared to standard cephalomedullary nailing due to the fracture pattern (atypical subtrochanteric) leading to more instability. Open reduction had to be performed. This required a larger incision with more dissection. This surgery took ~45 minutes longer than usual cephalomedullary nailing due to the  fracture pattern.   POSTOPERATIVE PLAN: The patient will be WBAT on the operative extremity. Lovenox 40mg /day x 4 weeks to start on POD#1. Perioperative IV antibiotics x 24 hours. PT/OT on POD#1.

## 2023-10-20 NOTE — Consult Note (Signed)
ORTHOPAEDIC CONSULTATION  REQUESTING PHYSICIAN: Pennie Banter, DO  Chief Complaint:   L hip pain  History of Present Illness: Lori Edwards is a 89 y.o. female who had a fall down stairs yesterday. She was at a show with a group from [facility], where she lives in the independent living community. She ambulates unassisted at baseline. The patient noted immediate hip pain and inability to ambulate.  The patient ambulates with a walker at baseline. Pain is worse with any sort of movement.  X-rays in the emergency department show a left subtrochanteric hip fracture.  PMHx significant for HTN, HLD. She does take anti-osteoporosis medications.   Past Medical History:  Diagnosis Date   Hyperlipidemia    Hypertension    Past Surgical History:  Procedure Laterality Date   OPEN REDUCTION INTERNAL FIXATION (ORIF) DISTAL RADIAL FRACTURE Right 07/31/2017   Procedure: OPEN REDUCTION INTERNAL FIXATION (ORIF) DISTAL RADIAL FRACTURE;  Surgeon: Deeann Saint, MD;  Location: [facility] ORS;  Service: Orthopedics;  Laterality: Right;   Social History   Socioeconomic History   Marital status: Widowed    Spouse name: Not on file   Number of children: Not on file   Years of education: Not on file   Highest education level: Not on file  Occupational History   Not on file  Tobacco Use   Smoking status: Never   Smokeless tobacco: Never  Vaping Use   Vaping status: Never Used  Substance and Sexual Activity   Alcohol use: No   Drug use: No   Sexual activity: Not on file  Other Topics Concern   Not on file  Social History Narrative   Not on file   Social Determinants of Health   Financial Resource Strain: Low Risk  (07/12/2018)   Overall Financial Resource Strain (CARDIA)    Difficulty of Paying Living Expenses: Not hard at all  Food Insecurity: No Food Insecurity (10/20/2023)   Hunger Vital Sign    Worried About Running Out of  Food in the Last Year: Never true    Ran Out of Food in the Last Year: Never true  Transportation Needs: No Transportation Needs (10/20/2023)   PRAPARE - Administrator, Civil Service (Medical): No    Lack of Transportation (Non-Medical): No  Physical Activity: Unknown (09/13/2019)   Exercise Vital Sign    Days of Exercise per Week: Not on file    Minutes of Exercise per Session: 30 min  Stress: No Stress Concern Present (07/12/2018)   [employer] of Occupational Health - Occupational Stress Questionnaire    Feeling of Stress : Not at all  Social Connections: Not on file   Family History  Problem Relation Age of Onset   Stroke Other        Parent   Stroke Mother    Hypertension Mother    Hypertension Father    No Known Allergies Prior to Admission medications   Medication Sig Start Date End Date Taking? Authorizing Provider  amLODipine (NORVASC) 5 MG tablet Take 1 tablet (5 mg total) by mouth daily. 03/31/23  Yes Sharon Seller, NP  cholecalciferol (VITAMIN D3) 25 MCG (1000 UT) tablet Take 1,000 Units by mouth daily.   Yes [provider]  Multiple Vitamin (MULTIVITAMIN WITH MINERALS) TABS tablet Take 1 tablet by mouth daily.   Yes [provider]  zoledronic acid (RECLAST) 5 MG/100ML SOLN injection Inject 5 mg into the vein as directed. Once yearly   Yes [provider]   Recent Labs    10/19/23 2149 10/20/23 0309  WBC 13.3* 18.1*  HGB 13.9 13.6  HCT 40.7 39.7  PLT 331 310  K 3.3* 3.6  CL 104 102  CO2 26 24  BUN 20 16  CREATININE 0.81 0.75  GLUCOSE 140* 171*  CALCIUM 9.0 8.9   DG Femur Min 2 Views Left  Result Date: 10/20/2023 CLINICAL DATA:  Fall with fracture EXAM: LEFT FEMUR 2 VIEWS COMPARISON:  None Available. FINDINGS: Acute comminuted fracture involving the proximal shaft of the femur with apex anterior angulation and close to 1 shaft diameter posterior placement of distal fracture fragment. Vascular calcifications.  IMPRESSION: Acute comminuted, displaced and angulated proximal femoral shaft fracture Electronically Signed   By: Jasmine Pang M.D.   On: 10/20/2023 00:09   DG Chest 1 View  Result Date: 10/19/2023 CLINICAL DATA:  Fall with hip fracture EXAM: CHEST  1 VIEW COMPARISON:  07/31/2017 FINDINGS: No acute airspace disease or pleural effusion. Stable cardiomediastinal silhouette with aortic atherosclerosis. No pneumothorax. IMPRESSION: No active disease. Electronically Signed   By: Jasmine Pang M.D.   On: 10/19/2023 23:12   DG Hip Unilat W or Wo Pelvis 2-3 Views Left  Result Date: 10/19/2023 CLINICAL DATA:  Fall with hip deformity EXAM: DG HIP (WITH OR WITHOUT PELVIS) 2-3V LEFT COMPARISON:  None Available. FINDINGS: SI joints are non widened. Pubic symphysis appears intact. Acute comminuted fracture involving the subtrochanteric femoral diaphysis with moderate apex lateral angulation and about 1/2 shaft diameter central and greater than 1 shaft diameter posterior displacement of distal fracture fragment. Left femoral head projects in joint. Probable old fracture deformities of the left superior and inferior pubic rami. IMPRESSION: Acute comminuted displaced and angulated subtrochanteric left femoral diaphyseal fracture. Electronically Signed   By: Jasmine Pang M.D.   On: 10/19/2023 23:11     Positive ROS: All other systems have been reviewed and were otherwise negative with the exception of those mentioned in the HPI and as above.  Physical Exam: BP (!) 162/70   Pulse 91   Temp 98.1 F (36.7 C) (Temporal)   Resp 17   SpO2 97%  General:  Alert, no acute distress Psychiatric:  Patient is competent for consent with normal mood and affect    Orthopedic Exam:  LLE: + DF/PF/EHL SILT grossly over foot Foot wwp +Log roll/axial load   Imaging:  As above: L subtrochanteric hip fracture  Assessment/Plan: Lori Edwards is a 89 y.o. female with a L subtrochanteric hip fracture  1. I discussed  the various treatment options including both surgical and non-surgical management of the fracture with the patient  We discussed the high risk of perioperative complications due to patient's age and other co-morbidities. After discussion of risks, benefits, and alternatives to surgery, the patient was in agreement to proceed with surgery. The goals of surgery would be to provide adequate pain relief and allow for mobilization. Plan for surgery is L hip cephalomedullary nailing today, 10/20/2023. 2. NPO until OR 3. Hold anticoagulation in advance of OR   Signa Kell   10/20/2023 12:04 PM

## 2023-10-20 NOTE — OR Nursing (Signed)
Lab called to report pt tested positive for MRSA in nose from nasal swab.  Dr. Allena Katz was notified.

## 2023-10-20 NOTE — Assessment & Plan Note (Addendum)
Due to mechanical fall --Orthopedic surgery managing --s/p IM nail fixation on 10/20/23 with Dr. Allena Katz --Pain control and bowel regimen --PT/OT evaluations for d/c planning --TOC following  --Further recs per Ortho: WBAT on left leg, Lovenox for DVT ppx, follow up in 2 weeks for staple removal and x-rays

## 2023-10-20 NOTE — Progress Notes (Signed)
Initial Nutrition Assessment  DOCUMENTATION CODES:   Not applicable  INTERVENTION:   -Obtain new wt -Once diet is advanced, add:   -Magic cup BID with meals, each supplement provides 290 kcal and 9 grams of protein  -MVI with minerals daily  NUTRITION DIAGNOSIS:   Increased nutrient needs related to post-op healing as evidenced by estimated needs.  GOAL:   Patient will meet greater than or equal to 90% of their needs  MONITOR:   PO intake, Supplement acceptance, Diet advancement  REASON FOR ASSESSMENT:   Consult Assessment of nutrition requirement/status, Hip fracture protocol  ASSESSMENT:   Pt with medical history significant for hypertension and dyslipidemia, who presented to the emergency room with acute onset of mechanical fall down marble staircase resulting in a deformity and severe pain of the left thigh.  Pt admitted with displaced lt femur fracture s/p fall.  Reviewed I/O's: -1 L x 24 hours  UOP: 1 L x 24 hours  Per orthopedics notes, plan for surgery today. Pt is NPO for procedure.  Spoke with pt at bedside, who complain of pain and fatigue at time of visit. He reports good appetite PTA, consuming 2 meals and a snack daily (Breakfast: cereal; Lunch: soup and sandwich; snack: peanut butter crackers). Pt resides at [facility].   Reviewed wt hx; no wt loss noted over the past year. Pt denies weight loss, stating "I'm 20 pounds overweight". Pt reports she is very active and participates in line dancing to keep her mind and body sharp.   Discussed importance of good meal and supplement intake to promote healing.   Medications reviewed and include vitamin D3, MVI, and 0.9% sodium chloride infusion @ 40 ml/hr.   Labs reviewed.    NUTRITION - FOCUSED PHYSICAL EXAM:  Flowsheet Row Most Recent Value  Orbital Region No depletion  Upper Arm Region No depletion  Thoracic and Lumbar Region No depletion  Buccal Region No depletion  Temple Region No depletion   Clavicle Bone Region No depletion  Clavicle and Acromion Bone Region No depletion  Scapular Bone Region No depletion  Dorsal Hand No depletion  Patellar Region No depletion  Anterior Thigh Region No depletion  Posterior Calf Region No depletion  Edema (RD Assessment) Mild  Hair Reviewed  Eyes Reviewed  Mouth Reviewed  Skin Reviewed  Nails Reviewed       Diet Order:   Diet Order             Diet NPO time specified  Diet effective now                   EDUCATION NEEDS:   Education needs have been addressed  Skin:  Skin Assessment: Reviewed RN Assessment  Last BM:  10/19/23  Height:   Ht Readings from Last 1 Encounters:  09/26/23 4\' 11"  (1.499 m)    Weight:   Wt Readings from Last 1 Encounters:  09/26/23 63 kg    Ideal Body Weight:  44.7 kg  BMI:  There is no height or weight on file to calculate BMI.  Estimated Nutritional Needs:   Kcal:  1350-1550  Protein:  65-80 grams  Fluid:  > 1.3 L    Levada Schilling, RD, LDN, CDCES Registered Dietitian III Certified Diabetes Care and Education Specialist Please refer to [facility] for RD and/or RD on-call/weekend/after hours pager

## 2023-10-20 NOTE — Anesthesia Preprocedure Evaluation (Signed)
Anesthesia Evaluation  Patient identified by MRN, date of birth, ID band Patient awake    Reviewed: Allergy & Precautions, NPO status , Patient's Chart, lab work & pertinent test results  Airway Mallampati: III  TM Distance: >3 FB Neck ROM: full    Dental  (+) Partial Upper   Pulmonary neg pulmonary ROS   Pulmonary exam normal breath sounds clear to auscultation       Cardiovascular Exercise Tolerance: Good hypertension, Pt. on medications negative cardio ROS Normal cardiovascular exam Rhythm:Regular Rate:Normal     Neuro/Psych  Headaches negative neurological ROS  negative psych ROS   GI/Hepatic negative GI ROS, Neg liver ROS,,,  Endo/Other  negative endocrine ROS    Renal/GU   negative genitourinary   Musculoskeletal   Abdominal Normal abdominal exam  (+)   Peds negative pediatric ROS (+)  Hematology negative hematology ROS (+)   Anesthesia Other Findings Past Medical History: No date: Hyperlipidemia No date: Hypertension  Past Surgical History: 07/31/2017: OPEN REDUCTION INTERNAL FIXATION (ORIF) DISTAL RADIAL  FRACTURE; Right     Comment:  Procedure: OPEN REDUCTION INTERNAL FIXATION (ORIF)               DISTAL RADIAL FRACTURE;  Surgeon: Deeann Saint, MD;                Location: [facility] ORS;  Service: Orthopedics;  Laterality:               Right;     Reproductive/Obstetrics negative OB ROS                             Anesthesia Physical Anesthesia Plan  ASA: 2  Anesthesia Plan: Spinal   Post-op Pain Management:    Induction:   PONV Risk Score and Plan:   Airway Management Planned: Natural Airway and Nasal Cannula  Additional Equipment:   Intra-op Plan:   Post-operative Plan:   Informed Consent: I have reviewed the patients History and Physical, chart, labs and discussed the procedure including the risks, benefits and alternatives for the proposed anesthesia  with the patient or authorized representative who has indicated his/her understanding and acceptance.     Dental Advisory Given  Plan Discussed with: CRNA and Surgeon  Anesthesia Plan Comments:        Anesthesia Quick Evaluation

## 2023-10-20 NOTE — Plan of Care (Signed)
  Problem: Skin Integrity: Goal: Risk for impaired skin integrity will decrease Outcome: Progressing   Problem: Safety: Goal: Ability to remain free from injury will improve Outcome: Progressing   Problem: Pain Management: Goal: General experience of comfort will improve Outcome: Progressing   Problem: Elimination: Goal: Will not experience complications related to bowel motility Outcome: Progressing   Problem: Nutrition: Goal: Adequate nutrition will be maintained Outcome: Progressing

## 2023-10-20 NOTE — Assessment & Plan Note (Signed)
Resulting in femur fracture. Fall precautions PT/OT

## 2023-10-20 NOTE — Progress Notes (Signed)
  Progress Note   Patient: Lori Edwards GEX:[id number] DOB: August 17, 1929 DOA: 10/19/2023     1 DOS: the patient was seen and examined on 10/20/2023   Brief hospital course:  HPI on admission 10/19/23:  "Lori ANGELIZ SETTLEMYRE is a 89 y.o. female with medical history significant for hypertension and dyslipidemia, who presented to the emergency room with acute onset of mechanical fall down marble staircase resulting in a deformity and severe pain of the left thigh.  She was unable to bear any weight on the left side increased her pain 8/10 in severity after being given fentanyl by EMS.  Her left leg was shortened and externally rotated upon arrival to the emergency room.  ..." See H&P for full HPI on admission and ED course.  Patient was found to have a left mid-shaft displaced femur fracture, and was admitted to medicine service with orthopedic surgery consulted.  11/1 -- pt taken to OR for surgical repair with Dr. Allena Katz.   Assessment and Plan: * Displaced comminuted fracture of shaft of left femur, initial encounter for closed fracture (HCC) Due to mechanical fall --Orthopedic surgery managing --To OR for repair today --Pain control and bowel regimen --PT post-op tomorrow --Further recs per Ortho  Essential hypertension On home amlodipine  Fall Resulting in femur fracture. Fall precautions PT/OT   Closed displaced comminuted fracture of shaft of left femur (HCC) .  Dyslipidemia diet managed, not on medication        Subjective: Pt seen before surgery today.  At rest and still, she denies significant pain.  Pain worsens with any movement.  She denies any other complaints.  A friend was arriving to bedside.   Physical Exam: Vitals:   10/20/23 1445 10/20/23 1500 10/20/23 1515 10/20/23 1530  BP: (!) 138/53 (!) 139/54 (!) 143/62 (!) 148/61  Pulse: 67 68 71 72  Resp: 13 11 14 20   Temp:      TempSrc:      SpO2: 100% 100% 97% 98%   General exam: awake, alert, no acute  distress HEENT: atraumatic, clear conjunctiva, anicteric sclera, moist mucus membranes, hearing grossly normal  Respiratory system: CTAB, no wheezes, rales or rhonchi, normal respiratory effort. Cardiovascular system: normal S1/S2, RRR, no JVD, murmurs, rubs, gallops,  no pedal edema.   Gastrointestinal system: soft, NT, ND, no HSM felt, +bowel sounds. Central nervous system: A&O x 4. no gross focal neurologic deficits, normal speech Extremities: LLE shortened and externally rotated, no edema, normal tone Skin: dry, intact, normal temperature Psychiatry: normal mood, congruent affect, judgement and insight appear normal    Data Reviewed:  Notable labs --  WBC 18.1, glucose 171 otherwise normal CBC and BMP  Family Communication: Pt able to update.  Friend arrived to bedside during encounter, updated with patient's permission.  Disposition: Status is: Inpatient Remains inpatient appropriate because: Surgery today, discharge planning including PT/OT evaluations pending postoperatively.   Planned Discharge Destination: Skilled nursing facility    Time spent: 38 minutes  Author: Pennie Banter, DO 10/20/2023 3:45 PM  For on call review [email or web address].

## 2023-10-20 NOTE — H&P (Signed)
H&P reviewed. No significant changes noted.  

## 2023-10-20 NOTE — Anesthesia Procedure Notes (Signed)
Procedure Name: Intubation Date/Time: 10/20/2023 12:17 PM  Performed by: Elisabeth Pigeon, CRNAPre-anesthesia Checklist: Patient identified, Patient being monitored, Timeout performed, Emergency Drugs available and Suction available Patient Re-evaluated:Patient Re-evaluated prior to induction Oxygen Delivery Method: Circle system utilized Preoxygenation: Pre-oxygenation with 100% oxygen Induction Type: IV induction Ventilation: Mask ventilation without difficulty Laryngoscope Size: Mac, 3 and McGraph Grade View: Grade I Tube type: Oral Tube size: 7.0 mm Number of attempts: 1 Airway Equipment and Method: Stylet Placement Confirmation: ETT inserted through vocal cords under direct vision, positive ETCO2 and breath sounds checked- equal and bilateral Secured at: 21 cm Tube secured with: Tape Dental Injury: Teeth and Oropharynx as per pre-operative assessment

## 2023-10-20 NOTE — Transfer of Care (Signed)
Immediate Anesthesia Transfer of Care Note  Patient: Lori Edwards  Procedure(s) Performed: INTRAMEDULLARY (IM) NAIL INTERTROCHANTERIC (Left: Hip)  Patient Location: PACU  Anesthesia Type:General  Level of Consciousness: drowsy  Airway & Oxygen Therapy: Patient Spontanous Breathing and Patient connected to face mask oxygen  Post-op Assessment: Report given to RN and Post -op Vital signs reviewed and stable  Post vital signs: Reviewed and stable  Last Vitals:  Vitals Value Taken Time  BP 142/61 10/20/23 1440  Temp 36.7 C 10/20/23 1440  Pulse 71 10/20/23 1445  Resp 10 10/20/23 1445  SpO2 100 % 10/20/23 1445  Vitals shown include unfiled device data.  Last Pain:  Vitals:   10/20/23 1440  TempSrc: Temporal  PainSc:       Patients Stated Pain Goal: 3 (10/19/23 2141)  Complications: No notable events documented.

## 2023-10-21 ENCOUNTER — Encounter: Payer: Self-pay | Admitting: Orthopedic Surgery

## 2023-10-21 DIAGNOSIS — S72352A Displaced comminuted fracture of shaft of left femur, initial encounter for closed fracture: Secondary | ICD-10-CM | POA: Diagnosis not present

## 2023-10-21 LAB — CBC
HCT: 34.3 % — ABNORMAL LOW (ref 36.0–46.0)
Hemoglobin: 12 g/dL (ref 12.0–15.0)
MCH: 30.8 pg (ref 26.0–34.0)
MCHC: 35 g/dL (ref 30.0–36.0)
MCV: 88.2 fL (ref 80.0–100.0)
Platelets: 276 10*3/uL (ref 150–400)
RBC: 3.89 MIL/uL (ref 3.87–5.11)
RDW: 13.2 % (ref 11.5–15.5)
WBC: 12.5 10*3/uL — ABNORMAL HIGH (ref 4.0–10.5)
nRBC: 0 % (ref 0.0–0.2)

## 2023-10-21 LAB — BASIC METABOLIC PANEL
Anion gap: 9 (ref 5–15)
BUN: 18 mg/dL (ref 8–23)
CO2: 22 mmol/L (ref 22–32)
Calcium: 8.5 mg/dL — ABNORMAL LOW (ref 8.9–10.3)
Chloride: 102 mmol/L (ref 98–111)
Creatinine, Ser: 0.81 mg/dL (ref 0.44–1.00)
GFR, Estimated: >60 mL/min (ref >60)
Glucose, Bld: 145 mg/dL — ABNORMAL HIGH (ref 70–99)
Potassium: 3.6 mmol/L (ref 3.5–5.1)
Sodium: 133 mmol/L — ABNORMAL LOW (ref 135–145)

## 2023-10-21 MED ORDER — ENOXAPARIN SODIUM 40 MG/0.4ML IJ SOSY: 40.0000 mg | PREFILLED_SYRINGE | INTRAMUSCULAR | 0 refills | Status: DC

## 2023-10-21 MED ORDER — OXYCODONE HCL 5 MG PO TABS: 2.5000 mg | ORAL_TABLET | ORAL | 0 refills | Status: DC | PRN

## 2023-10-21 MED ORDER — ONDANSETRON HCL 4 MG PO TABS: 4.0000 mg | ORAL_TABLET | Freq: Four times a day (QID) | ORAL | 0 refills | Status: DC | PRN

## 2023-10-21 NOTE — TOC Initial Note (Signed)
Transition of Care [facility]) - Initial/Assessment Note    Patient Details  Name: Lori Edwards MRN: [id number] Date of Birth: 1929/01/10  Transition of Care [facility]) CM/SW Contact:    Luvenia Redden, RN Phone Number: 10/21/2023, 12:03 PM  Clinical Narrative:                 RN TOC who spoke with pt who indicates she want to go to [facility] level of care then return to her independent living facility. States  Expected Discharge Plan: Skilled Nursing Facility Barriers to Discharge: No Barriers Identified   Patient Goals and CMS Choice Patient states their goals for this hospitalization and ongoing recovery are:: To return to [facility] rehab level of care   Choice offered to / list presented to : Patient      Expected Discharge Plan and Services   Discharge Planning Services: CM Consult Post Acute Care Choice: Skilled Nursing Facility Living arrangements for the past 2 months: Independent Living Facility                 DME Arranged: N/A DME Agency: NA       HH Arranged: NA HH Agency: NA        Prior Living Arrangements/Services Living arrangements for the past 2 months: Independent Living Facility Lives with:: Self Patient language and need for interpreter reviewed:: Yes        Need for Family Participation in Patient Care: Yes (Comment) Care giver support system in place?: Yes (comment) Current home services: DME (Front wheel walker) Criminal Activity/Legal Involvement Pertinent to Current Situation/Hospitalization: No - Comment as needed  Activities of Daily Living   ADL Screening (condition at time of admission) Independently performs ADLs?: Yes (appropriate for developmental age) Is the patient deaf or have difficulty hearing?: No Does the patient have difficulty seeing, even when wearing glasses/contacts?: No Does the patient have difficulty concentrating, remembering, or making decisions?: No  Permission Sought/Granted                   Emotional Assessment Appearance:: Appears stated age Attitude/Demeanor/Rapport: Engaged Affect (typically observed): Accepting Orientation: : Oriented to Self, Oriented to Place, Oriented to  Time, Oriented to Situation Alcohol / Substance Use: Not Applicable Psych Involvement: No (comment)  Admission diagnosis:  Closed displaced comminuted fracture of shaft of left femur, initial encounter (HCC) [S72.352A] Fall, initial encounter [W19.XXXA] Stress fracture of femoral shaft, left, initial encounter [W09.811B] Patient Active Problem List   Diagnosis Date Noted   Displaced comminuted fracture of shaft of left femur, initial encounter for closed fracture (HCC) 10/20/2023   Dyslipidemia 10/19/2023   Closed displaced comminuted fracture of shaft of left femur (HCC) 10/19/2023   Fall 10/19/2023   Chronic kidney disease, stage 3a (HCC) 03/31/2023   Essential hypertension 08/25/2016   Hyperlipidemia 08/25/2016   Osteoporosis 08/25/2016   PCP:  Sharon Seller, NP Pharmacy:   [id number] [place], [place] - [address] [place] [address] Phone: [phone] Fax: [phone]     Social Determinants of Health (SDOH) Social History: SDOH Screenings   Food Insecurity: No Food Insecurity (10/20/2023)  Housing: Patient Declined (10/20/2023)  Transportation Needs: No Transportation Needs (10/20/2023)  Utilities: Not At Risk (10/20/2023)  Depression (PHQ2-9): Low Risk  (09/26/2023)  Financial Resource Strain: Low Risk  (07/12/2018)  Physical Activity: Unknown (09/13/2019)  Stress: No Stress Concern Present (07/12/2018)  Tobacco Use: Low Risk  (10/20/2023)   SDOH Interventions:  Readmission Risk Interventions     No data to display

## 2023-10-21 NOTE — Plan of Care (Signed)

## 2023-10-21 NOTE — Plan of Care (Signed)

## 2023-10-21 NOTE — NC FL2 (Signed)
[place] MEDICAID FL2 LEVEL OF CARE FORM     IDENTIFICATION  Patient Name: Lori Edwards Birthdate: [birth year] Sex: female Admission Date (Current Location): 10/19/2023  [facility] and [place] Number:  Chiropodist and Address:  [facility], [address], [place], [place] [address]      Provider Number: [id number]  Attending Physician Name and Address:  Pennie Banter, DO  Relative Name and Phone Number:  Dennis Bast (son) [phone]    Current Level of Care: SNF Recommended Level of Care: Skilled Nursing Facility Prior Approval Number:    Date Approved/Denied:   PASRR Number:    Discharge Plan: SNF    Current Diagnoses: Patient Active Problem List   Diagnosis Date Noted   Displaced comminuted fracture of shaft of left femur, initial encounter for closed fracture (HCC) 10/20/2023   Dyslipidemia 10/19/2023   Closed displaced comminuted fracture of shaft of left femur (HCC) 10/19/2023   Fall 10/19/2023   Chronic kidney disease, stage 3a (HCC) 03/31/2023   Essential hypertension 08/25/2016   Hyperlipidemia 08/25/2016   Osteoporosis 08/25/2016    Orientation RESPIRATION BLADDER Height & Weight     Self, Time, Situation, Place  Normal Continent Weight: 72.2 kg Height:  4\' 11"  (149.9 cm)  BEHAVIORAL SYMPTOMS/MOOD NEUROLOGICAL BOWEL NUTRITION STATUS      Continent Diet  AMBULATORY STATUS COMMUNICATION OF NEEDS Skin   Limited Assist Verbally  (Left knee lateral prcedure/skin glue/ sauguineous drainage)                       Personal Care Assistance Level of Assistance  Bathing, Dressing Bathing Assistance: Limited assistance   Dressing Assistance: Limited assistance     Functional Limitations Info             SPECIAL CARE FACTORS FREQUENCY  PT (By licensed PT), OT (By licensed OT)     PT Frequency: 5 X weekly OT Frequency: 5 X weekly            Contractures Contractures Info: Not present    Additional  Factors Info                  Current Medications (10/21/2023):  This is the current hospital active medication list Current Facility-Administered Medications  Medication Dose Route Frequency Provider Last Rate Last Admin   acetaminophen (TYLENOL) tablet 1,000 mg  1,000 mg Oral Q8H Signa Kell, MD   1,000 mg at 10/21/23 0522   acetaminophen (TYLENOL) tablet 325-650 mg  325-650 mg Oral Q6H PRN Signa Kell, MD       amLODipine (NORVASC) tablet 5 mg  5 mg Oral Daily Signa Kell, MD   5 mg at 10/21/23 1013   bisacodyl (DULCOLAX) suppository 10 mg  10 mg Rectal Daily PRN Signa Kell, MD       cholecalciferol (VITAMIN D3) 25 MCG (1000 UNIT) tablet 1,000 Units  1,000 Units Oral Daily Signa Kell, MD   1,000 Units at 10/21/23 1013   docusate sodium (COLACE) capsule 100 mg  100 mg Oral BID Signa Kell, MD   100 mg at 10/21/23 1013   enoxaparin (LOVENOX) injection 40 mg  40 mg Subcutaneous Q24H Signa Kell, MD   40 mg at 10/21/23 0747   HYDROmorphone (DILAUDID) injection 0.2-0.4 mg  0.2-0.4 mg Intravenous Q4H PRN Signa Kell, MD       ketorolac (TORADOL) 15 MG/ML injection 7.5 mg  7.5 mg Intravenous Q6H Signa Kell, MD   7.5  mg at 10/21/23 0522   methocarbamol (ROBAXIN) tablet 500 mg  500 mg Oral Q6H PRN Signa Kell, MD       Or   methocarbamol (ROBAXIN) injection 500 mg  500 mg Intravenous Q6H PRN Signa Kell, MD       metoCLOPramide (REGLAN) tablet 5-10 mg  5-10 mg Oral Q8H PRN Signa Kell, MD       Or   metoCLOPramide (REGLAN) injection 5-10 mg  5-10 mg Intravenous Q8H PRN Signa Kell, MD   5 mg at 10/20/23 1803   multivitamin with minerals tablet 1 tablet  1 tablet Oral Daily Signa Kell, MD   1 tablet at 10/21/23 1012   mupirocin ointment (BACTROBAN) 2 % 1 Application  1 Application Topical BID Signa Kell, MD   1 Application at 10/21/23 1013   ondansetron (ZOFRAN) tablet 4 mg  4 mg Oral Q6H PRN Signa Kell, MD       Or   ondansetron [facility]) injection 4 mg  4 mg Intravenous  Q6H PRN Signa Kell, MD   4 mg at 10/21/23 0746   oxyCODONE (Oxy IR/ROXICODONE) immediate release tablet 2.5-5 mg  2.5-5 mg Oral Q4H PRN Signa Kell, MD       oxyCODONE (Oxy IR/ROXICODONE) immediate release tablet 5-10 mg  5-10 mg Oral Q4H PRN Signa Kell, MD       senna-docusate (Senokot-S) tablet 1 tablet  1 tablet Oral QHS PRN Signa Kell, MD       sodium phosphate (FLEET) enema 1 enema  1 enema Rectal Once PRN Signa Kell, MD         Discharge Medications: Please see discharge summary for a list of discharge medications.  Relevant Imaging Results:  Relevant Lab Results:   Additional Information SS# [id number]  Luvenia Redden, RN

## 2023-10-21 NOTE — Discharge Instructions (Signed)
INSTRUCTIONS AFTER Surgery  Remove items at home which could result in a fall. This includes throw rugs or furniture in walking pathways ICE to the affected joint every three hours while awake for 30 minutes at a time, for at least the first 3-5 days, and then as needed for pain and swelling.  Continue to use ice for pain and swelling. You may notice swelling that will progress down to the foot and ankle.  This is normal after surgery.  Elevate your leg when you are not up walking on it.   Continue to use the breathing machine you got in the hospital (incentive spirometer) which will help keep your temperature down.  It is common for your temperature to cycle up and down following surgery, especially at night when you are not up moving around and exerting yourself.  The breathing machine keeps your lungs expanded and your temperature down.   DIET:  As you were doing prior to hospitalization, we recommend a well-balanced diet.  DRESSING / WOUND CARE / SHOWERING  Dressing changes needed.  No showering.  Patient will follow-up at [facility] clinic orthopedics in 2 weeks for x-rays of the left hip and staple removal.  ACTIVITY  Increase activity slowly as tolerated, but follow the weight bearing instructions below.   No driving for 6 weeks or until further direction given by your physician.  You cannot drive while taking narcotics.  No lifting or carrying greater than 10 lbs. until further directed by your surgeon. Avoid periods of inactivity such as sitting longer than an hour when not asleep. This helps prevent blood clots.  You may return to work once you are authorized by your doctor.     WEIGHT BEARING  Weightbearing as tolerated on the left   EXERCISES Gait training and ambulation training with physical therapy  CONSTIPATION  Constipation is defined medically as fewer than three stools per week and severe constipation as less than one stool per week.  Even if you have a regular bowel  pattern at home, your normal regimen is likely to be disrupted due to multiple reasons following surgery.  Combination of anesthesia, postoperative narcotics, change in appetite and fluid intake all can affect your bowels.   YOU MUST use at least one of the following options; they are listed in order of increasing strength to get the job done.  They are all available over the counter, and you may need to use some, POSSIBLY even all of these options:    Drink plenty of fluids (prune juice may be helpful) and high fiber foods Colace 100 mg by mouth twice a day  Senokot for constipation as directed and as needed Dulcolax (bisacodyl), take with full glass of water  Miralax (polyethylene glycol) once or twice a day as needed.  If you have tried all these things and are unable to have a bowel movement in the first 3-4 days after surgery call either your surgeon or your primary doctor.    If you experience loose stools or diarrhea, hold the medications until you stool forms back up.  If your symptoms do not get better within 1 week or if they get worse, check with your doctor.  If you experience "the worst abdominal pain ever" or develop nausea or vomiting, please contact the office immediately for further recommendations for treatment.   ITCHING:  If you experience itching with your medications, try taking only a single pain pill, or even half a pain pill at a time.  You can also use Benadryl over the counter for itching or also to help with sleep.   TED HOSE STOCKINGS:  Use stockings on both legs until for at least 2 weeks or as directed by physician office. They may be removed at night for sleeping.  MEDICATIONS:  See your medication summary on the "After Visit Summary" that nursing will review with you.  You may have some home medications which will be placed on hold until you complete the course of blood thinner medication.  It is important for you to complete the blood thinner medication as  prescribed.  PRECAUTIONS:  If you experience chest pain or shortness of breath - call 911 immediately for transfer to the hospital emergency department.   If you develop a fever greater that 101 F, purulent drainage from wound, increased redness or drainage from wound, foul odor from the wound/dressing, or calf pain - CONTACT YOUR SURGEON.                                                   FOLLOW-UP APPOINTMENTS:  If you do not already have a post-op appointment, please call the office for an appointment to be seen by your surgeon.  Guidelines for how soon to be seen are listed in your "After Visit Summary", but are typically between 1-4 weeks after surgery.  OTHER INSTRUCTIONS:     MAKE SURE YOU:  Understand these instructions.  Get help right away if you are not doing well or get worse.    Thank you for letting us be a part of your medical care team.  It is a privilege we respect greatly.  We hope these instructions will help you stay on track for a fast and full recovery!

## 2023-10-21 NOTE — Evaluation (Signed)
Occupational Therapy Evaluation Patient Details Name: Lori Edwards MRN: [id number] DOB: [birth year] Today's Date: 10/21/2023   History of Present Illness Pt is a 89 y.o. female s/p L IM nailing of L hip on 10/20/23.   Clinical Impression   Ms Antone was seen for OT evaluation this date. Prior to hospital admission, pt was IND. Pt lives at [facility]. Pt currently requires SBA + RW for functional mobility ~40 ft and standing grooming tasks. Reports 3/10 LLE pain t/o, fatigues and requests to return to bed. Pt would benefit from skilled OT to address noted impairments and functional limitations (see below for any additional details). Upon hospital discharge, recommend OT follow up.    If plan is discharge home, recommend the following: A little help with walking and/or transfers;A little help with bathing/dressing/bathroom;Help with stairs or ramp for entrance    Functional Status Assessment  Patient has had a recent decline in their functional status and demonstrates the ability to make significant improvements in function in a reasonable and predictable amount of time.  Equipment Recommendations  BSC/3in1    Recommendations for Other Services       Precautions / Restrictions Precautions Precautions: Fall Restrictions Weight Bearing Restrictions: Yes LLE Weight Bearing: Weight bearing as tolerated      Mobility Bed Mobility Overal bed mobility: Needs Assistance Bed Mobility: Sit to Supine       Sit to supine: Supervision        Transfers Overall transfer level: Needs assistance Equipment used: Rolling walker (2 wheels) Transfers: Sit to/from Stand Sit to Stand: Supervision           General transfer comment: SBA      Balance Overall balance assessment: Needs assistance Sitting-balance support: Feet supported Sitting balance-Leahy Scale: Good     Standing balance support: No upper extremity supported, During functional activity Standing balance-Leahy  Scale: Fair                             ADL either performed or assessed with clinical judgement   ADL Overall ADL's : Needs assistance/impaired                                       General ADL Comments: SBA + RW for ADL t/f and standing grooming tasks.      Pertinent Vitals/Pain Pain Assessment Pain Assessment: 0-10 Pain Score: 3  Pain Location: L hip Pain Descriptors / Indicators: Discomfort Pain Intervention(s): Limited activity within patient's tolerance, Repositioned     Extremity/Trunk Assessment Upper Extremity Assessment Upper Extremity Assessment: Overall WFL for tasks assessed   Lower Extremity Assessment Lower Extremity Assessment: Generalized weakness LLE Deficits / Details: s/p L hip IM nailing LLE Sensation: WNL   Cervical / Trunk Assessment Cervical / Trunk Assessment: Normal   Communication Communication Communication: No apparent difficulties Cueing Techniques: Verbal cues   Cognition Arousal: Alert Behavior During Therapy: WFL for tasks assessed/performed Overall Cognitive Status: Within Functional Limits for tasks assessed                                          Exercises Other Exercises Other Exercises: Educated on DME recs        Home Living Family/patient expects to be discharged to::  Assisted living                                 Additional Comments: [facility]      Prior Functioning/Environment Prior Level of Function : Independent/Modified Independent               ADLs Comments: still driving        OT Problem List: Decreased activity tolerance;Impaired balance (sitting and/or standing)      OT Treatment/Interventions: Self-care/ADL training;Therapeutic exercise;Energy conservation;DME and/or AE instruction;Therapeutic activities;Patient/family education;Balance training    OT Goals(Current goals can be found in the care plan section) Acute Rehab OT  Goals Patient Stated Goal: to go home OT Goal Formulation: With patient Time For Goal Achievement: 11/04/23 Potential to Achieve Goals: Good ADL Goals Pt Will Perform Grooming: with modified independence;standing Pt Will Perform Lower Body Dressing: with modified independence;sit to/from stand Pt Will Transfer to Toilet: with modified independence;ambulating;regular height toilet  OT Frequency: Min 1X/week    Co-evaluation              AM-PAC OT "6 Clicks" Daily Activity     Outcome Measure Help from another person eating meals?: None Help from another person taking care of personal grooming?: A Little Help from another person toileting, which includes using toliet, bedpan, or urinal?: A Little Help from another person bathing (including washing, rinsing, drying)?: A Lot Help from another person to put on and taking off regular upper body clothing?: A Little Help from another person to put on and taking off regular lower body clothing?: A Lot 6 Click Score: 17   End of Session Equipment Utilized During Treatment: Rolling walker (2 wheels)  Activity Tolerance: Patient tolerated treatment well Patient left: in bed;with call bell/phone within reach;with bed alarm set  OT Visit Diagnosis: Other abnormalities of gait and mobility (R26.89);Muscle weakness (generalized) (M62.81)                Time: 6440-3474 OT Time Calculation (min): 12 min Charges:  OT General Charges $OT Visit: 1 Visit OT Evaluation $OT Eval Low Complexity: 1 Low  Kathie Dike, M.S. OTR/L  10/21/23, 11:20 AM  ascom [phone]

## 2023-10-21 NOTE — Anesthesia Postprocedure Evaluation (Signed)
Anesthesia Post Note  Patient: Lori Edwards  Procedure(s) Performed: INTRAMEDULLARY (IM) NAIL INTERTROCHANTERIC (Left: Hip)  Patient location during evaluation: Nursing Unit Anesthesia Type: Spinal Level of consciousness: oriented and awake and alert Pain management: pain level controlled Vital Signs Assessment: post-procedure vital signs reviewed and stable Respiratory status: spontaneous breathing and respiratory function stable Cardiovascular status: blood pressure returned to baseline and stable Postop Assessment: no headache, no backache, no apparent nausea or vomiting and patient able to bend at knees Anesthetic complications: no   No notable events documented.   Last Vitals:  Vitals:   10/21/23 0500 10/21/23 0816  BP: (!) 137/57 (!) 152/65  Pulse: 73 75  Resp: 18 16  Temp: 36.4 C 37.2 C  SpO2: 95% 97%    Last Pain:  Vitals:   10/21/23 0801  TempSrc:   PainSc: 0-No pain                 Louie Boston

## 2023-10-21 NOTE — Evaluation (Signed)
Physical Therapy Evaluation Patient Details Name: Lori Edwards MRN: [id number] DOB: 1929/07/11 Today's Date: 10/21/2023  History of Present Illness  PT is a 89 y.o. female s/p L IM nailing of L hip on 10/20/23.  Clinical Impression  Pt admitted with above diagnosis. Pt currently with functional limitations due to the deficits listed below (see PT Problem List). Pt received upright in bed agreeable to PT. Reports mild pain but more discomfort from nausea. Reports PTA, pt was independent with mobility and lives at [facility] and still drives.   To date, pt is supervision for bed mobility but reliant on bed features to complete with increased time and mild VC's for positioning. Pt is able to STS and ambulate with RW and CGA with VC's for hand positioning and step to gait pattern 40' within room. Pt limited in her gait tolerance due to her nausea and mild dizziness. But able to adequately demo excellent tolerance for body weight onto her LLE except for mild antalgic gait. Reviewed some HEP exercises as listed below post session in recliner prior to exit. Pt understanding and with all needs in reach with ice donned to L hip and LE's elevated. Pt with all needs in reach. Pt will benefit from additional PT after d/c to address gait, strength, and AROM deficits to maximize return to PLOF.         If plan is discharge home, recommend the following: A little help with walking and/or transfers;A little help with bathing/dressing/bathroom;Assistance with cooking/housework;Assist for transportation;Help with stairs or ramp for entrance   Can travel by private vehicle   Yes    Equipment Recommendations Other (comment) (TBD by next venue of care)  Recommendations for Other Services       Functional Status Assessment Patient has had a recent decline in their functional status and demonstrates the ability to make significant improvements in function in a reasonable and predictable amount of time.      Precautions / Restrictions Restrictions Weight Bearing Restrictions: Yes LLE Weight Bearing: Weight bearing as tolerated      Mobility  Bed Mobility Overal bed mobility: Needs Assistance Bed Mobility: Supine to Sit     Supine to sit: Supervision, HOB elevated, Used rails     General bed mobility comments: increased time, use of bed rails Patient Response: Cooperative  Transfers Overall transfer level: Needs assistance Equipment used: Rolling walker (2 wheels) Transfers: Sit to/from Stand Sit to Stand: Contact guard assist           General transfer comment: VC's for hand placement    Ambulation/Gait Ambulation/Gait assistance: Contact guard assist Gait Distance (Feet): 38 Feet Assistive device: Rolling walker (2 wheels) Gait Pattern/deviations: Step-to pattern, Antalgic       General Gait Details: Mildly antalgic with step to pattern due to decreased stance time on LLE.  Stairs            Wheelchair Mobility     Tilt Bed Tilt Bed Patient Response: Cooperative  Modified Rankin (Stroke Patients Only)       Balance Overall balance assessment: Needs assistance Sitting-balance support: Feet supported Sitting balance-Leahy Scale: Good     Standing balance support: Bilateral upper extremity supported, During functional activity, Reliant on assistive device for balance Standing balance-Leahy Scale: Poor Standing balance comment: reliant on RW                             Pertinent Vitals/Pain Pain  Assessment Pain Assessment: Faces Faces Pain Scale: Hurts little more Pain Location: L hip Pain Descriptors / Indicators: Discomfort Pain Intervention(s): Limited activity within patient's tolerance, Monitored during session, Repositioned    Home Living Family/patient expects to be discharged to::  (ILF at [facility])                        Prior Function Prior Level of Function : Independent/Modified Independent                ADLs Comments: still driving     Extremity/Trunk Assessment   Upper Extremity Assessment Upper Extremity Assessment: Defer to OT evaluation    Lower Extremity Assessment Lower Extremity Assessment: Generalized weakness;LLE deficits/detail LLE Deficits / Details: s/p L hip IM nailing LLE Sensation: WNL    Cervical / Trunk Assessment Cervical / Trunk Assessment: Normal  Communication   Communication Communication: No apparent difficulties Cueing Techniques: Verbal cues  Cognition Arousal: Alert Behavior During Therapy: WFL for tasks assessed/performed Overall Cognitive Status: Within Functional Limits for tasks assessed                                          General Comments      Exercises General Exercises - Lower Extremity Ankle Circles/Pumps: AROM, Both, 10 reps, Supine Quad Sets: AROM, Strengthening, Left, 5 reps Long Arc Quad: AROM, Strengthening, Left, 10 reps Hip ABduction/ADduction: AROM, Strengthening, Left, 5 reps Other Exercises Other Exercises: Role of PT in acute setting, d/c recs, WB status, LE therex   Assessment/Plan    PT Assessment Patient needs continued PT services  PT Problem List Decreased strength;Pain;Decreased range of motion;Decreased activity tolerance;Decreased balance;Decreased mobility       PT Treatment Interventions DME instruction;Balance training;Gait training;Neuromuscular re-education;Stair training;Functional mobility training;Patient/family education;Therapeutic activities;Therapeutic exercise    PT Goals (Current goals can be found in the Care Plan section)  Acute Rehab PT Goals Patient Stated Goal: To go to [facility] PT Goal Formulation: With patient Time For Goal Achievement: 11/04/23 Potential to Achieve Goals: Good    Frequency 7X/week     Co-evaluation               AM-PAC PT "6 Clicks" Mobility  Outcome Measure Help needed turning from your back to your side while in a  flat bed without using bedrails?: A Little Help needed moving from lying on your back to sitting on the side of a flat bed without using bedrails?: A Lot Help needed moving to and from a bed to a chair (including a wheelchair)?: A Little Help needed standing up from a chair using your arms (e.g., wheelchair or bedside chair)?: A Little Help needed to walk in hospital room?: A Little Help needed climbing 3-5 steps with a railing? : A Lot 6 Click Score: 16    End of Session Equipment Utilized During Treatment: Gait belt Activity Tolerance: Patient tolerated treatment well Patient left: in chair;with call bell/phone within reach;with chair alarm set Nurse Communication: Mobility status PT Visit Diagnosis: Unsteadiness on feet (R26.81);Muscle weakness (generalized) (M62.81);Difficulty in walking, not elsewhere classified (R26.2);Pain Pain - Right/Left: Left Pain - part of body: Hip    Time: 0825-0848 PT Time Calculation (min) (ACUTE ONLY): 23 min   Charges:   PT Evaluation $PT Eval Low Complexity: 1 Low PT Treatments $Therapeutic Exercise: 8-22 mins PT General Charges $$ ACUTE  PT VISIT: 1 Visit        Delphia Grates. Fairly IV, PT, DPT Physical Therapist- [place]  [facility]  10/21/2023, 10:01 AM

## 2023-10-21 NOTE — Progress Notes (Signed)
  Subjective: 1 Day Post-Op Procedure(s) (LRB): INTRAMEDULLARY (IM) NAIL INTERTROCHANTERIC (Left) Patient reports pain as mild.   Patient is well, and has had no acute complaints or problems Plan is to go Rehab versus home after hospital stay. Negative for chest pain and shortness of breath Fever: no Gastrointestinal: Negative for nausea and vomiting  Objective: Vital signs in last 24 hours: Temp:  [97.6 F (36.4 C)-98.2 F (36.8 C)] 97.6 F (36.4 C) (11/02 0500) Pulse Rate:  [67-91] 73 (11/02 0500) Resp:  [11-20] 18 (11/02 0500) BP: (128-162)/(53-75) 137/57 (11/02 0500) SpO2:  [93 %-100 %] 95 % (11/02 0500) Weight:  [72.2 kg] 72.2 kg (11/01 2052)  Intake/Output from previous day:  Intake/Output Summary (Last 24 hours) at 10/21/2023 0637 Last data filed at 10/21/2023 0547 Gross per 24 hour  Intake 1091.16 ml  Output 2500 ml  Net -1408.84 ml    Intake/Output this shift: Total I/O In: 167.8 [IV Piggyback:167.8] Out: 550 [Urine:550]  Labs: Recent Labs    10/19/23 2149 10/20/23 0309 10/21/23 0320  HGB 13.9 13.6 12.0   Recent Labs    10/20/23 0309 10/21/23 0320  WBC 18.1* 12.5*  RBC 4.34 3.89  HCT 39.7 34.3*  PLT 310 276   Recent Labs    10/20/23 0309 10/21/23 0320  NA 137 133*  K 3.6 3.6  CL 102 102  CO2 24 22  BUN 16 18  CREATININE 0.75 0.81  GLUCOSE 171* 145*  CALCIUM 8.9 8.5*   No results for input(s): "LABPT", "INR" in the last 72 hours.   EXAM General - Patient is Alert and Oriented Extremity - Sensation intact distally Dorsiflexion/Plantar flexion intact Compartment soft Dressing/Incision - blood tinged drainage with hemorrhage from the proximal and middle incision.  A new bulky bandage was applied for better control.  There was no active bleeding. Motor Function - intact, moving foot and toes well on exam.   Past Medical History:  Diagnosis Date   Hyperlipidemia    Hypertension     Assessment/Plan: 1 Day Post-Op Procedure(s)  (LRB): INTRAMEDULLARY (IM) NAIL INTERTROCHANTERIC (Left) Principal Problem:   Displaced comminuted fracture of shaft of left femur, initial encounter for closed fracture [facility]) Active Problems:   Essential hypertension   Dyslipidemia   Closed displaced comminuted fracture of shaft of left femur (HCC)   Fall  Estimated body mass index is 32.15 kg/m as calculated from the following:   Height as of this encounter: 4\' 11"  (1.499 m).   Weight as of this encounter: 72.2 kg. Advance diet Up with therapy  Discharge planning: Patient will discharge to home versus rehab after completing physical therapy goals.  Plan to follow-up at [facility] clinic orthopedics in 2 weeks for staple removal and x-rays of the left hip.  DVT Prophylaxis - TED hose and Lovenox Weight-Bearing as tolerated to left leg  Dedra Skeens, PA-C Orthopaedic Surgery 10/21/2023, 6:37 AM

## 2023-10-21 NOTE — Progress Notes (Signed)
  Progress Note   Patient: Lori Edwards VHQ:[id number] DOB: [birth year] DOA: 10/19/2023     2 DOS: the patient was seen and examined on 10/21/2023   Brief hospital course:  HPI on admission 10/19/23:  "Lori Edwards is a 89 y.o. female with medical history significant for hypertension and dyslipidemia, who presented to the emergency room with acute onset of mechanical fall down marble staircase resulting in a deformity and severe pain of the left thigh.  She was unable to bear any weight on the left side increased her pain 8/10 in severity after being given fentanyl by EMS.  Her left leg was shortened and externally rotated upon arrival to the emergency room.  ..." See H&P for full HPI on admission and ED course.  Patient was found to have a left mid-shaft displaced femur fracture, and was admitted to medicine service with orthopedic surgery consulted.  11/1 -- pt taken to OR for surgical repair with Dr. Allena Katz.   Assessment and Plan: * Displaced comminuted fracture of shaft of left femur, initial encounter for closed fracture (HCC) Due to mechanical fall --Orthopedic surgery managing --s/p IM nail fixation on 10/20/23 with Dr. Allena Katz --Pain control and bowel regimen --PT/OT evaluations for d/c planning --TOC following  --Further recs per Ortho: WBAT on left leg, Lovenox for DVT ppx, follow up in 2 weeks for staple removal and x-rays  Essential hypertension On home amlodipine  Fall Resulting in femur fracture. Fall precautions PT/OT   Closed displaced comminuted fracture of shaft of left femur (HCC) .  Dyslipidemia diet managed, not on medication        Subjective: Pt seen up in recliner this AM.  She reports feeling well overall, denies significant pain or other complaints.  She is upset because she had planned to host family for Thanksgiving in a few weeks, but now might need to cancel given her injury.     Physical Exam: Vitals:   10/20/23 2052 10/21/23 0037  10/21/23 0500 10/21/23 0816  BP:  128/60 (!) 137/57 (!) 152/65  Pulse:  70 73 75  Resp:  18 18 16   Temp:  98.2 F (36.8 C) 97.6 F (36.4 C) 99 F (37.2 C)  TempSrc:  Oral Oral   SpO2:  98% 95% 97%  Weight: 72.2 kg     Height: 4\' 11"  (1.499 m)      General exam: awake, alert, no acute distress HEENT: moist mucus membranes, hearing grossly normal  Respiratory system: CTAB, no wheezes, rales or rhonchi, normal respiratory effort. Cardiovascular system: normal S1/S2, RRR, no pedal edema.   Gastrointestinal system: soft, NT, ND Central nervous system: A&O x 4. no gross focal neurologic deficits, normal speech Extremities: no edema, normal tone Skin: dry, intact, normal temperature Psychiatry: normal mood, congruent affect, judgement and insight appear normal    Data Reviewed:  Notable labs --  WBC 18.1, glucose 171 otherwise normal CBC and BMP  Family Communication: None present. Pt able to update.    Disposition: Status is: Inpatient Remains inpatient appropriate because: ongoing discharge planning including PT/OT evaluations    Planned Discharge Destination: Skilled nursing facility    Time spent: 35 minutes  Author: Pennie Banter, DO 10/21/2023 1:33 PM  For on call review [email or web address].

## 2023-10-22 DIAGNOSIS — S72352A Displaced comminuted fracture of shaft of left femur, initial encounter for closed fracture: Secondary | ICD-10-CM | POA: Diagnosis not present

## 2023-10-22 LAB — BASIC METABOLIC PANEL
Anion gap: 6 (ref 5–15)
BUN: 23 mg/dL (ref 8–23)
CO2: 25 mmol/L (ref 22–32)
Calcium: 8.5 mg/dL — ABNORMAL LOW (ref 8.9–10.3)
Chloride: 104 mmol/L (ref 98–111)
Creatinine, Ser: 0.74 mg/dL (ref 0.44–1.00)
GFR, Estimated: >60 mL/min (ref >60)
Glucose, Bld: 128 mg/dL — ABNORMAL HIGH (ref 70–99)
Potassium: 3.8 mmol/L (ref 3.5–5.1)
Sodium: 135 mmol/L (ref 135–145)

## 2023-10-22 LAB — CBC
HCT: 32.9 % — ABNORMAL LOW (ref 36.0–46.0)
Hemoglobin: 11.5 g/dL — ABNORMAL LOW (ref 12.0–15.0)
MCH: 30.8 pg (ref 26.0–34.0)
MCHC: 35 g/dL (ref 30.0–36.0)
MCV: 88.2 fL (ref 80.0–100.0)
Platelets: 291 10*3/uL (ref 150–400)
RBC: 3.73 MIL/uL — ABNORMAL LOW (ref 3.87–5.11)
RDW: 13.2 % (ref 11.5–15.5)
WBC: 11.8 10*3/uL — ABNORMAL HIGH (ref 4.0–10.5)
nRBC: 0 % (ref 0.0–0.2)

## 2023-10-22 NOTE — Progress Notes (Signed)
Physical Therapy Treatment Patient Details Name: Lori Edwards MRN: [id number] DOB: 1928/12/27 Today's Date: 10/22/2023   History of Present Illness Pt is a 89 y.o. female s/p L IM nailing of L hip on 10/20/23.    PT Comments  Pt in bathroom with tech upon arrival.  Took over care.  She is able to walk around bed to chair with RW and cga/min a x 1 for some imbalances and increased discomfort today.  She declined further gait but does do seated ex and remains up in chair for breakfast tray that arrives.   If plan is discharge home, recommend the following: A little help with walking and/or transfers;A little help with bathing/dressing/bathroom;Assistance with cooking/housework;Assist for transportation;Help with stairs or ramp for entrance   Can travel by private vehicle        Equipment Recommendations       Recommendations for Other Services       Precautions / Restrictions Precautions Precautions: Fall Restrictions Weight Bearing Restrictions: Yes LLE Weight Bearing: Weight bearing as tolerated     Mobility  Bed Mobility               General bed mobility comments: in bathroom with tech upon arrival Patient Response: Cooperative  Transfers Overall transfer level: Needs assistance Equipment used: Rolling walker (2 wheels) Transfers: Sit to/from Stand Sit to Stand: Contact guard assist                Ambulation/Gait Ambulation/Gait assistance: Contact guard assist, Min assist Gait Distance (Feet): 25 Feet Assistive device: Rolling walker (2 wheels) Gait Pattern/deviations: Step-to pattern, Antalgic Gait velocity: dec     General Gait Details: reports some increased discomfort today   Stairs             Wheelchair Mobility     Tilt Bed Tilt Bed Patient Response: Cooperative  Modified Rankin (Stroke Patients Only)       Balance Overall balance assessment: Needs assistance Sitting-balance support: Feet supported Sitting  balance-Leahy Scale: Good     Standing balance support: Bilateral upper extremity supported Standing balance-Leahy Scale: Fair Standing balance comment: reliant on RW and +1 for safety                            Cognition Arousal: Alert Behavior During Therapy: WFL for tasks assessed/performed Overall Cognitive Status: Within Functional Limits for tasks assessed                                          Exercises Other Exercises Other Exercises: seated AROM BLE    General Comments        Pertinent Vitals/Pain Pain Assessment Pain Assessment: Faces Faces Pain Scale: Hurts even more Pain Location: L hip Pain Descriptors / Indicators: Discomfort Pain Intervention(s): Limited activity within patient's tolerance, Monitored during session, Repositioned    Home Living                          Prior Function            PT Goals (current goals can now be found in the care plan section) Progress towards PT goals: Progressing toward goals    Frequency    7X/week      PT Plan      Co-evaluation  AM-PAC PT "6 Clicks" Mobility   Outcome Measure  Help needed turning from your back to your side while in a flat bed without using bedrails?: A Little Help needed moving from lying on your back to sitting on the side of a flat bed without using bedrails?: A Lot Help needed moving to and from a bed to a chair (including a wheelchair)?: A Little Help needed standing up from a chair using your arms (e.g., wheelchair or bedside chair)?: A Little Help needed to walk in hospital room?: A Little Help needed climbing 3-5 steps with a railing? : A Lot 6 Click Score: 16    End of Session Equipment Utilized During Treatment: Gait belt Activity Tolerance: Patient tolerated treatment well Patient left: in chair;with call bell/phone within reach;with chair alarm set Nurse Communication: Mobility status PT Visit Diagnosis:  Unsteadiness on feet (R26.81);Muscle weakness (generalized) (M62.81);Difficulty in walking, not elsewhere classified (R26.2);Pain Pain - Right/Left: Left Pain - part of body: Hip     Time: 6295-2841 PT Time Calculation (min) (ACUTE ONLY): 10 min  Charges:    $Gait Training: 8-22 mins PT General Charges $$ ACUTE PT VISIT: 1 Visit                   Danielle Dess, PTA 10/22/23, 1:12 PM

## 2023-10-22 NOTE — Progress Notes (Signed)
  Subjective: 2 Days Post-Op Procedure(s) (LRB): INTRAMEDULLARY (IM) NAIL INTERTROCHANTERIC (Left) Patient reports pain as mild.   Patient is well, and has had no acute complaints or problems Plan is to go Rehab versus home after hospital stay. Negative for chest pain and shortness of breath Fever: no Gastrointestinal: Negative for nausea and vomiting  Objective: Vital signs in last 24 hours: Temp:  [97.8 F (36.6 C)-99 F (37.2 C)] 97.8 F (36.6 C) (11/02 2241) Pulse Rate:  [75-82] 80 (11/02 2241) Resp:  [16] 16 (11/02 2241) BP: (151-158)/(64-67) 158/67 (11/02 2241) SpO2:  [97 %-99 %] 99 % (11/02 2241)  Intake/Output from previous day:  Intake/Output Summary (Last 24 hours) at 10/22/2023 0655 Last data filed at 10/21/2023 1221 Gross per 24 hour  Intake --  Output 250 ml  Net -250 ml    Intake/Output this shift: No intake/output data recorded.  Labs: Recent Labs    10/19/23 2149 10/20/23 0309 10/21/23 0320 10/22/23 0423  HGB 13.9 13.6 12.0 11.5*   Recent Labs    10/21/23 0320 10/22/23 0423  WBC 12.5* 11.8*  RBC 3.89 3.73*  HCT 34.3* 32.9*  PLT 276 291   Recent Labs    10/21/23 0320 10/22/23 0423  NA 133* 135  K 3.6 3.8  CL 102 104  CO2 22 25  BUN 18 23  CREATININE 0.81 0.74  GLUCOSE 145* 128*  CALCIUM 8.5* 8.5*   No results for input(s): "LABPT", "INR" in the last 72 hours.   EXAM General - Patient is Alert and Oriented Extremity - Sensation intact distally Dorsiflexion/Plantar flexion intact Compartment soft Dressing/Incision - blood tinged drainage with hemorrhage from the proximal and middle incision.  Continued slow drainage.  Dressing was changed at 3 AM. Motor Function - intact, moving foot and toes well on exam.  The patient ambulated 38 feet with physical therapy  Past Medical History:  Diagnosis Date   Hyperlipidemia    Hypertension     Assessment/Plan: 2 Days Post-Op Procedure(s) (LRB): INTRAMEDULLARY (IM) NAIL  INTERTROCHANTERIC (Left) Principal Problem:   Displaced comminuted fracture of shaft of left femur, initial encounter for closed fracture [facility]) Active Problems:   Essential hypertension   Dyslipidemia   Closed displaced comminuted fracture of shaft of left femur (HCC)   Fall  Estimated body mass index is 32.15 kg/m as calculated from the following:   Height as of this encounter: 4\' 11"  (1.499 m).   Weight as of this encounter: 72.2 kg. Advance diet Up with therapy  Discharge planning: Patient will discharge to home versus rehab after completing physical therapy goals.  Plan to follow-up at [facility] clinic orthopedics in 2 weeks for staple removal and x-rays of the left hip.  DVT Prophylaxis - TED hose and Lovenox Weight-Bearing as tolerated to left leg  Dedra Skeens, PA-C Orthopaedic Surgery 10/22/2023, 6:55 AM

## 2023-10-22 NOTE — Progress Notes (Signed)
  Progress Note   Patient: Lori Edwards VOZ:[id number] DOB: [birth year] DOA: 10/19/2023     3 DOS: the patient was seen and examined on 10/22/2023   Brief hospital course:  HPI on admission 10/19/23:  "Lori Edwards is a 89 y.o. female with medical history significant for hypertension and dyslipidemia, who presented to the emergency room with acute onset of mechanical fall down marble staircase resulting in a deformity and severe pain of the left thigh.  She was unable to bear any weight on the left side increased her pain 8/10 in severity after being given fentanyl by EMS.  Her left leg was shortened and externally rotated upon arrival to the emergency room.  ..." See H&P for full HPI on admission and ED course.  Patient was found to have a left mid-shaft displaced femur fracture, and was admitted to medicine service with orthopedic surgery consulted.  11/1 -- pt taken to OR for surgical repair with Dr. Allena Katz.  Stable clinical course post-operatively. Pt awaiting SNF/rehab placement. Medically stable for discharge.   Assessment and Plan: * Displaced comminuted fracture of shaft of left femur, initial encounter for closed fracture (HCC) Due to mechanical fall --Orthopedic surgery managing --s/p IM nail fixation on 10/20/23 with Dr. Allena Katz --Pain control and bowel regimen --PT/OT evaluations for d/c planning --TOC following  --Further recs per Ortho: WBAT on left leg, Lovenox for DVT ppx, follow up in 2 weeks for staple removal and x-rays  Essential hypertension On home amlodipine  Fall Resulting in femur fracture. Fall precautions PT/OT   Closed displaced comminuted fracture of shaft of left femur (HCC) .  Dyslipidemia diet managed, not on medication        Subjective: Pt seen up in recliner this AM.  She reports moderate pain 4-5 out of 10 with going bed to chair this AM.  Pain controlled on Tylenol.  Pt denies other complaints.      Physical Exam: Vitals:    10/21/23 0816 10/21/23 1542 10/21/23 2241 10/22/23 0835  BP: (!) 152/65 (!) 151/64 (!) 158/67 (!) 151/61  Pulse: 75 82 80 77  Resp: 16 16 16 16   Temp: 99 F (37.2 C) 98 F (36.7 C) 97.8 F (36.6 C) 97.8 F (36.6 C)  TempSrc:    Oral  SpO2: 97% 99% 99% 98%  Weight:      Height:       General exam: awake, alert, no acute distress HEENT: moist mucus membranes, hearing grossly normal  Respiratory system: on room air, normal respiratory effort. Cardiovascular system: RRR, no pedal edema.   Gastrointestinal system: soft, NT, ND Central nervous system: A&O x 4. no gross focal neurologic deficits, normal speech Extremities: no edema, normal tone Skin: dry, intact, normal temperature Psychiatry: normal mood, congruent affect, judgement and insight appear normal    Data Reviewed:  Notable labs --  WBC 18.1 >> 12.5 >> 11.8 glucose 128, Ca 8.5 otherwise normal CBC and BMP Hbg stable 12 >> 11.5  Family Communication: None present. Pt able to update.    Disposition: Status is: Inpatient Remains inpatient appropriate because: ongoing discharge planning including PT/OT evaluations    Planned Discharge Destination: Skilled nursing facility    Time spent: 35 minutes  Author: Pennie Banter, DO 10/22/2023 2:30 PM  For on call review [email or web address].

## 2023-10-22 NOTE — Plan of Care (Signed)

## 2023-10-22 NOTE — Plan of Care (Signed)
  Problem: Skin Integrity: Goal: Risk for impaired skin integrity will decrease Outcome: Progressing   Problem: Safety: Goal: Ability to remain free from injury will improve Outcome: Progressing   Problem: Pain Management: Goal: General experience of comfort will improve Outcome: Progressing   Problem: Elimination: Goal: Will not experience complications related to bowel motility Outcome: Progressing   Problem: Coping: Goal: Level of anxiety will decrease Outcome: Progressing

## 2023-10-23 DIAGNOSIS — Z9181 History of falling: Secondary | ICD-10-CM | POA: Diagnosis not present

## 2023-10-23 DIAGNOSIS — E785 Hyperlipidemia, unspecified: Secondary | ICD-10-CM | POA: Diagnosis not present

## 2023-10-23 DIAGNOSIS — I10 Essential (primary) hypertension: Secondary | ICD-10-CM | POA: Diagnosis not present

## 2023-10-23 DIAGNOSIS — M810 Age-related osteoporosis without current pathological fracture: Secondary | ICD-10-CM | POA: Diagnosis not present

## 2023-10-23 DIAGNOSIS — N1831 Chronic kidney disease, stage 3a: Secondary | ICD-10-CM | POA: Diagnosis not present

## 2023-10-23 DIAGNOSIS — S72352A Displaced comminuted fracture of shaft of left femur, initial encounter for closed fracture: Secondary | ICD-10-CM | POA: Diagnosis not present

## 2023-10-23 DIAGNOSIS — Z66 Do not resuscitate: Secondary | ICD-10-CM | POA: Diagnosis not present

## 2023-10-23 DIAGNOSIS — M6281 Muscle weakness (generalized): Secondary | ICD-10-CM | POA: Diagnosis not present

## 2023-10-23 DIAGNOSIS — D509 Iron deficiency anemia, unspecified: Secondary | ICD-10-CM | POA: Diagnosis not present

## 2023-10-23 DIAGNOSIS — Z9889 Other specified postprocedural states: Secondary | ICD-10-CM | POA: Diagnosis not present

## 2023-10-23 DIAGNOSIS — K5903 Drug induced constipation: Secondary | ICD-10-CM | POA: Diagnosis not present

## 2023-10-23 DIAGNOSIS — S72352D Displaced comminuted fracture of shaft of left femur, subsequent encounter for closed fracture with routine healing: Secondary | ICD-10-CM | POA: Diagnosis not present

## 2023-10-23 DIAGNOSIS — R110 Nausea: Secondary | ICD-10-CM | POA: Diagnosis not present

## 2023-10-23 DIAGNOSIS — M25552 Pain in left hip: Secondary | ICD-10-CM | POA: Diagnosis not present

## 2023-10-23 DIAGNOSIS — Z8781 Personal history of (healed) traumatic fracture: Secondary | ICD-10-CM | POA: Diagnosis not present

## 2023-10-23 DIAGNOSIS — S72352S Displaced comminuted fracture of shaft of left femur, sequela: Secondary | ICD-10-CM | POA: Diagnosis not present

## 2023-10-23 DIAGNOSIS — R2689 Other abnormalities of gait and mobility: Secondary | ICD-10-CM | POA: Diagnosis not present

## 2023-10-23 DIAGNOSIS — D62 Acute posthemorrhagic anemia: Secondary | ICD-10-CM | POA: Diagnosis not present

## 2023-10-23 DIAGNOSIS — R531 Weakness: Secondary | ICD-10-CM | POA: Diagnosis not present

## 2023-10-23 LAB — BASIC METABOLIC PANEL
Anion gap: 8 (ref 5–15)
BUN: 26 mg/dL — ABNORMAL HIGH (ref 8–23)
CO2: 25 mmol/L (ref 22–32)
Calcium: 8.1 mg/dL — ABNORMAL LOW (ref 8.9–10.3)
Chloride: 100 mmol/L (ref 98–111)
Creatinine, Ser: 0.76 mg/dL (ref 0.44–1.00)
GFR, Estimated: >60 mL/min (ref >60)
Glucose, Bld: 121 mg/dL — ABNORMAL HIGH (ref 70–99)
Potassium: 3.9 mmol/L (ref 3.5–5.1)
Sodium: 133 mmol/L — ABNORMAL LOW (ref 135–145)

## 2023-10-23 LAB — CBC
HCT: 31.8 % — ABNORMAL LOW (ref 36.0–46.0)
Hemoglobin: 11.3 g/dL — ABNORMAL LOW (ref 12.0–15.0)
MCH: 31.8 pg (ref 26.0–34.0)
MCHC: 35.5 g/dL (ref 30.0–36.0)
MCV: 89.6 fL (ref 80.0–100.0)
Platelets: 303 10*3/uL (ref 150–400)
RBC: 3.55 MIL/uL — ABNORMAL LOW (ref 3.87–5.11)
RDW: 13.2 % (ref 11.5–15.5)
WBC: 12.1 10*3/uL — ABNORMAL HIGH (ref 4.0–10.5)
nRBC: 0 % (ref 0.0–0.2)

## 2023-10-23 MED ORDER — DOCUSATE SODIUM 100 MG PO CAPS: 100.0000 mg | ORAL_CAPSULE | Freq: Two times a day (BID) | ORAL | Status: DC

## 2023-10-23 MED ORDER — CHLORHEXIDINE GLUCONATE 4 % EX SOLN: 1.0000 | CUTANEOUS | Status: DC

## 2023-10-23 MED ORDER — SENNOSIDES-DOCUSATE SODIUM 8.6-50 MG PO TABS: 1.0000 | ORAL_TABLET | Freq: Every evening | ORAL | Status: DC | PRN

## 2023-10-23 MED ORDER — METHOCARBAMOL 500 MG PO TABS: 500.0000 mg | ORAL_TABLET | Freq: Four times a day (QID) | ORAL | Status: DC | PRN

## 2023-10-23 MED ORDER — ACETAMINOPHEN 500 MG PO TABS: 1000.0000 mg | ORAL_TABLET | Freq: Three times a day (TID) | ORAL | Status: AC

## 2023-10-23 NOTE — Progress Notes (Signed)
Physical Therapy Treatment Patient Details Name: Lori Edwards MRN: [id number] DOB: 08/25/1929 Today's Date: 10/23/2023   History of Present Illness Pt is a 89 y.o. female s/p L IM nailing of L hip on 10/20/23.    PT Comments  Pt up in chair,  denied pain at rest, feeling better.  She is able to stand with cga/min a x 1 for cues for hand placement.  She is able to progress gait to 40' with min a x 1 and mod a x 1 for verbal cues and assist for walker sequence.  Pt often steps too far into walker box.  Cues for proper hand placement, distance and encouragement to keep to a more step to gait pattern as she often tries to take too big of a step hitting walker bar.  As session progresses gait quality does improve some.  She does ask to use bathroom for small formed BM and general abdominal cramping.  Remained in recliner after session with needs met.   If plan is discharge home, recommend the following: A little help with walking and/or transfers;A little help with bathing/dressing/bathroom;Assistance with cooking/housework;Assist for transportation;Help with stairs or ramp for entrance   Can travel by private vehicle        Equipment Recommendations       Recommendations for Other Services       Precautions / Restrictions Precautions Precautions: Fall Restrictions Weight Bearing Restrictions: Yes LLE Weight Bearing: Weight bearing as tolerated     Mobility  Bed Mobility                 Patient Response: Cooperative  Transfers Overall transfer level: Needs assistance Equipment used: Rolling walker (2 wheels) Transfers: Sit to/from Stand Sit to Stand: Contact guard assist, Min assist                Ambulation/Gait Ambulation/Gait assistance: Contact guard assist, Min assist Gait Distance (Feet): 40 Feet Assistive device: Rolling walker (2 wheels)   Gait velocity: dec     General Gait Details: continued to need min a hands on and mod a for verbal cues for  sequencing and gait pattern   Stairs             Wheelchair Mobility     Tilt Bed Tilt Bed Patient Response: Cooperative  Modified Rankin (Stroke Patients Only)       Balance Overall balance assessment: Needs assistance Sitting-balance support: Feet supported Sitting balance-Leahy Scale: Good     Standing balance support: Bilateral upper extremity supported Standing balance-Leahy Scale: Fair Standing balance comment: reliant on RW and +1 for safety                            Cognition Arousal: Alert Behavior During Therapy: WFL for tasks assessed/performed Overall Cognitive Status: Within Functional Limits for tasks assessed                                          Exercises Other Exercises Other Exercises: bathroom for small formed BM    General Comments        Pertinent Vitals/Pain Pain Assessment Pain Assessment: Faces Faces Pain Scale: Hurts little more Pain Location: L hip - reports improvement today with gait,  denied pain at rest Pain Descriptors / Indicators: Sore Pain Intervention(s): Limited activity within patient's tolerance, Monitored during session,  Repositioned    Home Living                          Prior Function            PT Goals (current goals can now be found in the care plan section) Progress towards PT goals: Progressing toward goals    Frequency    7X/week      PT Plan      Co-evaluation              AM-PAC PT "6 Clicks" Mobility   Outcome Measure  Help needed turning from your back to your side while in a flat bed without using bedrails?: A Little Help needed moving from lying on your back to sitting on the side of a flat bed without using bedrails?: A Lot Help needed moving to and from a bed to a chair (including a wheelchair)?: A Little Help needed standing up from a chair using your arms (e.g., wheelchair or bedside chair)?: A Little Help needed to walk in  hospital room?: A Little Help needed climbing 3-5 steps with a railing? : A Lot 6 Click Score: 16    End of Session Equipment Utilized During Treatment: Gait belt Activity Tolerance: Patient tolerated treatment well Patient left: in chair;with call bell/phone within reach;with chair alarm set Nurse Communication: Mobility status PT Visit Diagnosis: Unsteadiness on feet (R26.81);Muscle weakness (generalized) (M62.81);Difficulty in walking, not elsewhere classified (R26.2);Pain Pain - Right/Left: Left Pain - part of body: Hip     Time: 9528-4132 PT Time Calculation (min) (ACUTE ONLY): 29 min  Charges:    $Gait Training: 8-22 mins $Therapeutic Activity: 8-22 mins PT General Charges $$ ACUTE PT VISIT: 1 Visit                   Danielle Dess, PTA 10/23/23, 11:03 AM

## 2023-10-23 NOTE — Plan of Care (Signed)

## 2023-10-23 NOTE — TOC Progression Note (Signed)
Transition of Care [facility]) - Progression Note    Patient Details  Name: Lori Edwards MRN: [id number] Date of Birth: 06/01/1929  Transition of Care [facility]) CM/SW Contact  Marlowe Sax, RN Phone Number: 10/23/2023, 1:33 PM  Clinical Narrative:     [name] at [facility] of the [employer] number  Expected Discharge Plan: Skilled Nursing Facility Barriers to Discharge: No Barriers Identified  Expected Discharge Plan and Services   Discharge Planning Services: CM Consult Post Acute Care Choice: Skilled Nursing Facility Living arrangements for the past 2 months: Independent Living Facility                 DME Arranged: N/A DME Agency: NA       HH Arranged: NA HH Agency: NA         Social Determinants of Health (SDOH) Interventions SDOH Screenings   Food Insecurity: No Food Insecurity (10/20/2023)  Housing: Patient Declined (10/20/2023)  Transportation Needs: No Transportation Needs (10/20/2023)  Utilities: Not At Risk (10/20/2023)  Depression (PHQ2-9): Low Risk  (09/26/2023)  Financial Resource Strain: Low Risk  (07/12/2018)  Physical Activity: Unknown (09/13/2019)  Stress: No Stress Concern Present (07/12/2018)  Tobacco Use: Low Risk  (10/20/2023)    Readmission Risk Interventions     No data to display

## 2023-10-23 NOTE — TOC Progression Note (Signed)
Transition of Care [facility]) - Progression Note    Patient Details  Name: Lori Edwards MRN: [id number] Date of Birth: 06-Mar-1929  Transition of Care [facility]) CM/SW Contact  Marlowe Sax, RN Phone Number: 10/23/2023, 2:32 PM  Clinical Narrative:    Allie Bossier and notified him that the patient is discharging to room 104 at [facility]  EMS called and they put her on the transport list   Expected Discharge Plan: Skilled Nursing Facility Barriers to Discharge: No Barriers Identified  Expected Discharge Plan and Services   Discharge Planning Services: CM Consult Post Acute Care Choice: Skilled Nursing Facility Living arrangements for the past 2 months: Independent Living Facility Expected Discharge Date: 10/23/23               DME Arranged: N/A DME Agency: NA       HH Arranged: NA HH Agency: NA         Social Determinants of Health (SDOH) Interventions SDOH Screenings   Food Insecurity: No Food Insecurity (10/20/2023)  Housing: Patient Declined (10/20/2023)  Transportation Needs: No Transportation Needs (10/20/2023)  Utilities: Not At Risk (10/20/2023)  Depression (PHQ2-9): Low Risk  (09/26/2023)  Financial Resource Strain: Low Risk  (07/12/2018)  Physical Activity: Unknown (09/13/2019)  Stress: No Stress Concern Present (07/12/2018)  Tobacco Use: Low Risk  (10/20/2023)    Readmission Risk Interventions     No data to display

## 2023-10-23 NOTE — Care Management Important Message (Signed)
Important Message  Patient Details  Name: Lori Edwards MRN: [id number] Date of Birth: 1929/11/23   Important Message Given:  Yes - Medicare IM     Olegario Messier A Zakaree Mcclenahan 10/23/2023, 11:43 AM

## 2023-10-23 NOTE — Consult Note (Signed)
[facility] Liaison Note  10/23/2023  Lori Edwards 1929-08-15 [id number]  Location: RN Hospital Liaison screened the patient remotely at [facility].  Pt was screened due to Celene Kras for [employer]. Pt is at [facility] in Independent Living.  Insurance: Medicare   Lori Edwards is a 89 y.o. female who is a Primary Care Patient of Sharon Seller, NP The patient was screened for readmission hospitalization with noted low risk score for unplanned readmission risk with 1 IP in 6 months.  The patient was assessed for potential Care Management service needs for post hospital transition for care coordination. Review of patient's electronic medical record reveals patient was admitted for Displaced Comminuted fracture of shaft left femur. Pt is a resident at [facility] independent living. Pt will d/c to [facility] for SNF then return to her residence at [facility]. No anticipated needs via VBCI for any community. Facility will address pt's ongoing needs post hospital discharge.   VBCI Care Management/Population Health does not replace or interfere with any arrangements made by the Inpatient Transition of Care team.   For questions contact:   Elliot Cousin, RN, [facility] Liaison [place]   [facility], Population Health Office Hours MTWF  8:00 am-6:00 pm Direct Dial: [phone] mobile [phone] [Office toll free line] Office Hours are M-F 8:30 - 5 pm [email or web address]

## 2023-10-23 NOTE — Discharge Summary (Signed)
Physician Discharge Summary   Patient: Lori Edwards MRN: [id number] DOB: 1929/01/02  Admit date:     10/19/2023  Discharge date: 10/23/23  Discharge Physician: Pennie Banter   PCP: Sharon Seller, NP   Recommendations at discharge:    Follow up with Orthopedic Surgery Follow up with Primary Care Repeat CBC, BMP at follow up Weight-bearing as tolerated on left leg Lovenox for DVT prevention as prescribed  Discharge Diagnoses: Principal Problem:   Displaced comminuted fracture of shaft of left femur, initial encounter for closed fracture [facility]) Active Problems:   Essential hypertension   Dyslipidemia   Closed displaced comminuted fracture of shaft of left femur (HCC)   Fall  Resolved Problems:   * No resolved hospital problems. [facility] Course:  HPI on admission 10/19/23:  "Lori Edwards is a 89 y.o. female with medical history significant for hypertension and dyslipidemia, who presented to the emergency room with acute onset of mechanical fall down marble staircase resulting in a deformity and severe pain of the left thigh.  She was unable to bear any weight on the left side increased her pain 8/10 in severity after being given fentanyl by EMS.  Her left leg was shortened and externally rotated upon arrival to the emergency room.  ..." See H&P for full HPI on admission and ED course.   Patient was found to have a left mid-shaft displaced femur fracture, and was admitted to medicine service with orthopedic surgery consulted.   11/1 -- pt taken to OR for surgical repair with Dr. Allena Katz.   Stable clinical course post-operatively. Pt awaiting SNF/rehab placement. Medically stable for discharge.  Assessment and Plan: * Displaced comminuted fracture of shaft of left femur, initial encounter for closed fracture (HCC) Due to mechanical fall --Orthopedic surgery managing --s/p IM nail fixation on 10/20/23 with Dr. Allena Katz --Pain control and bowel regimen --PT/OT  evaluations for d/c planning --TOC following  --Further recs per Ortho: WBAT on left leg, Lovenox for DVT ppx, follow up in 2 weeks for staple removal and x-rays  Essential hypertension On home amlodipine  Fall Resulting in femur fracture. Fall precautions PT/OT   Closed displaced comminuted fracture of shaft of left femur (HCC) .  Dyslipidemia diet managed, not on medication         Consultants: Orthopedic surgery   Procedures performed: IM nail fixation as above    Disposition: Skilled nursing facility  Diet recommendation:  Regular diet   DISCHARGE MEDICATION: Allergies as of 10/23/2023   No Known Allergies      Medication List     TAKE these medications    acetaminophen 500 MG tablet Commonly known as: TYLENOL Take 2 tablets (1,000 mg total) by mouth every 8 (eight) hours.   amLODipine 5 MG tablet Commonly known as: NORVASC Take 1 tablet (5 mg total) by mouth daily.   chlorhexidine 4 % external liquid Commonly known as: HIBICLENS Apply 15 mLs (1 Application total) topically as directed for 30 doses. Use as directed daily for 5 days every other week for 6 weeks.   cholecalciferol 25 MCG (1000 UNIT) tablet Commonly known as: VITAMIN D3 Take 1,000 Units by mouth daily.   docusate sodium 100 MG capsule Commonly known as: COLACE Take 1 capsule (100 mg total) by mouth 2 (two) times daily.   enoxaparin 40 MG/0.4ML injection Commonly known as: LOVENOX Inject 0.4 mLs (40 mg total) into the skin daily for 28 days.   methocarbamol 500 MG tablet Commonly known  as: ROBAXIN Take 1 tablet (500 mg total) by mouth every 6 (six) hours as needed for muscle spasms.   multivitamin with minerals Tabs tablet Take 1 tablet by mouth daily.   mupirocin ointment 2 % Commonly known as: BACTROBAN Place 1 Application into the nose 2 (two) times daily for 60 doses. Use as directed 2 times daily for 5 days every other week for 6 weeks.   ondansetron 4 MG  tablet Commonly known as: ZOFRAN Take 1 tablet (4 mg total) by mouth every 6 (six) hours as needed for nausea.   oxyCODONE 5 MG immediate release tablet Commonly known as: Oxy IR/ROXICODONE Take 0.5-1 tablets (2.5-5 mg total) by mouth every 4 (four) hours as needed for moderate pain (pain score 4-6) (pain score 4-6).   Reclast 5 MG/100ML Soln injection Generic drug: zoledronic acid Inject 5 mg into the vein as directed. Once yearly   senna-docusate 8.6-50 MG tablet Commonly known as: Senokot-S Take 1 tablet by mouth at bedtime as needed for mild constipation.        Follow-up Information     Dedra Skeens, PA-C Follow up in 2 week(s).   Specialty: Orthopedic Surgery Why: For staple removal and x-rays of the left hip Contact information: [address] [place] [address] [phone]                Discharge Exam: Ceasar Mons Weights   10/20/23 2052  Weight: 72.2 kg   General exam: awake, alert, no acute distress HEENT: atraumatic, clear conjunctiva, anicteric sclera, moist mucus membranes, hearing grossly normal  Respiratory system: CTA, no wheezes, rales or rhonchi, normal respiratory effort. Cardiovascular system: normal S1/S2, RRR,no pedal edema.   Gastrointestinal system: soft, NT, ND, no HSM felt, +bowel sounds. Central nervous system: A&O x 3. no gross focal neurologic deficits, normal speech Extremities: moves all, no edema, normal tone Skin: dry, intact, normal temperature, normal color, No rashes, lesions or ulcers Psychiatry: normal mood, congruent affect, judgement and insight appear normal   Condition at discharge: stable  The results of significant diagnostics from this hospitalization (including imaging, microbiology, ancillary and laboratory) are listed below for reference.   Imaging Studies: DG HIP UNILAT WITH PELVIS 2-3 VIEWS LEFT  Result Date: 10/20/2023 CLINICAL DATA:  Left femoral nail EXAM: DG HIP (WITH OR WITHOUT  PELVIS) 2-3V LEFT COMPARISON:  10/19/2023 FINDINGS: Intraoperative imaging demonstrates placement of hip screws and intramedullary nail across the proximal left femoral fracture. Anatomic alignment. No hardware complicating feature. IMPRESSION: Internal fixation without visible complicating feature. Electronically Signed   By: Charlett Nose M.D.   On: 10/20/2023 18:08   DG C-Arm 1-60 Min-No Report  Result Date: 10/20/2023 Fluoroscopy was utilized by the requesting physician.  No radiographic interpretation.   DG Femur Min 2 Views Left  Result Date: 10/20/2023 CLINICAL DATA:  Fall with fracture EXAM: LEFT FEMUR 2 VIEWS COMPARISON:  None Available. FINDINGS: Acute comminuted fracture involving the proximal shaft of the femur with apex anterior angulation and close to 1 shaft diameter posterior placement of distal fracture fragment. Vascular calcifications. IMPRESSION: Acute comminuted, displaced and angulated proximal femoral shaft fracture Electronically Signed   By: Jasmine Pang M.D.   On: 10/20/2023 00:09   DG Chest 1 View  Result Date: 10/19/2023 CLINICAL DATA:  Fall with hip fracture EXAM: CHEST  1 VIEW COMPARISON:  07/31/2017 FINDINGS: No acute airspace disease or pleural effusion. Stable cardiomediastinal silhouette with aortic atherosclerosis. No pneumothorax. IMPRESSION: No active disease. Electronically Signed  By: Jasmine Pang M.D.   On: 10/19/2023 23:12   DG Hip Unilat W or Wo Pelvis 2-3 Views Left  Result Date: 10/19/2023 CLINICAL DATA:  Fall with hip deformity EXAM: DG HIP (WITH OR WITHOUT PELVIS) 2-3V LEFT COMPARISON:  None Available. FINDINGS: SI joints are non widened. Pubic symphysis appears intact. Acute comminuted fracture involving the subtrochanteric femoral diaphysis with moderate apex lateral angulation and about 1/2 shaft diameter central and greater than 1 shaft diameter posterior displacement of distal fracture fragment. Left femoral head projects in joint. Probable old  fracture deformities of the left superior and inferior pubic rami. IMPRESSION: Acute comminuted displaced and angulated subtrochanteric left femoral diaphyseal fracture. Electronically Signed   By: Jasmine Pang M.D.   On: 10/19/2023 23:11    Microbiology: Results for orders placed or performed during the hospital encounter of 10/19/23  Surgical PCR screen     Status: Abnormal   Collection Time: 10/20/23  8:24 AM   Specimen: Nasal Mucosa; Nasal Swab  Result Value Ref Range Status   MRSA, PCR POSITIVE (A) NEGATIVE Final    Comment: RESULT CALLED TO, READ BACK BY AND VERIFIED WITH: LAURIE HAWKINS AT 1318 10/20/23.PMF    Staphylococcus aureus POSITIVE (A) NEGATIVE Final    Comment: (NOTE) The Xpert SA Assay (FDA approved for NASAL specimens in patients 97 years of age and older), is one component of a comprehensive surveillance program. It is not intended to diagnose infection nor to guide or monitor treatment. Performed at [facility], [address] [place], [place] [address]     Labs: CBC: Recent Labs  Lab 10/19/23 2149 10/20/23 0309 10/21/23 0320 10/22/23 0423 10/23/23 0516  WBC 13.3* 18.1* 12.5* 11.8* 12.1*  NEUTROABS 6.1  --   --   --   --   HGB 13.9 13.6 12.0 11.5* 11.3*  HCT 40.7 39.7 34.3* 32.9* 31.8*  MCV 91.7 91.5 88.2 88.2 89.6  PLT 331 310 276 291 303   Basic Metabolic Panel: Recent Labs  Lab 10/19/23 2149 10/20/23 0309 10/21/23 0320 10/22/23 0423 10/23/23 0516  NA 138 137 133* 135 133*  K 3.3* 3.6 3.6 3.8 3.9  CL 104 102 102 104 100  CO2 26 24 22 25 25   GLUCOSE 140* 171* 145* 128* 121*  BUN 20 16 18 23  26*  CREATININE 0.81 0.75 0.81 0.74 0.76  CALCIUM 9.0 8.9 8.5* 8.5* 8.1*   Liver Function Tests: Recent Labs  Lab 10/19/23 2149  AST 30  ALT 26  ALKPHOS 68  BILITOT 0.7  PROT 8.1  ALBUMIN 4.2   CBG: No results for input(s): "GLUCAP" in the last 168 hours.  Discharge time spent: less than 30 minutes.  Signed: Pennie Banter, DO [facility] 10/23/2023

## 2023-10-23 NOTE — Progress Notes (Signed)
  Subjective: 3 Days Post-Op Procedure(s) (LRB): INTRAMEDULLARY (IM) NAIL INTERTROCHANTERIC (Left) Patient reports pain as mild.   Patient is well, and has had no acute complaints or problems Plan is to go Rehab versus home after hospital stay. Negative for chest pain and shortness of breath Fever: no Gastrointestinal: Negative for nausea and vomiting  Objective: Vital signs in last 24 hours: Temp:  [97.8 F (36.6 C)-98.2 F (36.8 C)] 98.2 F (36.8 C) (11/03 2237) Pulse Rate:  [71-83] 71 (11/03 2237) Resp:  [16-17] 17 (11/03 2237) BP: (131-154)/(61-72) 131/72 (11/03 2237) SpO2:  [98 %-99 %] 99 % (11/03 2237)  Intake/Output from previous day: No intake or output data in the 24 hours ending 10/23/23 0710   Intake/Output this shift: No intake/output data recorded.  Labs: Recent Labs    10/21/23 0320 10/22/23 0423 10/23/23 0516  HGB 12.0 11.5* 11.3*   Recent Labs    10/22/23 0423 10/23/23 0516  WBC 11.8* 12.1*  RBC 3.73* 3.55*  HCT 32.9* 31.8*  PLT 291 303   Recent Labs    10/22/23 0423 10/23/23 0516  NA 135 133*  K 3.8 3.9  CL 104 100  CO2 25 25  BUN 23 26*  CREATININE 0.74 0.76  GLUCOSE 128* 121*  CALCIUM 8.5* 8.1*   No results for input(s): "LABPT", "INR" in the last 72 hours.   EXAM General - Patient is Alert and Oriented Extremity - Sensation intact distally Dorsiflexion/Plantar flexion intact Compartment soft Dressing/Incision - blood tinged drainage with hemorrhage from the proximal and middle incision.  Continued slow drainage.  Dressing was changed at 3 AM. Motor Function - intact, moving foot and toes well on exam.  The patient ambulated 25 feet with physical therapy  Past Medical History:  Diagnosis Date   Hyperlipidemia    Hypertension     Assessment/Plan: 3 Days Post-Op Procedure(s) (LRB): INTRAMEDULLARY (IM) NAIL INTERTROCHANTERIC (Left) Principal Problem:   Displaced comminuted fracture of shaft of left femur, initial encounter  for closed fracture [facility]) Active Problems:   Essential hypertension   Dyslipidemia   Closed displaced comminuted fracture of shaft of left femur (HCC)   Fall  Estimated body mass index is 32.15 kg/m as calculated from the following:   Height as of this encounter: 4\' 11"  (1.499 m).   Weight as of this encounter: 72.2 kg. Advance diet Up with therapy  Discharge planning: Patient will discharge to rehab after completing physical therapy goals.  Plan to follow-up at [facility] orthopedics in 2 weeks for staple removal and x-rays of the left hip.  DVT Prophylaxis - TED hose and Lovenox Weight-Bearing as tolerated to left leg  Dedra Skeens, PA-C Orthopaedic Surgery 10/23/2023, 7:10 AM

## 2023-10-23 NOTE — Progress Notes (Deleted)
  Progress Note   Patient: Lori Edwards:[id number] DOB: Apr 28, 1929 DOA: 10/19/2023     4 DOS: the patient was seen and examined on 10/23/2023   Brief hospital course:  HPI on admission 10/19/23:  "Lori Edwards is a 89 y.o. female with medical history significant for hypertension and dyslipidemia, who presented to the emergency room with acute onset of mechanical fall down marble staircase resulting in a deformity and severe pain of the left thigh.  She was unable to bear any weight on the left side increased her pain 8/10 in severity after being given fentanyl by EMS.  Her left leg was shortened and externally rotated upon arrival to the emergency room.  ..." See H&P for full HPI on admission and ED course.  Patient was found to have a left mid-shaft displaced femur fracture, and was admitted to medicine service with orthopedic surgery consulted.  11/1 -- pt taken to OR for surgical repair with Dr. Allena Katz.  Stable clinical course post-operatively. Pt awaiting SNF/rehab placement. Medically stable for discharge.   Assessment and Plan: * Displaced comminuted fracture of shaft of left femur, initial encounter for closed fracture (HCC) Due to mechanical fall --Orthopedic surgery managing --s/p IM nail fixation on 10/20/23 with Dr. Allena Katz --Pain control and bowel regimen --PT/OT evaluations for d/c planning --TOC following  --Further recs per Ortho: WBAT on left leg, Lovenox for DVT ppx, follow up in 2 weeks for staple removal and x-rays  Essential hypertension On home amlodipine  Fall Resulting in femur fracture. Fall precautions PT/OT   Closed displaced comminuted fracture of shaft of left femur (HCC) .  Dyslipidemia diet managed, not on medication        Subjective: Pt seen up in recliner this AM.  Seen earlier ambulating with PT using walker from bathroom to chair.  Pt reprots mild pain, 2 out of 10.  Denies any other complaints.       Physical Exam: Vitals:    10/22/23 0835 10/22/23 1640 10/22/23 2237 10/23/23 0857  BP: (!) 151/61 (!) 154/67 131/72 124/62  Pulse: 77 83 71 77  Resp: 16  17 16   Temp: 97.8 F (36.6 C) 97.9 F (36.6 C) 98.2 F (36.8 C) 99.6 F (37.6 C)  TempSrc: Oral     SpO2: 98% 99% 99% 97%  Weight:      Height:       General exam: awake, alert, no acute distress HEENT: moist mucus membranes, hearing grossly normal  Respiratory system: on room air, normal respiratory effort. CTAB no wheezes or rhonchi Cardiovascular system: RRR, no pedal edema.  +S1S2 Gastrointestinal system: soft, NT, ND Central nervous system: A&O x 4. no gross focal neurologic deficits, normal speech Extremities: no edema, normal tone Skin: dry, intact, normal temperature Psychiatry: normal mood, congruent affect, judgement and insight appear normal    Data Reviewed:  Notable labs --  WBC 18.1 >> 12.5 >> 11.8 >> 12.1 Na 133, glucose 121, Ca 8.1 otherwise normal CBC and BMP Hbg stable 12 >> 11.5  Family Communication: None present. Pt able to update.    Disposition: Status is: Inpatient Remains inpatient appropriate because: awaiting SNF placement. Medically stable.    Planned Discharge Destination: Skilled nursing facility    Time spent: 35 minutes  Author: Pennie Banter, DO 10/23/2023 1:43 PM  For on call review [email or web address].

## 2023-10-23 NOTE — Plan of Care (Signed)
  Problem: Health Behavior/Discharge Planning: Goal: Ability to manage health-related needs will improve Outcome: Progressing   Problem: Activity: Goal: Risk for activity intolerance will decrease Outcome: Progressing   Problem: Nutrition: Goal: Adequate nutrition will be maintained Outcome: Progressing   Problem: Elimination: Goal: Will not experience complications related to bowel motility Outcome: Progressing Goal: Will not experience complications related to urinary retention Outcome: Progressing   Problem: Pain Management: Goal: General experience of comfort will improve Outcome: Progressing   Problem: Safety: Goal: Ability to remain free from injury will improve Outcome: Progressing

## 2023-10-24 ENCOUNTER — Other Ambulatory Visit: Payer: Self-pay | Admitting: Adult Health

## 2023-10-24 ENCOUNTER — Encounter: Payer: Self-pay | Admitting: Nurse Practitioner

## 2023-10-24 ENCOUNTER — Non-Acute Institutional Stay (SKILLED_NURSING_FACILITY): Payer: Self-pay | Admitting: Nurse Practitioner

## 2023-10-24 DIAGNOSIS — I10 Essential (primary) hypertension: Secondary | ICD-10-CM

## 2023-10-24 DIAGNOSIS — K5903 Drug induced constipation: Secondary | ICD-10-CM | POA: Diagnosis not present

## 2023-10-24 DIAGNOSIS — S72352D Displaced comminuted fracture of shaft of left femur, subsequent encounter for closed fracture with routine healing: Secondary | ICD-10-CM | POA: Diagnosis not present

## 2023-10-24 DIAGNOSIS — D509 Iron deficiency anemia, unspecified: Secondary | ICD-10-CM

## 2023-10-24 DIAGNOSIS — M810 Age-related osteoporosis without current pathological fracture: Secondary | ICD-10-CM | POA: Diagnosis not present

## 2023-10-24 DIAGNOSIS — R110 Nausea: Secondary | ICD-10-CM | POA: Diagnosis not present

## 2023-10-24 DIAGNOSIS — R52 Pain, unspecified: Secondary | ICD-10-CM

## 2023-10-24 MED ORDER — OXYCODONE HCL 5 MG PO TABS
2.5000 mg | ORAL_TABLET | ORAL | 0 refills | Status: DC | PRN
Start: 2023-10-24 — End: 2023-10-31

## 2023-10-24 NOTE — Progress Notes (Signed)
Nursing Home Location:    [facility] [place].   Place of Service: SNF (31)  PCP: Sharon Seller, NP  No Known Allergies  Chief Complaint  Patient presents with   New Admit To SNF    Admission.     HPI:  Patient is a 89 y.o. female seen today at  [facility] for Admission.  She was at the theater and fell down to the marble steps and suffered left mid-shaft displaced femur fracture, and was admitted to medicine service with orthopedic surgery consulted.  Underwent surgical repair on 11/1 now at [facility] for short term rehab Pain is controlled on tylenol- Reports she is having a lot of nausea and abdominal cramps- feels like she needs to have a good BM She is having small BMs at a time.  On a softener  Reports her stomach is empty and that hurts  She is taking zofran which helps.  Gingerale helping as well.   Review of Systems:  Review of Systems  Constitutional:  Negative for activity change, appetite change, fatigue and unexpected weight change.  HENT:  Negative for congestion and hearing loss.   Eyes: Negative.   Respiratory:  Negative for cough and shortness of breath.   Cardiovascular:  Negative for chest pain, palpitations and leg swelling.  Gastrointestinal:  Positive for abdominal pain (cramping) and constipation. Negative for diarrhea.  Genitourinary:  Negative for difficulty urinating and dysuria.  Musculoskeletal:  Positive for arthralgias and myalgias.  Skin:  Negative for color change and wound.  Neurological:  Negative for dizziness and weakness.  Psychiatric/Behavioral:  Negative for agitation, behavioral problems and confusion.     Past Medical History:  Diagnosis Date   Hyperlipidemia    Hypertension    Past Surgical History:  Procedure Laterality Date   INTRAMEDULLARY (IM) NAIL INTERTROCHANTERIC Left 10/20/2023   Procedure: INTRAMEDULLARY (IM) NAIL INTERTROCHANTERIC;  Surgeon: Signa Kell, MD;  Location: [facility] ORS;  Service:  Orthopedics;  Laterality: Left;   OPEN REDUCTION INTERNAL FIXATION (ORIF) DISTAL RADIAL FRACTURE Right 07/31/2017   Procedure: OPEN REDUCTION INTERNAL FIXATION (ORIF) DISTAL RADIAL FRACTURE;  Surgeon: Deeann Saint, MD;  Location: [facility] ORS;  Service: Orthopedics;  Laterality: Right;   Social History:   reports that she has never smoked. She has never used smokeless tobacco. She reports that she does not drink alcohol and does not use drugs.  Family History  Problem Relation Age of Onset   Stroke Other        Parent   Stroke Mother    Hypertension Mother    Hypertension Father     Medications: Patient's Medications  New Prescriptions   No medications on file  Previous Medications   ACETAMINOPHEN (TYLENOL) 500 MG TABLET    Take 2 tablets (1,000 mg total) by mouth every 8 (eight) hours.   AMLODIPINE (NORVASC) 5 MG TABLET    Take 1 tablet (5 mg total) by mouth daily.   CHLORHEXIDINE (HIBICLENS) 4 % EXTERNAL LIQUID    Apply 15 mLs (1 Application total) topically as directed for 30 doses. Use as directed daily for 5 days every other week for 6 weeks.   CHOLECALCIFEROL (VITAMIN D3) 25 MCG (1000 UT) TABLET    Take 1,000 Units by mouth daily.   DOCUSATE SODIUM (COLACE) 100 MG CAPSULE    Take 1 capsule (100 mg total) by mouth 2 (two) times daily.   ENOXAPARIN (LOVENOX) 40 MG/0.4ML INJECTION    Inject 0.4 mLs (40 mg total)  into the skin daily for 28 days.   METHOCARBAMOL (ROBAXIN) 500 MG TABLET    Take 1 tablet (500 mg total) by mouth every 6 (six) hours as needed for muscle spasms.   MULTIPLE VITAMIN (MULTIVITAMIN WITH MINERALS) TABS TABLET    Take 1 tablet by mouth daily.   ONDANSETRON (ZOFRAN) 4 MG TABLET    Take 1 tablet (4 mg total) by mouth every 6 (six) hours as needed for nausea.   OXYCODONE (OXY IR/ROXICODONE) 5 MG IMMEDIATE RELEASE TABLET    Take 0.5 tablets (2.5 mg total) by mouth every 4 (four) hours as needed (pain).   SENNA-DOCUSATE (SENOKOT-S) 8.6-50 MG TABLET    Take 1 tablet by  mouth at bedtime as needed for mild constipation.  Modified Medications   No medications on file  Discontinued Medications   MUPIROCIN OINTMENT (BACTROBAN) 2 %    Place 1 Application into the nose 2 (two) times daily for 60 doses. Use as directed 2 times daily for 5 days every other week for 6 weeks.   ZOLEDRONIC ACID (RECLAST) 5 MG/100ML SOLN INJECTION    Inject 5 mg into the vein as directed. Once yearly     Physical Exam: Vitals:   10/24/23 0927 10/24/23 1003  BP: (!) 143/76 115/66  Pulse: 68   Resp: 18   Temp: 97.6 F (36.4 C)   SpO2: 98%   Weight: 135 lb (61.2 kg)   Height: 4\' 11"  (1.499 m)     Physical Exam Constitutional:      General: She is not in acute distress.    Appearance: She is well-developed. She is not diaphoretic.  HENT:     Head: Normocephalic and atraumatic.     Mouth/Throat:     Pharynx: No oropharyngeal exudate.  Eyes:     Conjunctiva/sclera: Conjunctivae normal.     Pupils: Pupils are equal, round, and reactive to light.  Cardiovascular:     Rate and Rhythm: Normal rate and regular rhythm.     Heart sounds: Normal heart sounds.  Pulmonary:     Effort: Pulmonary effort is normal.     Breath sounds: Normal breath sounds.  Abdominal:     General: Bowel sounds are normal. There is no distension.     Palpations: Abdomen is soft.     Tenderness: There is no abdominal tenderness. There is no guarding.  Musculoskeletal:     Cervical back: Normal range of motion and neck supple.     Right lower leg: No edema.     Left lower leg: No edema.     Comments: Left hip incision with dressing, minimal blood noted.  No drainage or heat noted.   Skin:    General: Skin is warm and dry.  Neurological:     Mental Status: She is alert.  Psychiatric:        Mood and Affect: Mood normal.     Labs reviewed: Basic Metabolic Panel: Recent Labs    10/21/23 0320 10/22/23 0423 10/23/23 0516  NA 133* 135 133*  K 3.6 3.8 3.9  CL 102 104 100  CO2 22 25 25    GLUCOSE 145* 128* 121*  BUN 18 23 26*  CREATININE 0.81 0.74 0.76  CALCIUM 8.5* 8.5* 8.1*   Liver Function Tests: Recent Labs    09/21/23 0800 10/19/23 2149  AST 21 30  ALT 21 26  ALKPHOS  --  68  BILITOT 0.6 0.7  PROT 7.6 8.1  ALBUMIN  --  4.2  No results for input(s): "LIPASE", "AMYLASE" in the last 8760 hours. No results for input(s): "AMMONIA" in the last 8760 hours. CBC: Recent Labs    09/21/23 0800 10/19/23 2149 10/20/23 0309 10/21/23 0320 10/22/23 0423 10/23/23 0516  WBC 6.4 13.3*   < > 12.5* 11.8* 12.1*  NEUTROABS 3,238 6.1  --   --   --   --   HGB 14.1 13.9   < > 12.0 11.5* 11.3*  HCT 42.8 40.7   < > 34.3* 32.9* 31.8*  MCV 93.2 91.7   < > 88.2 88.2 89.6  PLT 289 331   < > 276 291 303   < > = values in this interval not displayed.   TSH: No results for input(s): "TSH" in the last 8760 hours. A1C: Lab Results  Component Value Date   HGBA1C 5.7 03/19/2021   Lipid Panel: Recent Labs    09/21/23 0800  CHOL 221*  HDL 48*  LDLCALC 147*  TRIG 134  CHOLHDL 4.6    Assessment/Plan 1. Drug-induced constipation Will scheduled senna S BID at this time to help with constipation.   2. Nausea Continues on zofran PRN which has been effective, suspect when her bowels start moving regularly nausea will improve.   3. Closed displaced comminuted fracture of shaft of left femur with routine healing, subsequent encounter Pain well controlled with tylenol. Continues on PT and fall precautions   4. Essential hypertension -Blood pressure well controlled, goal bp <140/90 Continue current medications and dietary modifications follow metabolic panel  5. Age-related osteoporosis without current pathological fracture -continue on cal and vit d  She is on reclast infusion yearly  6. Post op anemia Will follow up cbc   Latunya Kissick K. Biagio Borg  [facility] [phone] 8 am - 5 pm) [phone] (after hours)

## 2023-10-27 ENCOUNTER — Non-Acute Institutional Stay (SKILLED_NURSING_FACILITY): Payer: Medicare Other | Admitting: Adult Health

## 2023-10-27 ENCOUNTER — Encounter: Payer: Self-pay | Admitting: Adult Health

## 2023-10-27 DIAGNOSIS — R110 Nausea: Secondary | ICD-10-CM | POA: Diagnosis not present

## 2023-10-27 DIAGNOSIS — S72352S Displaced comminuted fracture of shaft of left femur, sequela: Secondary | ICD-10-CM

## 2023-10-27 NOTE — Progress Notes (Unsigned)
Location:  Other [facility]) Nursing Home Room Number: [place] [id number]-A [phone]) Place of Service:  SNF (31) Provider:  Kenard Gower, DNP, FNP-BC  Patient Care Team: Sharon Seller, NP as PCP - General (Geriatric Medicine)  Extended Emergency Contact Information Primary Emergency Contact: Bowie,Ash Address: [address]          Lys[place] Pen[place] Home Phone: [phone] Mobile Phone: [phone] Relation: Son Secondary Emergency Contact: bravos,alexis Home Phone: [phone] Relation: Other  Code Status:  DNR  Goals of care: Advanced Directive information    10/27/2023    9:36 AM  Advanced Directives  Does Patient Have a Medical Advance Directive? Yes  Type of Estate agent of [place];Out of facility DNR (pink MOST or yellow form);Living will  Does patient want to make changes to medical advance directive? No - Patient declined  Copy of Healthcare Power of Attorney in Chart? Yes - validated most recent copy scanned in chart (See row information)     Chief Complaint  Patient presents with   Acute Visit    nausea    HPI:  Pt is a 89 y.o. female seen today for medical management of chronic diseases.  ***   Past Medical History:  Diagnosis Date   Hyperlipidemia    Hypertension    Past Surgical History:  Procedure Laterality Date   INTRAMEDULLARY (IM) NAIL INTERTROCHANTERIC Left 10/20/2023   Procedure: INTRAMEDULLARY (IM) NAIL INTERTROCHANTERIC;  Surgeon: Signa Kell, MD;  Location: [facility] ORS;  Service: Orthopedics;  Laterality: Left;   OPEN REDUCTION INTERNAL FIXATION (ORIF) DISTAL RADIAL FRACTURE Right 07/31/2017   Procedure: OPEN REDUCTION INTERNAL FIXATION (ORIF) DISTAL RADIAL FRACTURE;  Surgeon: Deeann Saint, MD;  Location: [facility] ORS;  Service: Orthopedics;  Laterality: Right;    No Known Allergies  Outpatient Encounter Medications as of 10/27/2023  Medication Sig   acetaminophen  (TYLENOL) 500 MG tablet Take 2 tablets (1,000 mg total) by mouth every 8 (eight) hours.   amLODipine (NORVASC) 5 MG tablet Take 1 tablet (5 mg total) by mouth daily.   cholecalciferol (VITAMIN D3) 25 MCG (1000 UT) tablet Take 1,000 Units by mouth daily.   enoxaparin (LOVENOX) 40 MG/0.4ML injection Inject 0.4 mLs (40 mg total) into the skin daily for 28 days.   methocarbamol (ROBAXIN) 500 MG tablet Take 1 tablet (500 mg total) by mouth every 6 (six) hours as needed for muscle spasms.   Multiple Vitamin (MULTIVITAMIN WITH MINERALS) TABS tablet Take 1 tablet by mouth daily.   ondansetron (ZOFRAN) 4 MG tablet Take 1 tablet (4 mg total) by mouth every 6 (six) hours as needed for nausea.   oxyCODONE (OXY IR/ROXICODONE) 5 MG immediate release tablet Take 0.5 tablets (2.5 mg total) by mouth every 4 (four) hours as needed (pain).   senna-docusate (SENOKOT-S) 8.6-50 MG tablet Take 1 tablet by mouth at bedtime as needed for mild constipation.   chlorhexidine (HIBICLENS) 4 % external liquid Apply 15 mLs (1 Application total) topically as directed for 30 doses. Use as directed daily for 5 days every other week for 6 weeks. (Patient not taking: Reported on 10/27/2023)   docusate sodium (COLACE) 100 MG capsule Take 1 capsule (100 mg total) by mouth 2 (two) times daily. (Patient not taking: Reported on 10/27/2023)   No facility-administered encounter medications on file as of 10/27/2023.    Review of Systems ***    Immunization History  Administered Date(s) Administered   Fluad Quad(high Dose 65+) 08/29/2019   Influenza,  High Dose Seasonal PF 08/25/2016, 09/07/2017, 09/10/2018, 09/12/2020, 10/05/2022   Moderna Covid-19 Vaccine Bivalent Booster 3yrs & up 05/17/2022   Moderna Sars-Covid-2 Vaccination 12/31/2019, 01/28/2020, 05/06/2021   PFIZER(Purple Top)SARS-COV-2 Vaccination 09/09/2021   Pneumococcal Conjugate-13 08/03/2017   Pneumococcal Polysaccharide-23 09/18/2006   Tdap 09/10/2018   Zoster  Recombinant(Shingrix) 09/13/2019, 03/07/2020   Pertinent  Health Maintenance Due  Topic Date Due   INFLUENZA VACCINE  07/20/2023   DEXA SCAN  Completed      09/22/2022    8:42 AM 09/27/2022    8:59 AM 12/01/2022    8:25 AM 03/30/2023    8:51 AM 09/26/2023    9:04 AM  Fall Risk  Falls in the past year? 0 0 0 0 0  Was there an injury with Fall? 0 0 0 0 0  Fall Risk Category Calculator 0 0 0 0 0  Fall Risk Category (Retired) Low Low Low    (RETIRED) Patient Fall Risk Level Low fall risk Low fall risk Low fall risk    Patient at Risk for Falls Due to No Fall Risks No Fall Risks No Fall Risks  No Fall Risks  Fall risk Follow up Falls evaluation completed Falls evaluation completed Falls evaluation completed  Falls evaluation completed     Vitals:   10/27/23 0929  BP: 126/70  Pulse: 64  Resp: 18  Temp: 98.2 F (36.8 C)  SpO2: 98%  Weight: 134 lb 3.2 oz (60.9 kg)  Height: 4\' 11"  (1.499 m)   Body mass index is 27.11 kg/m.  Physical Exam     Labs reviewed: Recent Labs    10/21/23 0320 10/22/23 0423 10/23/23 0516  NA 133* 135 133*  K 3.6 3.8 3.9  CL 102 104 100  CO2 22 25 25   GLUCOSE 145* 128* 121*  BUN 18 23 26*  CREATININE 0.81 0.74 0.76  CALCIUM 8.5* 8.5* 8.1*   Recent Labs    09/21/23 0800 10/19/23 2149  AST 21 30  ALT 21 26  ALKPHOS  --  68  BILITOT 0.6 0.7  PROT 7.6 8.1  ALBUMIN  --  4.2   Recent Labs    09/21/23 0800 10/19/23 2149 10/20/23 0309 10/21/23 0320 10/22/23 0423 10/23/23 0516  WBC 6.4 13.3*   < > 12.5* 11.8* 12.1*  NEUTROABS 3,238 6.1  --   --   --   --   HGB 14.1 13.9   < > 12.0 11.5* 11.3*  HCT 42.8 40.7   < > 34.3* 32.9* 31.8*  MCV 93.2 91.7   < > 88.2 88.2 89.6  PLT 289 331   < > 276 291 303   < > = values in this interval not displayed.   No results found for: "TSH" Lab Results  Component Value Date   HGBA1C 5.7 03/19/2021   Lab Results  Component Value Date   CHOL 221 (H) 09/21/2023   HDL 48 (L) 09/21/2023    LDLCALC 147 (H) 09/21/2023   LDLDIRECT 153.0 09/10/2018   TRIG 134 09/21/2023   CHOLHDL 4.6 09/21/2023    Significant Diagnostic Results in last 30 days:  DG HIP UNILAT WITH PELVIS 2-3 VIEWS LEFT  Result Date: 10/20/2023 CLINICAL DATA:  Left femoral nail EXAM: DG HIP (WITH OR WITHOUT PELVIS) 2-3V LEFT COMPARISON:  10/19/2023 FINDINGS: Intraoperative imaging demonstrates placement of hip screws and intramedullary nail across the proximal left femoral fracture. Anatomic alignment. No hardware complicating feature. IMPRESSION: Internal fixation without visible complicating feature. Electronically Signed   By:  Charlett Nose M.D.   On: 10/20/2023 18:08   DG C-Arm 1-60 Min-No Report  Result Date: 10/20/2023 Fluoroscopy was utilized by the requesting physician.  No radiographic interpretation.   DG Femur Min 2 Views Left  Result Date: 10/20/2023 CLINICAL DATA:  Fall with fracture EXAM: LEFT FEMUR 2 VIEWS COMPARISON:  None Available. FINDINGS: Acute comminuted fracture involving the proximal shaft of the femur with apex anterior angulation and close to 1 shaft diameter posterior placement of distal fracture fragment. Vascular calcifications. IMPRESSION: Acute comminuted, displaced and angulated proximal femoral shaft fracture Electronically Signed   By: Jasmine Pang M.D.   On: 10/20/2023 00:09   DG Chest 1 View  Result Date: 10/19/2023 CLINICAL DATA:  Fall with hip fracture EXAM: CHEST  1 VIEW COMPARISON:  07/31/2017 FINDINGS: No acute airspace disease or pleural effusion. Stable cardiomediastinal silhouette with aortic atherosclerosis. No pneumothorax. IMPRESSION: No active disease. Electronically Signed   By: Jasmine Pang M.D.   On: 10/19/2023 23:12   DG Hip Unilat W or Wo Pelvis 2-3 Views Left  Result Date: 10/19/2023 CLINICAL DATA:  Fall with hip deformity EXAM: DG HIP (WITH OR WITHOUT PELVIS) 2-3V LEFT COMPARISON:  None Available. FINDINGS: SI joints are non widened. Pubic symphysis appears  intact. Acute comminuted fracture involving the subtrochanteric femoral diaphysis with moderate apex lateral angulation and about 1/2 shaft diameter central and greater than 1 shaft diameter posterior displacement of distal fracture fragment. Left femoral head projects in joint. Probable old fracture deformities of the left superior and inferior pubic rami. IMPRESSION: Acute comminuted displaced and angulated subtrochanteric left femoral diaphyseal fracture. Electronically Signed   By: Jasmine Pang M.D.   On: 10/19/2023 23:11    Assessment/Plan ***   Family/ staff Communication: Discussed plan of care with resident and charge nurse  Labs/tests ordered:     Kenard Gower, DNP, MSN, FNP-BC [facility] [phone] (Monday-Friday 8:00 a.m. - 5:00 p.m.) [phone] (after hours)

## 2023-10-30 LAB — BASIC METABOLIC PANEL
BUN: 14 (ref 4–21)
CO2: 30 — AB (ref 13–22)
Chloride: 98 — AB (ref 99–108)
Creatinine: 0.8 (ref 0.5–1.1)
Glucose: 100
Potassium: 3.6 meq/L (ref 3.5–5.1)
Sodium: 133 — AB (ref 137–147)

## 2023-10-30 LAB — CBC: RBC: 3.22 — AB (ref 3.87–5.11)

## 2023-10-30 LAB — CBC AND DIFFERENTIAL
HCT: 31 — AB (ref 36–46)
Hemoglobin: 10.3 — AB (ref 12.0–16.0)
Neutrophils Absolute: 9865
Platelets: 393 10*3/uL (ref 150–400)
WBC: 12.3

## 2023-10-30 LAB — COMPREHENSIVE METABOLIC PANEL: Calcium: 8.2 — AB (ref 8.7–10.7)

## 2023-11-03 ENCOUNTER — Encounter: Payer: Self-pay | Admitting: Orthopedic Surgery

## 2023-11-06 DIAGNOSIS — Z9889 Other specified postprocedural states: Secondary | ICD-10-CM | POA: Diagnosis not present

## 2023-11-06 DIAGNOSIS — M25552 Pain in left hip: Secondary | ICD-10-CM | POA: Diagnosis not present

## 2023-11-06 DIAGNOSIS — Z8781 Personal history of (healed) traumatic fracture: Secondary | ICD-10-CM | POA: Diagnosis not present

## 2023-11-08 ENCOUNTER — Encounter: Payer: Self-pay | Admitting: Student

## 2023-11-08 ENCOUNTER — Non-Acute Institutional Stay (SKILLED_NURSING_FACILITY): Payer: Self-pay | Admitting: Student

## 2023-11-08 DIAGNOSIS — E785 Hyperlipidemia, unspecified: Secondary | ICD-10-CM | POA: Diagnosis not present

## 2023-11-08 DIAGNOSIS — K5903 Drug induced constipation: Secondary | ICD-10-CM

## 2023-11-08 DIAGNOSIS — S72352S Displaced comminuted fracture of shaft of left femur, sequela: Secondary | ICD-10-CM | POA: Diagnosis not present

## 2023-11-08 DIAGNOSIS — D509 Iron deficiency anemia, unspecified: Secondary | ICD-10-CM

## 2023-11-08 DIAGNOSIS — I10 Essential (primary) hypertension: Secondary | ICD-10-CM

## 2023-11-08 DIAGNOSIS — Z66 Do not resuscitate: Secondary | ICD-10-CM | POA: Diagnosis not present

## 2023-11-08 DIAGNOSIS — N1831 Chronic kidney disease, stage 3a: Secondary | ICD-10-CM | POA: Diagnosis not present

## 2023-11-08 NOTE — Progress Notes (Signed)
Provider:  Coralyn Helling, M.D. Location:  Other [facility]) Nursing Home Room Number: 104 [id number] Place of Service:  SNF (31)  PCP: Sharon Seller, NP Patient Care Team: Sharon Seller, NP as PCP - General (Geriatric Medicine)  Extended Emergency Contact Information Primary Emergency Contact: Bowie,Ash Address: [address]          [place] Home Phone: [phone] Mobile Phone: [phone] Relation: Son Secondary Emergency Contact: bravos,alexis Home Phone: [phone] Relation: Other  Code Status: DNR Goals of Care: Advanced Directive information    11/08/2023    8:03 AM  Advanced Directives  Does Patient Have a Medical Advance Directive? Yes  Type of Estate agent of [place];Out of facility DNR (pink MOST or yellow form);Living will  Does patient want to make changes to medical advance directive? No - Patient declined  Copy of Healthcare Power of Attorney in Chart? Yes - validated most recent copy scanned in chart (See row information)  Pre-existing out of facility DNR order (yellow form or pink MOST form) Pink MOST form placed in chart (order not valid for inpatient use)      Chief Complaint  Patient presents with   New Admit To SNF    New admission to [facility]     HPI: Patient is a 89 y.o. female seen today for admission to [facility].  She has been at [facility] since 2017. 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 She walks with a walker. Lives alone. Drives as well. She is fully independent. Manages medication at home.   She was admitted to the hospital after a fall and broke her hip. She was at [facility] to see a play and during intermission and she went to the restroom and fell down the steps. She has not had other falls this year, last one was in 2019 and she broke her wrist at that time. Her pain is well-controlled at this time. Tyelnol has been sufficient. Therapy has been going  well.   Bowel movements - she has had good ones. None yesterday. Stool softners make her cramp. About once a day. She is eating well.   Osteoporosis - she has been on fosamax for a long time. May of this year she changed to annual injection and her last one was in May 2024.   Past Medical History:  Diagnosis Date   Hyperlipidemia    Hypertension    Past Surgical History:  Procedure Laterality Date   INTRAMEDULLARY (IM) NAIL INTERTROCHANTERIC Left 10/20/2023   Procedure: INTRAMEDULLARY (IM) NAIL INTERTROCHANTERIC;  Surgeon: Signa Kell, MD;  Location: [facility] ORS;  Service: Orthopedics;  Laterality: Left;   OPEN REDUCTION INTERNAL FIXATION (ORIF) DISTAL RADIAL FRACTURE Right 07/31/2017   Procedure: OPEN REDUCTION INTERNAL FIXATION (ORIF) DISTAL RADIAL FRACTURE;  Surgeon: Deeann Saint, MD;  Location: [facility] ORS;  Service: Orthopedics;  Laterality: Right;    reports that she has never smoked. She has never used smokeless tobacco. She reports that she does not drink alcohol and does not use drugs. Social History   Socioeconomic History   Marital status: Widowed    Spouse name: Not on file   Number of children: Not on file   Years of education: Not on file   Highest education level: Not on file  Occupational History   Not on file  Tobacco Use   Smoking status: Never   Smokeless tobacco: Never  Vaping Use   Vaping status: Never Used  Substance and Sexual Activity   Alcohol use: No   Drug use: No   Sexual activity: Not on file  Other Topics Concern   Not on file  Social History Narrative   Not on file   Social Determinants of Health   Financial Resource Strain: Low Risk  (07/12/2018)   Overall Financial Resource Strain (CARDIA)    Difficulty of Paying Living Expenses: Not hard at all  Food Insecurity: No Food Insecurity (10/20/2023)   Hunger Vital Sign    Worried About Running Out of Food in the Last Year: Never true    Ran Out of Food in the Last Year: Never true   Transportation Needs: No Transportation Needs (10/20/2023)   PRAPARE - Administrator, Civil Service (Medical): No    Lack of Transportation (Non-Medical): No  Physical Activity: Unknown (09/13/2019)   Exercise Vital Sign    Days of Exercise per Week: Not on file    Minutes of Exercise per Session: 30 min  Stress: No Stress Concern Present (07/12/2018)   [employer] of Occupational Health - Occupational Stress Questionnaire    Feeling of Stress : Not at all  Social Connections: Not on file  Intimate Partner Violence: Not At Risk (10/20/2023)   Humiliation, Afraid, Rape, and Kick questionnaire    Fear of Current or Ex-Partner: No    Emotionally Abused: No    Physically Abused: No    Sexually Abused: No    Functional Status Survey:    Family History  Problem Relation Age of Onset   Stroke Other        Parent   Stroke Mother    Hypertension Mother    Hypertension Father     Health Maintenance  Topic Date Due   INFLUENZA VACCINE  03/18/2024 (Originally 07/20/2023)   Medicare Annual Wellness (AWV)  09/25/2024   DTaP/Tdap/Td (2 - Td or Tdap) 09/10/2028   Pneumonia Vaccine 50+ Years old  Completed   DEXA SCAN  Completed   Zoster Vaccines- Shingrix  Completed   HPV VACCINES  Aged Out   COVID-19 Vaccine  Discontinued    No Known Allergies  Outpatient Encounter Medications as of 11/08/2023  Medication Sig   acetaminophen (TYLENOL) 500 MG tablet Take 2 tablets (1,000 mg total) by mouth every 8 (eight) hours.   amLODipine (NORVASC) 5 MG tablet Take 1 tablet (5 mg total) by mouth daily.   cholecalciferol (VITAMIN D3) 25 MCG (1000 UT) tablet Take 1,000 Units by mouth daily.   enoxaparin (LOVENOX) 40 MG/0.4ML injection Inject 0.4 mLs (40 mg total) into the skin daily for 28 days.   methocarbamol (ROBAXIN) 500 MG tablet Take 1 tablet (500 mg total) by mouth every 6 (six) hours as needed for muscle spasms.   Multiple Vitamin (MULTIVITAMIN WITH MINERALS) TABS tablet  Take 1 tablet by mouth daily.   ondansetron (ZOFRAN) 4 MG tablet Take 1 tablet (4 mg total) by mouth every 6 (six) hours as needed for nausea.   senna-docusate (SENOKOT-S) 8.6-50 MG tablet Take 1 tablet by mouth at bedtime as needed for mild constipation.   chlorhexidine (HIBICLENS) 4 % external liquid Apply 15 mLs (1 Application total) topically as directed for 30 doses. Use as directed daily for 5 days every other week for 6 weeks. (Patient not taking: Reported on 10/27/2023)   docusate sodium (COLACE) 100 MG capsule Take 1 capsule (100 mg total) by mouth 2 (two) times daily. (Patient not taking: Reported on 10/27/2023)   No  facility-administered encounter medications on file as of 11/08/2023.    Review of Systems  Vitals:   11/08/23 0758  BP: 139/71  Pulse: 65  Resp: 18  Temp: 97.6 F (36.4 C)  SpO2: 94%  Weight: 136 lb 3.2 oz (61.8 kg)  Height: 4\' 11"  (1.499 m)   Body mass index is 27.51 kg/m. Physical Exam Constitutional:      Appearance: Normal appearance.  Cardiovascular:     Rate and Rhythm: Normal rate and regular rhythm.     Pulses: Normal pulses.     Heart sounds: Normal heart sounds.  Pulmonary:     Effort: Pulmonary effort is normal.  Abdominal:     General: Abdomen is flat. Bowel sounds are normal.     Palpations: Abdomen is soft.  Musculoskeletal:        General: No swelling or tenderness.  Skin:    General: Skin is warm and dry.     Capillary Refill: Capillary refill takes 2 to 3 seconds.     Comments: Healing bruising of the left hip.  Neurological:     Mental Status: She is alert and oriented to person, place, and time.     Gait: Gait normal.  Psychiatric:        Mood and Affect: Mood normal.     Labs reviewed: Basic Metabolic Panel: Recent Labs    10/21/23 0320 10/22/23 0423 10/23/23 0516 10/30/23 0000  NA 133* 135 133* 133*  K 3.6 3.8 3.9 3.6  CL 102 104 100 98*  CO2 22 25 25  30*  GLUCOSE 145* 128* 121*  --   BUN 18 23 26* 14   CREATININE 0.81 0.74 0.76 0.8  CALCIUM 8.5* 8.5* 8.1* 8.2*   Liver Function Tests: Recent Labs    09/21/23 0800 10/19/23 2149  AST 21 30  ALT 21 26  ALKPHOS  --  68  BILITOT 0.6 0.7  PROT 7.6 8.1  ALBUMIN  --  4.2   No results for input(s): "LIPASE", "AMYLASE" in the last 8760 hours. No results for input(s): "AMMONIA" in the last 8760 hours. CBC: Recent Labs    09/21/23 0800 10/19/23 2149 10/20/23 0309 10/21/23 0320 10/22/23 0423 10/23/23 0516 10/30/23 0000  WBC 6.4 13.3*   < > 12.5* 11.8* 12.1* 12.3  NEUTROABS 3,238 6.1  --   --   --   --  9,865.00  HGB 14.1 13.9   < > 12.0 11.5* 11.3* 10.3*  HCT 42.8 40.7   < > 34.3* 32.9* 31.8* 31*  MCV 93.2 91.7   < > 88.2 88.2 89.6  --   PLT 289 331   < > 276 291 303 393   < > = values in this interval not displayed.   Cardiac Enzymes: No results for input(s): "CKTOTAL", "CKMB", "CKMBINDEX", "TROPONINI" in the last 8760 hours. BNP: Invalid input(s): "POCBNP" Lab Results  Component Value Date   HGBA1C 5.7 03/19/2021   No results found for: "TSH" No results found for: "VITAMINB12" No results found for: "FOLATE" No results found for: "IRON", "TIBC", "FERRITIN"  Imaging and Procedures obtained prior to SNF admission: DG HIP UNILAT WITH PELVIS 2-3 VIEWS LEFT  Result Date: 10/20/2023 CLINICAL DATA:  Left femoral nail EXAM: DG HIP (WITH OR WITHOUT PELVIS) 2-3V LEFT COMPARISON:  10/19/2023 FINDINGS: Intraoperative imaging demonstrates placement of hip screws and intramedullary nail across the proximal left femoral fracture. Anatomic alignment. No hardware complicating feature. IMPRESSION: Internal fixation without visible complicating feature. Electronically Signed   By:  Charlett Nose M.D.   On: 10/20/2023 18:08   DG C-Arm 1-60 Min-No Report  Result Date: 10/20/2023 Fluoroscopy was utilized by the requesting physician.  No radiographic interpretation.   DG Femur Min 2 Views Left  Result Date: 10/20/2023 CLINICAL DATA:  Fall  with fracture EXAM: LEFT FEMUR 2 VIEWS COMPARISON:  None Available. FINDINGS: Acute comminuted fracture involving the proximal shaft of the femur with apex anterior angulation and close to 1 shaft diameter posterior placement of distal fracture fragment. Vascular calcifications. IMPRESSION: Acute comminuted, displaced and angulated proximal femoral shaft fracture Electronically Signed   By: Jasmine Pang M.D.   On: 10/20/2023 00:09   DG Chest 1 View  Result Date: 10/19/2023 CLINICAL DATA:  Fall with hip fracture EXAM: CHEST  1 VIEW COMPARISON:  07/31/2017 FINDINGS: No acute airspace disease or pleural effusion. Stable cardiomediastinal silhouette with aortic atherosclerosis. No pneumothorax. IMPRESSION: No active disease. Electronically Signed   By: Jasmine Pang M.D.   On: 10/19/2023 23:12   DG Hip Unilat W or Wo Pelvis 2-3 Views Left  Result Date: 10/19/2023 CLINICAL DATA:  Fall with hip deformity EXAM: DG HIP (WITH OR WITHOUT PELVIS) 2-3V LEFT COMPARISON:  None Available. FINDINGS: SI joints are non widened. Pubic symphysis appears intact. Acute comminuted fracture involving the subtrochanteric femoral diaphysis with moderate apex lateral angulation and about 1/2 shaft diameter central and greater than 1 shaft diameter posterior displacement of distal fracture fragment. Left femoral head projects in joint. Probable old fracture deformities of the left superior and inferior pubic rami. IMPRESSION: Acute comminuted displaced and angulated subtrochanteric left femoral diaphyseal fracture. Electronically Signed   By: Jasmine Pang M.D.   On: 10/19/2023 23:11    Assessment/Plan Closed displaced comminuted fracture of shaft of left femur, sequela  DNR (do not resuscitate) - Plan: Do not attempt resuscitation (DNR)  Essential hypertension  Iron deficiency anemia, unspecified iron deficiency anemia type  Hyperlipidemia, unspecified hyperlipidemia type  Chronic kidney disease, stage 3a  [facility]) Patient admitted after a fall s/p IM nail. Improving well with PT. Pain well-controlled with tylenol. Continue PT. Patient confirms code status DNR. BP well-controlled, continue current regimen. Most recent CBC at goal. Lipids well-controlled. Kidney function stable. Avoid nephrotoxic agents.Constipation improved.   Family/ staff Communication: nursing  Labs/tests ordered: none

## 2023-11-11 ENCOUNTER — Encounter: Payer: Self-pay | Admitting: Student

## 2023-11-20 DIAGNOSIS — S72352D Displaced comminuted fracture of shaft of left femur, subsequent encounter for closed fracture with routine healing: Secondary | ICD-10-CM | POA: Diagnosis not present

## 2023-11-20 DIAGNOSIS — Z741 Need for assistance with personal care: Secondary | ICD-10-CM | POA: Diagnosis not present

## 2023-11-20 DIAGNOSIS — D62 Acute posthemorrhagic anemia: Secondary | ICD-10-CM | POA: Diagnosis not present

## 2023-11-20 DIAGNOSIS — Z9181 History of falling: Secondary | ICD-10-CM | POA: Diagnosis not present

## 2023-11-20 DIAGNOSIS — R2689 Other abnormalities of gait and mobility: Secondary | ICD-10-CM | POA: Diagnosis not present

## 2023-11-20 DIAGNOSIS — M6281 Muscle weakness (generalized): Secondary | ICD-10-CM | POA: Diagnosis not present

## 2023-11-20 DIAGNOSIS — I10 Essential (primary) hypertension: Secondary | ICD-10-CM | POA: Diagnosis not present

## 2023-11-20 DIAGNOSIS — R278 Other lack of coordination: Secondary | ICD-10-CM | POA: Diagnosis not present

## 2023-11-22 DIAGNOSIS — I10 Essential (primary) hypertension: Secondary | ICD-10-CM | POA: Diagnosis not present

## 2023-11-22 DIAGNOSIS — D62 Acute posthemorrhagic anemia: Secondary | ICD-10-CM | POA: Diagnosis not present

## 2023-11-22 DIAGNOSIS — S72352D Displaced comminuted fracture of shaft of left femur, subsequent encounter for closed fracture with routine healing: Secondary | ICD-10-CM | POA: Diagnosis not present

## 2023-11-22 DIAGNOSIS — R278 Other lack of coordination: Secondary | ICD-10-CM | POA: Diagnosis not present

## 2023-11-22 DIAGNOSIS — Z741 Need for assistance with personal care: Secondary | ICD-10-CM | POA: Diagnosis not present

## 2023-11-22 DIAGNOSIS — Z9181 History of falling: Secondary | ICD-10-CM | POA: Diagnosis not present

## 2023-11-23 DIAGNOSIS — S72352D Displaced comminuted fracture of shaft of left femur, subsequent encounter for closed fracture with routine healing: Secondary | ICD-10-CM | POA: Diagnosis not present

## 2023-11-23 DIAGNOSIS — Z9181 History of falling: Secondary | ICD-10-CM | POA: Diagnosis not present

## 2023-11-23 DIAGNOSIS — I10 Essential (primary) hypertension: Secondary | ICD-10-CM | POA: Diagnosis not present

## 2023-11-23 DIAGNOSIS — D62 Acute posthemorrhagic anemia: Secondary | ICD-10-CM | POA: Diagnosis not present

## 2023-11-23 DIAGNOSIS — R278 Other lack of coordination: Secondary | ICD-10-CM | POA: Diagnosis not present

## 2023-11-23 DIAGNOSIS — Z741 Need for assistance with personal care: Secondary | ICD-10-CM | POA: Diagnosis not present

## 2023-11-27 DIAGNOSIS — Z741 Need for assistance with personal care: Secondary | ICD-10-CM | POA: Diagnosis not present

## 2023-11-27 DIAGNOSIS — I10 Essential (primary) hypertension: Secondary | ICD-10-CM | POA: Diagnosis not present

## 2023-11-27 DIAGNOSIS — Z9181 History of falling: Secondary | ICD-10-CM | POA: Diagnosis not present

## 2023-11-27 DIAGNOSIS — S72352D Displaced comminuted fracture of shaft of left femur, subsequent encounter for closed fracture with routine healing: Secondary | ICD-10-CM | POA: Diagnosis not present

## 2023-11-27 DIAGNOSIS — R278 Other lack of coordination: Secondary | ICD-10-CM | POA: Diagnosis not present

## 2023-11-27 DIAGNOSIS — D62 Acute posthemorrhagic anemia: Secondary | ICD-10-CM | POA: Diagnosis not present

## 2023-11-28 ENCOUNTER — Ambulatory Visit: Payer: Medicare Other | Admitting: Nurse Practitioner

## 2023-11-28 ENCOUNTER — Encounter: Payer: Self-pay | Admitting: Nurse Practitioner

## 2023-11-28 VITALS — BP 162/90 | HR 78 | Temp 98.3°F | Ht 59.0 in | Wt 134.0 lb

## 2023-11-28 DIAGNOSIS — D509 Iron deficiency anemia, unspecified: Secondary | ICD-10-CM

## 2023-11-28 DIAGNOSIS — S72352D Displaced comminuted fracture of shaft of left femur, subsequent encounter for closed fracture with routine healing: Secondary | ICD-10-CM

## 2023-11-28 DIAGNOSIS — K5903 Drug induced constipation: Secondary | ICD-10-CM | POA: Diagnosis not present

## 2023-11-28 DIAGNOSIS — I10 Essential (primary) hypertension: Secondary | ICD-10-CM

## 2023-11-28 DIAGNOSIS — D62 Acute posthemorrhagic anemia: Secondary | ICD-10-CM | POA: Diagnosis not present

## 2023-11-28 DIAGNOSIS — Z9181 History of falling: Secondary | ICD-10-CM | POA: Diagnosis not present

## 2023-11-28 DIAGNOSIS — M810 Age-related osteoporosis without current pathological fracture: Secondary | ICD-10-CM

## 2023-11-28 DIAGNOSIS — R278 Other lack of coordination: Secondary | ICD-10-CM | POA: Diagnosis not present

## 2023-11-28 DIAGNOSIS — Z741 Need for assistance with personal care: Secondary | ICD-10-CM | POA: Diagnosis not present

## 2023-11-28 NOTE — Progress Notes (Signed)
Careteam: Patient Care Team: Sharon Seller, NP as PCP - General (Geriatric Medicine)  Advanced Directive information Does Patient Have a Medical Advance Directive?: Yes, Type of Advance Directive: Healthcare Power of [place];Out of facility DNR (pink MOST or yellow form);Living will, Does patient want to make changes to medical advance directive?: No - Patient declined  Allergies  Allergen Reactions   Lactose     Chief Complaint  Patient presents with   Acute Visit    Rehab Follow up. Broke hip 10/18/23     HPI: Patient is a 89 y.o. female seen in today at the [facility] for follow up rehab.   She is doing well after fracture of left femur s/p repair.  Continues to follow up with orthopedic, next appt Jan 8th.  In the morning she can feel the hip but as the day progresses pain gets better.  She uses tylenol which controls pain.  Continues to go to PT/OT at [facility] and doing well with that Uses cane to help with ambulation - prior to fall was not using assistive device- goal is to be off.  She was dancing prior and goal is to get back to that.   Blood pressure is variable- sometimes high and other time normal Has not been taking at home.  She took medication this morning.   Moving her bowels well now- no ongoing constipation.   Review of Systems:  Review of Systems  Constitutional:  Negative for chills, fever and weight loss.  HENT:  Negative for tinnitus.   Respiratory:  Negative for cough, sputum production and shortness of breath.   Cardiovascular:  Negative for chest pain, palpitations and leg swelling.  Gastrointestinal:  Negative for abdominal pain, constipation, diarrhea and heartburn.  Genitourinary:  Negative for dysuria, frequency and urgency.  Musculoskeletal:  Negative for back pain, falls, joint pain and myalgias.  Skin: Negative.   Neurological:  Negative for dizziness and headaches.  Psychiatric/Behavioral:  Negative for depression and  memory loss. The patient does not have insomnia.     Past Medical History:  Diagnosis Date   Hyperlipidemia    Hypertension    Past Surgical History:  Procedure Laterality Date   INTRAMEDULLARY (IM) NAIL INTERTROCHANTERIC Left 10/20/2023   Procedure: INTRAMEDULLARY (IM) NAIL INTERTROCHANTERIC;  Surgeon: Signa Kell, MD;  Location: [facility] ORS;  Service: Orthopedics;  Laterality: Left;   OPEN REDUCTION INTERNAL FIXATION (ORIF) DISTAL RADIAL FRACTURE Right 07/31/2017   Procedure: OPEN REDUCTION INTERNAL FIXATION (ORIF) DISTAL RADIAL FRACTURE;  Surgeon: Deeann Saint, MD;  Location: [facility] ORS;  Service: Orthopedics;  Laterality: Right;   Social History:   reports that she has never smoked. She has never used smokeless tobacco. She reports that she does not drink alcohol and does not use drugs.  Family History  Problem Relation Age of Onset   Stroke Other        Parent   Stroke Mother    Hypertension Mother    Hypertension Father     Medications: Patient's Medications  New Prescriptions   No medications on file  Previous Medications   ACETAMINOPHEN (TYLENOL) 500 MG TABLET    Take 2 tablets (1,000 mg total) by mouth every 8 (eight) hours.   AMLODIPINE (NORVASC) 5 MG TABLET    Take 1 tablet (5 mg total) by mouth daily.   CHOLECALCIFEROL (VITAMIN D3) 25 MCG (1000 UT) TABLET    Take 1,000 Units by mouth daily.   MULTIPLE VITAMIN (MULTIVITAMIN WITH MINERALS) TABS  TABLET    Take 1 tablet by mouth daily.  Modified Medications   No medications on file  Discontinued Medications   CHLORHEXIDINE (HIBICLENS) 4 % EXTERNAL LIQUID    Apply 15 mLs (1 Application total) topically as directed for 30 doses. Use as directed daily for 5 days every other week for 6 weeks.   DOCUSATE SODIUM (COLACE) 100 MG CAPSULE    Take 1 capsule (100 mg total) by mouth 2 (two) times daily.   ENOXAPARIN (LOVENOX) 40 MG/0.4ML INJECTION    Inject 0.4 mLs (40 mg total) into the skin daily for 28 days.   METHOCARBAMOL  (ROBAXIN) 500 MG TABLET    Take 1 tablet (500 mg total) by mouth every 6 (six) hours as needed for muscle spasms.   ONDANSETRON (ZOFRAN) 4 MG TABLET    Take 1 tablet (4 mg total) by mouth every 6 (six) hours as needed for nausea.   SENNA-DOCUSATE (SENOKOT-S) 8.6-50 MG TABLET    Take 1 tablet by mouth at bedtime as needed for mild constipation.    Physical Exam:  Vitals:   11/28/23 1018 11/28/23 1022  BP: (!) 168/92 (!) 162/90  Pulse: 78   Temp: 98.3 F (36.8 C)   SpO2: 98%   Weight: 134 lb (60.8 kg)   Height: 4\' 11"  (1.499 m)    Body mass index is 27.06 kg/m. Wt Readings from Last 3 Encounters:  11/28/23 134 lb (60.8 kg)  11/08/23 136 lb 3.2 oz (61.8 kg)  10/27/23 134 lb 3.2 oz (60.9 kg)    Physical Exam Constitutional:      General: She is not in acute distress.    Appearance: She is well-developed. She is not diaphoretic.  HENT:     Head: Normocephalic and atraumatic.     Mouth/Throat:     Pharynx: No oropharyngeal exudate.  Eyes:     Conjunctiva/sclera: Conjunctivae normal.     Pupils: Pupils are equal, round, and reactive to light.  Cardiovascular:     Rate and Rhythm: Normal rate and regular rhythm.     Heart sounds: Normal heart sounds.  Pulmonary:     Effort: Pulmonary effort is normal.     Breath sounds: Normal breath sounds.  Abdominal:     General: Bowel sounds are normal.     Palpations: Abdomen is soft.  Musculoskeletal:     Cervical back: Normal range of motion and neck supple.     Right lower leg: No edema.     Left lower leg: No edema.  Skin:    General: Skin is warm and dry.  Neurological:     Mental Status: She is alert.  Psychiatric:        Mood and Affect: Mood normal.     Labs reviewed: Basic Metabolic Panel: Recent Labs    10/21/23 0320 10/22/23 0423 10/23/23 0516 10/30/23 0000  NA 133* 135 133* 133*  K 3.6 3.8 3.9 3.6  CL 102 104 100 98*  CO2 22 25 25  30*  GLUCOSE 145* 128* 121*  --   BUN 18 23 26* 14  CREATININE 0.81 0.74  0.76 0.8  CALCIUM 8.5* 8.5* 8.1* 8.2*   Liver Function Tests: Recent Labs    09/21/23 0800 10/19/23 2149  AST 21 30  ALT 21 26  ALKPHOS  --  68  BILITOT 0.6 0.7  PROT 7.6 8.1  ALBUMIN  --  4.2   No results for input(s): "LIPASE", "AMYLASE" in the last 8760 hours. No results for  input(s): "AMMONIA" in the last 8760 hours. CBC: Recent Labs    09/21/23 0800 10/19/23 2149 10/20/23 0309 10/21/23 0320 10/22/23 0423 10/23/23 0516 10/30/23 0000  WBC 6.4 13.3*   < > 12.5* 11.8* 12.1* 12.3  NEUTROABS 3,238 6.1  --   --   --   --  9,865.00  HGB 14.1 13.9   < > 12.0 11.5* 11.3* 10.3*  HCT 42.8 40.7   < > 34.3* 32.9* 31.8* 31*  MCV 93.2 91.7   < > 88.2 88.2 89.6  --   PLT 289 331   < > 276 291 303 393   < > = values in this interval not displayed.   Lipid Panel: Recent Labs    09/21/23 0800  CHOL 221*  HDL 48*  LDLCALC 147*  TRIG 134  CHOLHDL 4.6   TSH: No results for input(s): "TSH" in the last 8760 hours. A1C: Lab Results  Component Value Date   HGBA1C 5.7 03/19/2021     Assessment/Plan 1. Essential hypertension -Blood pressure elevated today but typically well controlled -home blood pressures are well controlled -No changes to medications today  -will have pt continue to monitor home bp goal <140/90, to notify if readings remain high on 3 different days  -follow metabolic panel - CBC with Differential/Platelet - BASIC METABOLIC PANEL WITH GFR  2. Drug-induced constipation Well controlled at this time and now not needing any medication for bowels.   3. Iron deficiency anemia, unspecified iron deficiency anemia type -will follow up labs not currently on supplement - CBC with Differential/Platelet  4. Age-related osteoporosis without current pathological fracture Continue vit d with yearly reclast  5. Closed displaced comminuted fracture of shaft of left femur with routine healing, subsequent encounter S/p repair, Doing well post op- at home now.  PT/OT  continuing and working to get off cane Pain controlled on tylenol.  Continues with orthopedic follow up   Kendrick Haapala K. Biagio Borg  [facility] [phone]

## 2023-11-28 NOTE — Patient Instructions (Addendum)
To check blood pressure 1 hour AFTER you have had your medication Make sure you have been sitting at least 5 mins  Record and let us know.  Goal <140/90   To come to [facility] on 11/30/23 at 7:30 am for lab work

## 2023-11-29 DIAGNOSIS — S72352D Displaced comminuted fracture of shaft of left femur, subsequent encounter for closed fracture with routine healing: Secondary | ICD-10-CM | POA: Diagnosis not present

## 2023-11-29 DIAGNOSIS — I10 Essential (primary) hypertension: Secondary | ICD-10-CM | POA: Diagnosis not present

## 2023-11-29 DIAGNOSIS — Z9181 History of falling: Secondary | ICD-10-CM | POA: Diagnosis not present

## 2023-11-29 DIAGNOSIS — Z741 Need for assistance with personal care: Secondary | ICD-10-CM | POA: Diagnosis not present

## 2023-11-29 DIAGNOSIS — D62 Acute posthemorrhagic anemia: Secondary | ICD-10-CM | POA: Diagnosis not present

## 2023-11-29 DIAGNOSIS — R278 Other lack of coordination: Secondary | ICD-10-CM | POA: Diagnosis not present

## 2023-11-30 DIAGNOSIS — Z741 Need for assistance with personal care: Secondary | ICD-10-CM | POA: Diagnosis not present

## 2023-11-30 DIAGNOSIS — S72352D Displaced comminuted fracture of shaft of left femur, subsequent encounter for closed fracture with routine healing: Secondary | ICD-10-CM | POA: Diagnosis not present

## 2023-11-30 DIAGNOSIS — Z9181 History of falling: Secondary | ICD-10-CM | POA: Diagnosis not present

## 2023-11-30 DIAGNOSIS — D62 Acute posthemorrhagic anemia: Secondary | ICD-10-CM | POA: Diagnosis not present

## 2023-11-30 DIAGNOSIS — I10 Essential (primary) hypertension: Secondary | ICD-10-CM | POA: Diagnosis not present

## 2023-11-30 DIAGNOSIS — R278 Other lack of coordination: Secondary | ICD-10-CM | POA: Diagnosis not present

## 2023-11-30 LAB — CBC WITH DIFFERENTIAL/PLATELET
Absolute Lymphocytes: 4687 {cells}/uL — ABNORMAL HIGH (ref 850–3900)
Absolute Monocytes: 896 {cells}/uL (ref 200–950)
Basophils Absolute: 54 {cells}/uL (ref 0–200)
Basophils Relative: 0.5 %
Eosinophils Absolute: 324 {cells}/uL (ref 15–500)
Eosinophils Relative: 3 %
HCT: 40.4 % (ref 35.0–45.0)
Hemoglobin: 13.2 g/dL (ref 11.7–15.5)
MCH: 31.2 pg (ref 27.0–33.0)
MCHC: 32.7 g/dL (ref 32.0–36.0)
MCV: 95.5 fL (ref 80.0–100.0)
MPV: 10.9 fL (ref 7.5–12.5)
Monocytes Relative: 8.3 %
Neutro Abs: 4838 {cells}/uL (ref 1500–7800)
Neutrophils Relative %: 44.8 %
Platelets: 416 10*3/uL — ABNORMAL HIGH (ref 140–400)
RBC: 4.23 10*6/uL (ref 3.80–5.10)
RDW: 13.5 % (ref 11.0–15.0)
Total Lymphocyte: 43.4 %
WBC: 10.8 10*3/uL (ref 3.8–10.8)

## 2023-11-30 LAB — BASIC METABOLIC PANEL WITH GFR
BUN/Creatinine Ratio: 19 (calc) (ref 6–22)
BUN: 19 mg/dL (ref 7–25)
CO2: 25 mmol/L (ref 20–32)
Calcium: 10.1 mg/dL (ref 8.6–10.4)
Chloride: 103 mmol/L (ref 98–110)
Creat: 0.98 mg/dL — ABNORMAL HIGH (ref 0.60–0.95)
Glucose, Bld: 98 mg/dL (ref 65–99)
Potassium: 4.3 mmol/L (ref 3.5–5.3)
Sodium: 139 mmol/L (ref 135–146)
eGFR: 54 mL/min/{1.73_m2} — ABNORMAL LOW (ref >=60)

## 2023-12-04 DIAGNOSIS — S72352D Displaced comminuted fracture of shaft of left femur, subsequent encounter for closed fracture with routine healing: Secondary | ICD-10-CM | POA: Diagnosis not present

## 2023-12-04 DIAGNOSIS — R278 Other lack of coordination: Secondary | ICD-10-CM | POA: Diagnosis not present

## 2023-12-04 DIAGNOSIS — I10 Essential (primary) hypertension: Secondary | ICD-10-CM | POA: Diagnosis not present

## 2023-12-04 DIAGNOSIS — D62 Acute posthemorrhagic anemia: Secondary | ICD-10-CM | POA: Diagnosis not present

## 2023-12-04 DIAGNOSIS — Z9181 History of falling: Secondary | ICD-10-CM | POA: Diagnosis not present

## 2023-12-04 DIAGNOSIS — Z741 Need for assistance with personal care: Secondary | ICD-10-CM | POA: Diagnosis not present

## 2023-12-05 DIAGNOSIS — R278 Other lack of coordination: Secondary | ICD-10-CM | POA: Diagnosis not present

## 2023-12-05 DIAGNOSIS — I10 Essential (primary) hypertension: Secondary | ICD-10-CM | POA: Diagnosis not present

## 2023-12-05 DIAGNOSIS — Z741 Need for assistance with personal care: Secondary | ICD-10-CM | POA: Diagnosis not present

## 2023-12-05 DIAGNOSIS — S72352D Displaced comminuted fracture of shaft of left femur, subsequent encounter for closed fracture with routine healing: Secondary | ICD-10-CM | POA: Diagnosis not present

## 2023-12-05 DIAGNOSIS — Z9181 History of falling: Secondary | ICD-10-CM | POA: Diagnosis not present

## 2023-12-05 DIAGNOSIS — D62 Acute posthemorrhagic anemia: Secondary | ICD-10-CM | POA: Diagnosis not present

## 2023-12-06 DIAGNOSIS — R278 Other lack of coordination: Secondary | ICD-10-CM | POA: Diagnosis not present

## 2023-12-06 DIAGNOSIS — D62 Acute posthemorrhagic anemia: Secondary | ICD-10-CM | POA: Diagnosis not present

## 2023-12-06 DIAGNOSIS — Z741 Need for assistance with personal care: Secondary | ICD-10-CM | POA: Diagnosis not present

## 2023-12-06 DIAGNOSIS — S72352D Displaced comminuted fracture of shaft of left femur, subsequent encounter for closed fracture with routine healing: Secondary | ICD-10-CM | POA: Diagnosis not present

## 2023-12-06 DIAGNOSIS — Z9181 History of falling: Secondary | ICD-10-CM | POA: Diagnosis not present

## 2023-12-06 DIAGNOSIS — I10 Essential (primary) hypertension: Secondary | ICD-10-CM | POA: Diagnosis not present

## 2023-12-07 DIAGNOSIS — D62 Acute posthemorrhagic anemia: Secondary | ICD-10-CM | POA: Diagnosis not present

## 2023-12-07 DIAGNOSIS — R278 Other lack of coordination: Secondary | ICD-10-CM | POA: Diagnosis not present

## 2023-12-07 DIAGNOSIS — Z9181 History of falling: Secondary | ICD-10-CM | POA: Diagnosis not present

## 2023-12-07 DIAGNOSIS — S72352D Displaced comminuted fracture of shaft of left femur, subsequent encounter for closed fracture with routine healing: Secondary | ICD-10-CM | POA: Diagnosis not present

## 2023-12-07 DIAGNOSIS — Z741 Need for assistance with personal care: Secondary | ICD-10-CM | POA: Diagnosis not present

## 2023-12-07 DIAGNOSIS — I10 Essential (primary) hypertension: Secondary | ICD-10-CM | POA: Diagnosis not present

## 2023-12-14 DIAGNOSIS — D62 Acute posthemorrhagic anemia: Secondary | ICD-10-CM | POA: Diagnosis not present

## 2023-12-14 DIAGNOSIS — R278 Other lack of coordination: Secondary | ICD-10-CM | POA: Diagnosis not present

## 2023-12-14 DIAGNOSIS — S72352D Displaced comminuted fracture of shaft of left femur, subsequent encounter for closed fracture with routine healing: Secondary | ICD-10-CM | POA: Diagnosis not present

## 2023-12-14 DIAGNOSIS — Z741 Need for assistance with personal care: Secondary | ICD-10-CM | POA: Diagnosis not present

## 2023-12-14 DIAGNOSIS — Z9181 History of falling: Secondary | ICD-10-CM | POA: Diagnosis not present

## 2023-12-14 DIAGNOSIS — I10 Essential (primary) hypertension: Secondary | ICD-10-CM | POA: Diagnosis not present

## 2023-12-15 DIAGNOSIS — D62 Acute posthemorrhagic anemia: Secondary | ICD-10-CM | POA: Diagnosis not present

## 2023-12-15 DIAGNOSIS — Z741 Need for assistance with personal care: Secondary | ICD-10-CM | POA: Diagnosis not present

## 2023-12-15 DIAGNOSIS — S72352D Displaced comminuted fracture of shaft of left femur, subsequent encounter for closed fracture with routine healing: Secondary | ICD-10-CM | POA: Diagnosis not present

## 2023-12-15 DIAGNOSIS — I10 Essential (primary) hypertension: Secondary | ICD-10-CM | POA: Diagnosis not present

## 2023-12-15 DIAGNOSIS — Z9181 History of falling: Secondary | ICD-10-CM | POA: Diagnosis not present

## 2023-12-15 DIAGNOSIS — R278 Other lack of coordination: Secondary | ICD-10-CM | POA: Diagnosis not present

## 2023-12-18 DIAGNOSIS — D62 Acute posthemorrhagic anemia: Secondary | ICD-10-CM | POA: Diagnosis not present

## 2023-12-18 DIAGNOSIS — R278 Other lack of coordination: Secondary | ICD-10-CM | POA: Diagnosis not present

## 2023-12-18 DIAGNOSIS — I10 Essential (primary) hypertension: Secondary | ICD-10-CM | POA: Diagnosis not present

## 2023-12-18 DIAGNOSIS — Z9181 History of falling: Secondary | ICD-10-CM | POA: Diagnosis not present

## 2023-12-18 DIAGNOSIS — S72352D Displaced comminuted fracture of shaft of left femur, subsequent encounter for closed fracture with routine healing: Secondary | ICD-10-CM | POA: Diagnosis not present

## 2023-12-18 DIAGNOSIS — Z741 Need for assistance with personal care: Secondary | ICD-10-CM | POA: Diagnosis not present

## 2023-12-19 DIAGNOSIS — Z741 Need for assistance with personal care: Secondary | ICD-10-CM | POA: Diagnosis not present

## 2023-12-19 DIAGNOSIS — Z9181 History of falling: Secondary | ICD-10-CM | POA: Diagnosis not present

## 2023-12-19 DIAGNOSIS — R278 Other lack of coordination: Secondary | ICD-10-CM | POA: Diagnosis not present

## 2023-12-19 DIAGNOSIS — I10 Essential (primary) hypertension: Secondary | ICD-10-CM | POA: Diagnosis not present

## 2023-12-19 DIAGNOSIS — S72352D Displaced comminuted fracture of shaft of left femur, subsequent encounter for closed fracture with routine healing: Secondary | ICD-10-CM | POA: Diagnosis not present

## 2023-12-19 DIAGNOSIS — D62 Acute posthemorrhagic anemia: Secondary | ICD-10-CM | POA: Diagnosis not present

## 2023-12-20 ENCOUNTER — Other Ambulatory Visit: Payer: Self-pay | Admitting: Nurse Practitioner

## 2023-12-20 DIAGNOSIS — I10 Essential (primary) hypertension: Secondary | ICD-10-CM

## 2023-12-20 DIAGNOSIS — D62 Acute posthemorrhagic anemia: Secondary | ICD-10-CM | POA: Diagnosis not present

## 2023-12-20 DIAGNOSIS — S72352D Displaced comminuted fracture of shaft of left femur, subsequent encounter for closed fracture with routine healing: Secondary | ICD-10-CM | POA: Diagnosis not present

## 2023-12-20 DIAGNOSIS — R278 Other lack of coordination: Secondary | ICD-10-CM | POA: Diagnosis not present

## 2023-12-20 DIAGNOSIS — Z9181 History of falling: Secondary | ICD-10-CM | POA: Diagnosis not present

## 2023-12-20 DIAGNOSIS — R2689 Other abnormalities of gait and mobility: Secondary | ICD-10-CM | POA: Diagnosis not present

## 2023-12-20 DIAGNOSIS — M6281 Muscle weakness (generalized): Secondary | ICD-10-CM | POA: Diagnosis not present

## 2023-12-20 DIAGNOSIS — Z741 Need for assistance with personal care: Secondary | ICD-10-CM | POA: Diagnosis not present

## 2023-12-21 DIAGNOSIS — R278 Other lack of coordination: Secondary | ICD-10-CM | POA: Diagnosis not present

## 2023-12-21 DIAGNOSIS — Z9181 History of falling: Secondary | ICD-10-CM | POA: Diagnosis not present

## 2023-12-21 DIAGNOSIS — I10 Essential (primary) hypertension: Secondary | ICD-10-CM | POA: Diagnosis not present

## 2023-12-21 DIAGNOSIS — Z741 Need for assistance with personal care: Secondary | ICD-10-CM | POA: Diagnosis not present

## 2023-12-21 DIAGNOSIS — S72352D Displaced comminuted fracture of shaft of left femur, subsequent encounter for closed fracture with routine healing: Secondary | ICD-10-CM | POA: Diagnosis not present

## 2023-12-21 DIAGNOSIS — D62 Acute posthemorrhagic anemia: Secondary | ICD-10-CM | POA: Diagnosis not present

## 2023-12-25 DIAGNOSIS — I10 Essential (primary) hypertension: Secondary | ICD-10-CM | POA: Diagnosis not present

## 2023-12-25 DIAGNOSIS — S72352D Displaced comminuted fracture of shaft of left femur, subsequent encounter for closed fracture with routine healing: Secondary | ICD-10-CM | POA: Diagnosis not present

## 2023-12-25 DIAGNOSIS — Z9181 History of falling: Secondary | ICD-10-CM | POA: Diagnosis not present

## 2023-12-25 DIAGNOSIS — R278 Other lack of coordination: Secondary | ICD-10-CM | POA: Diagnosis not present

## 2023-12-25 DIAGNOSIS — D62 Acute posthemorrhagic anemia: Secondary | ICD-10-CM | POA: Diagnosis not present

## 2023-12-25 DIAGNOSIS — Z741 Need for assistance with personal care: Secondary | ICD-10-CM | POA: Diagnosis not present

## 2023-12-26 DIAGNOSIS — S72352D Displaced comminuted fracture of shaft of left femur, subsequent encounter for closed fracture with routine healing: Secondary | ICD-10-CM | POA: Diagnosis not present

## 2023-12-26 DIAGNOSIS — Z9181 History of falling: Secondary | ICD-10-CM | POA: Diagnosis not present

## 2023-12-26 DIAGNOSIS — D62 Acute posthemorrhagic anemia: Secondary | ICD-10-CM | POA: Diagnosis not present

## 2023-12-26 DIAGNOSIS — I10 Essential (primary) hypertension: Secondary | ICD-10-CM | POA: Diagnosis not present

## 2023-12-26 DIAGNOSIS — Z741 Need for assistance with personal care: Secondary | ICD-10-CM | POA: Diagnosis not present

## 2023-12-26 DIAGNOSIS — R278 Other lack of coordination: Secondary | ICD-10-CM | POA: Diagnosis not present

## 2023-12-27 DIAGNOSIS — Z741 Need for assistance with personal care: Secondary | ICD-10-CM | POA: Diagnosis not present

## 2023-12-27 DIAGNOSIS — Z8781 Personal history of (healed) traumatic fracture: Secondary | ICD-10-CM | POA: Diagnosis not present

## 2023-12-27 DIAGNOSIS — Z9889 Other specified postprocedural states: Secondary | ICD-10-CM | POA: Diagnosis not present

## 2023-12-27 DIAGNOSIS — D62 Acute posthemorrhagic anemia: Secondary | ICD-10-CM | POA: Diagnosis not present

## 2023-12-27 DIAGNOSIS — I10 Essential (primary) hypertension: Secondary | ICD-10-CM | POA: Diagnosis not present

## 2023-12-27 DIAGNOSIS — Z9181 History of falling: Secondary | ICD-10-CM | POA: Diagnosis not present

## 2023-12-27 DIAGNOSIS — R278 Other lack of coordination: Secondary | ICD-10-CM | POA: Diagnosis not present

## 2023-12-27 DIAGNOSIS — S72352D Displaced comminuted fracture of shaft of left femur, subsequent encounter for closed fracture with routine healing: Secondary | ICD-10-CM | POA: Diagnosis not present

## 2023-12-28 DIAGNOSIS — Z9181 History of falling: Secondary | ICD-10-CM | POA: Diagnosis not present

## 2023-12-28 DIAGNOSIS — I10 Essential (primary) hypertension: Secondary | ICD-10-CM | POA: Diagnosis not present

## 2023-12-28 DIAGNOSIS — R278 Other lack of coordination: Secondary | ICD-10-CM | POA: Diagnosis not present

## 2023-12-28 DIAGNOSIS — Z741 Need for assistance with personal care: Secondary | ICD-10-CM | POA: Diagnosis not present

## 2023-12-28 DIAGNOSIS — S72352D Displaced comminuted fracture of shaft of left femur, subsequent encounter for closed fracture with routine healing: Secondary | ICD-10-CM | POA: Diagnosis not present

## 2023-12-28 DIAGNOSIS — D62 Acute posthemorrhagic anemia: Secondary | ICD-10-CM | POA: Diagnosis not present

## 2024-01-01 DIAGNOSIS — I10 Essential (primary) hypertension: Secondary | ICD-10-CM | POA: Diagnosis not present

## 2024-01-01 DIAGNOSIS — R278 Other lack of coordination: Secondary | ICD-10-CM | POA: Diagnosis not present

## 2024-01-01 DIAGNOSIS — D62 Acute posthemorrhagic anemia: Secondary | ICD-10-CM | POA: Diagnosis not present

## 2024-01-01 DIAGNOSIS — Z741 Need for assistance with personal care: Secondary | ICD-10-CM | POA: Diagnosis not present

## 2024-01-01 DIAGNOSIS — S72352D Displaced comminuted fracture of shaft of left femur, subsequent encounter for closed fracture with routine healing: Secondary | ICD-10-CM | POA: Diagnosis not present

## 2024-01-01 DIAGNOSIS — Z9181 History of falling: Secondary | ICD-10-CM | POA: Diagnosis not present

## 2024-01-02 DIAGNOSIS — I10 Essential (primary) hypertension: Secondary | ICD-10-CM | POA: Diagnosis not present

## 2024-01-02 DIAGNOSIS — Z9181 History of falling: Secondary | ICD-10-CM | POA: Diagnosis not present

## 2024-01-02 DIAGNOSIS — S72352D Displaced comminuted fracture of shaft of left femur, subsequent encounter for closed fracture with routine healing: Secondary | ICD-10-CM | POA: Diagnosis not present

## 2024-01-02 DIAGNOSIS — Z741 Need for assistance with personal care: Secondary | ICD-10-CM | POA: Diagnosis not present

## 2024-01-02 DIAGNOSIS — D62 Acute posthemorrhagic anemia: Secondary | ICD-10-CM | POA: Diagnosis not present

## 2024-01-02 DIAGNOSIS — R278 Other lack of coordination: Secondary | ICD-10-CM | POA: Diagnosis not present

## 2024-01-03 DIAGNOSIS — S72352D Displaced comminuted fracture of shaft of left femur, subsequent encounter for closed fracture with routine healing: Secondary | ICD-10-CM | POA: Diagnosis not present

## 2024-01-03 DIAGNOSIS — Z741 Need for assistance with personal care: Secondary | ICD-10-CM | POA: Diagnosis not present

## 2024-01-03 DIAGNOSIS — Z9181 History of falling: Secondary | ICD-10-CM | POA: Diagnosis not present

## 2024-01-03 DIAGNOSIS — I10 Essential (primary) hypertension: Secondary | ICD-10-CM | POA: Diagnosis not present

## 2024-01-03 DIAGNOSIS — R278 Other lack of coordination: Secondary | ICD-10-CM | POA: Diagnosis not present

## 2024-01-03 DIAGNOSIS — D62 Acute posthemorrhagic anemia: Secondary | ICD-10-CM | POA: Diagnosis not present

## 2024-01-04 DIAGNOSIS — Z9181 History of falling: Secondary | ICD-10-CM | POA: Diagnosis not present

## 2024-01-04 DIAGNOSIS — S72352D Displaced comminuted fracture of shaft of left femur, subsequent encounter for closed fracture with routine healing: Secondary | ICD-10-CM | POA: Diagnosis not present

## 2024-01-04 DIAGNOSIS — Z741 Need for assistance with personal care: Secondary | ICD-10-CM | POA: Diagnosis not present

## 2024-01-04 DIAGNOSIS — D62 Acute posthemorrhagic anemia: Secondary | ICD-10-CM | POA: Diagnosis not present

## 2024-01-04 DIAGNOSIS — R278 Other lack of coordination: Secondary | ICD-10-CM | POA: Diagnosis not present

## 2024-01-04 DIAGNOSIS — I10 Essential (primary) hypertension: Secondary | ICD-10-CM | POA: Diagnosis not present

## 2024-01-08 DIAGNOSIS — R278 Other lack of coordination: Secondary | ICD-10-CM | POA: Diagnosis not present

## 2024-01-08 DIAGNOSIS — D62 Acute posthemorrhagic anemia: Secondary | ICD-10-CM | POA: Diagnosis not present

## 2024-01-08 DIAGNOSIS — Z741 Need for assistance with personal care: Secondary | ICD-10-CM | POA: Diagnosis not present

## 2024-01-08 DIAGNOSIS — I10 Essential (primary) hypertension: Secondary | ICD-10-CM | POA: Diagnosis not present

## 2024-01-08 DIAGNOSIS — S72352D Displaced comminuted fracture of shaft of left femur, subsequent encounter for closed fracture with routine healing: Secondary | ICD-10-CM | POA: Diagnosis not present

## 2024-01-08 DIAGNOSIS — Z9181 History of falling: Secondary | ICD-10-CM | POA: Diagnosis not present

## 2024-01-09 DIAGNOSIS — D62 Acute posthemorrhagic anemia: Secondary | ICD-10-CM | POA: Diagnosis not present

## 2024-01-09 DIAGNOSIS — S72352D Displaced comminuted fracture of shaft of left femur, subsequent encounter for closed fracture with routine healing: Secondary | ICD-10-CM | POA: Diagnosis not present

## 2024-01-09 DIAGNOSIS — R278 Other lack of coordination: Secondary | ICD-10-CM | POA: Diagnosis not present

## 2024-01-09 DIAGNOSIS — Z741 Need for assistance with personal care: Secondary | ICD-10-CM | POA: Diagnosis not present

## 2024-01-09 DIAGNOSIS — I10 Essential (primary) hypertension: Secondary | ICD-10-CM | POA: Diagnosis not present

## 2024-01-09 DIAGNOSIS — Z9181 History of falling: Secondary | ICD-10-CM | POA: Diagnosis not present

## 2024-01-10 DIAGNOSIS — R278 Other lack of coordination: Secondary | ICD-10-CM | POA: Diagnosis not present

## 2024-01-10 DIAGNOSIS — S72352D Displaced comminuted fracture of shaft of left femur, subsequent encounter for closed fracture with routine healing: Secondary | ICD-10-CM | POA: Diagnosis not present

## 2024-01-10 DIAGNOSIS — Z741 Need for assistance with personal care: Secondary | ICD-10-CM | POA: Diagnosis not present

## 2024-01-10 DIAGNOSIS — Z9181 History of falling: Secondary | ICD-10-CM | POA: Diagnosis not present

## 2024-01-10 DIAGNOSIS — D62 Acute posthemorrhagic anemia: Secondary | ICD-10-CM | POA: Diagnosis not present

## 2024-01-10 DIAGNOSIS — I10 Essential (primary) hypertension: Secondary | ICD-10-CM | POA: Diagnosis not present

## 2024-01-15 DIAGNOSIS — S72352D Displaced comminuted fracture of shaft of left femur, subsequent encounter for closed fracture with routine healing: Secondary | ICD-10-CM | POA: Diagnosis not present

## 2024-01-15 DIAGNOSIS — Z741 Need for assistance with personal care: Secondary | ICD-10-CM | POA: Diagnosis not present

## 2024-01-15 DIAGNOSIS — D62 Acute posthemorrhagic anemia: Secondary | ICD-10-CM | POA: Diagnosis not present

## 2024-01-15 DIAGNOSIS — R278 Other lack of coordination: Secondary | ICD-10-CM | POA: Diagnosis not present

## 2024-01-15 DIAGNOSIS — M13862 Other specified arthritis, left knee: Secondary | ICD-10-CM | POA: Diagnosis not present

## 2024-01-15 DIAGNOSIS — Z9181 History of falling: Secondary | ICD-10-CM | POA: Diagnosis not present

## 2024-01-15 DIAGNOSIS — I10 Essential (primary) hypertension: Secondary | ICD-10-CM | POA: Diagnosis not present

## 2024-01-16 DIAGNOSIS — I10 Essential (primary) hypertension: Secondary | ICD-10-CM | POA: Diagnosis not present

## 2024-01-16 DIAGNOSIS — Z9181 History of falling: Secondary | ICD-10-CM | POA: Diagnosis not present

## 2024-01-16 DIAGNOSIS — Z741 Need for assistance with personal care: Secondary | ICD-10-CM | POA: Diagnosis not present

## 2024-01-16 DIAGNOSIS — R278 Other lack of coordination: Secondary | ICD-10-CM | POA: Diagnosis not present

## 2024-01-16 DIAGNOSIS — D62 Acute posthemorrhagic anemia: Secondary | ICD-10-CM | POA: Diagnosis not present

## 2024-01-16 DIAGNOSIS — S72352D Displaced comminuted fracture of shaft of left femur, subsequent encounter for closed fracture with routine healing: Secondary | ICD-10-CM | POA: Diagnosis not present

## 2024-01-17 DIAGNOSIS — D62 Acute posthemorrhagic anemia: Secondary | ICD-10-CM | POA: Diagnosis not present

## 2024-01-17 DIAGNOSIS — Z9181 History of falling: Secondary | ICD-10-CM | POA: Diagnosis not present

## 2024-01-17 DIAGNOSIS — R278 Other lack of coordination: Secondary | ICD-10-CM | POA: Diagnosis not present

## 2024-01-17 DIAGNOSIS — S72352D Displaced comminuted fracture of shaft of left femur, subsequent encounter for closed fracture with routine healing: Secondary | ICD-10-CM | POA: Diagnosis not present

## 2024-01-17 DIAGNOSIS — Z741 Need for assistance with personal care: Secondary | ICD-10-CM | POA: Diagnosis not present

## 2024-01-17 DIAGNOSIS — I10 Essential (primary) hypertension: Secondary | ICD-10-CM | POA: Diagnosis not present

## 2024-01-18 DIAGNOSIS — R278 Other lack of coordination: Secondary | ICD-10-CM | POA: Diagnosis not present

## 2024-01-18 DIAGNOSIS — Z9181 History of falling: Secondary | ICD-10-CM | POA: Diagnosis not present

## 2024-01-18 DIAGNOSIS — S72352D Displaced comminuted fracture of shaft of left femur, subsequent encounter for closed fracture with routine healing: Secondary | ICD-10-CM | POA: Diagnosis not present

## 2024-01-18 DIAGNOSIS — Z741 Need for assistance with personal care: Secondary | ICD-10-CM | POA: Diagnosis not present

## 2024-01-18 DIAGNOSIS — I10 Essential (primary) hypertension: Secondary | ICD-10-CM | POA: Diagnosis not present

## 2024-01-18 DIAGNOSIS — D62 Acute posthemorrhagic anemia: Secondary | ICD-10-CM | POA: Diagnosis not present

## 2024-01-22 DIAGNOSIS — R2689 Other abnormalities of gait and mobility: Secondary | ICD-10-CM | POA: Diagnosis not present

## 2024-01-22 DIAGNOSIS — D62 Acute posthemorrhagic anemia: Secondary | ICD-10-CM | POA: Diagnosis not present

## 2024-01-22 DIAGNOSIS — R278 Other lack of coordination: Secondary | ICD-10-CM | POA: Diagnosis not present

## 2024-01-22 DIAGNOSIS — S72352D Displaced comminuted fracture of shaft of left femur, subsequent encounter for closed fracture with routine healing: Secondary | ICD-10-CM | POA: Diagnosis not present

## 2024-01-22 DIAGNOSIS — I10 Essential (primary) hypertension: Secondary | ICD-10-CM | POA: Diagnosis not present

## 2024-01-22 DIAGNOSIS — M6281 Muscle weakness (generalized): Secondary | ICD-10-CM | POA: Diagnosis not present

## 2024-01-22 DIAGNOSIS — Z741 Need for assistance with personal care: Secondary | ICD-10-CM | POA: Diagnosis not present

## 2024-01-22 DIAGNOSIS — Z9181 History of falling: Secondary | ICD-10-CM | POA: Diagnosis not present

## 2024-01-24 DIAGNOSIS — S72352D Displaced comminuted fracture of shaft of left femur, subsequent encounter for closed fracture with routine healing: Secondary | ICD-10-CM | POA: Diagnosis not present

## 2024-01-24 DIAGNOSIS — Z9181 History of falling: Secondary | ICD-10-CM | POA: Diagnosis not present

## 2024-01-24 DIAGNOSIS — Z741 Need for assistance with personal care: Secondary | ICD-10-CM | POA: Diagnosis not present

## 2024-01-24 DIAGNOSIS — D62 Acute posthemorrhagic anemia: Secondary | ICD-10-CM | POA: Diagnosis not present

## 2024-01-24 DIAGNOSIS — I10 Essential (primary) hypertension: Secondary | ICD-10-CM | POA: Diagnosis not present

## 2024-01-24 DIAGNOSIS — R278 Other lack of coordination: Secondary | ICD-10-CM | POA: Diagnosis not present

## 2024-02-28 DIAGNOSIS — Z8781 Personal history of (healed) traumatic fracture: Secondary | ICD-10-CM | POA: Diagnosis not present

## 2024-02-28 DIAGNOSIS — Z9889 Other specified postprocedural states: Secondary | ICD-10-CM | POA: Diagnosis not present

## 2024-04-02 ENCOUNTER — Encounter: Admitting: Nurse Practitioner

## 2024-04-04 ENCOUNTER — Ambulatory Visit: Payer: Medicare Other | Admitting: Nurse Practitioner

## 2024-04-04 ENCOUNTER — Encounter: Payer: Self-pay | Admitting: Nurse Practitioner

## 2024-04-04 VITALS — BP 128/66 | HR 67 | Temp 97.7°F | Ht 59.0 in | Wt 135.0 lb

## 2024-04-04 DIAGNOSIS — I872 Venous insufficiency (chronic) (peripheral): Secondary | ICD-10-CM | POA: Diagnosis not present

## 2024-04-04 DIAGNOSIS — I10 Essential (primary) hypertension: Secondary | ICD-10-CM | POA: Diagnosis not present

## 2024-04-04 DIAGNOSIS — K5903 Drug induced constipation: Secondary | ICD-10-CM | POA: Diagnosis not present

## 2024-04-04 DIAGNOSIS — R600 Localized edema: Secondary | ICD-10-CM | POA: Diagnosis not present

## 2024-04-04 DIAGNOSIS — E785 Hyperlipidemia, unspecified: Secondary | ICD-10-CM

## 2024-04-04 DIAGNOSIS — D509 Iron deficiency anemia, unspecified: Secondary | ICD-10-CM | POA: Diagnosis not present

## 2024-04-04 DIAGNOSIS — M810 Age-related osteoporosis without current pathological fracture: Secondary | ICD-10-CM | POA: Diagnosis not present

## 2024-04-04 NOTE — Progress Notes (Signed)
 Careteam: Patient Care Team: Sharon Seller, NP as PCP - General (Geriatric Medicine) PLACE OF SERVICE:  [facility]   Advanced Directive information Does Patient Have a Medical Advance Directive?: Yes, Type of Advance Directive: Out of facility DNR (pink MOST or yellow form), Does patient want to make changes to medical advance directive?: No - Patient declined  Allergies  Allergen Reactions   Lactose     Chief Complaint  Patient presents with   Medical Management of Chronic Issues    Medical Management of Chronic Issues. 4 Month follow up    Discussed the use of AI scribe software for clinical note transcription with the patient, who gave verbal consent to proceed.  History of Present Illness   Lori Edwards is a 89 year old female who presents for a routine four-month follow-up visit.  Approximately six months ago, she sustained a left femur fracture that was surgically repaired. She currently experiences no pain and has been discharged from orthopedic follow-up. She is no longer attending physical therapy.  She has a history of hyperlipidemia and is currently taking a red yeast supplement to manage her cholesterol levels. She is awaiting updated lab results to assess her current status.  She has osteoporosis and received a Reclast infusion in May of the previous year. She was advised against continuing the same medication due to the type of fracture she sustained.  She reports bilateral leg swelling. She consumes a high-salt diet, which may contribute to the swelling. She uses compression hose and elevates her legs to manage the swelling.  No issues with constipation, chest pain, shortness of breath, or allergies. She is careful to avoid falls and has resumed dancing.       Review of Systems:  Review of Systems  Constitutional:  Negative for chills, fever and weight loss.  HENT:  Negative for tinnitus.   Respiratory:  Negative for cough, sputum  production and shortness of breath.   Cardiovascular:  Positive for leg swelling. Negative for chest pain and palpitations.  Gastrointestinal:  Negative for abdominal pain, constipation, diarrhea and heartburn.  Genitourinary:  Negative for dysuria, frequency and urgency.  Musculoskeletal:  Negative for back pain, falls, joint pain and myalgias.  Skin: Negative.   Neurological:  Negative for dizziness and headaches.  Psychiatric/Behavioral:  Negative for depression and memory loss. The patient does not have insomnia.     Past Medical History:  Diagnosis Date   Hyperlipidemia    Hypertension    Past Surgical History:  Procedure Laterality Date   INTRAMEDULLARY (IM) NAIL INTERTROCHANTERIC Left 10/20/2023   Procedure: INTRAMEDULLARY (IM) NAIL INTERTROCHANTERIC;  Surgeon: Signa Kell, MD;  Location: [facility] ORS;  Service: Orthopedics;  Laterality: Left;   OPEN REDUCTION INTERNAL FIXATION (ORIF) DISTAL RADIAL FRACTURE Right 07/31/2017   Procedure: OPEN REDUCTION INTERNAL FIXATION (ORIF) DISTAL RADIAL FRACTURE;  Surgeon: Deeann Saint, MD;  Location: [facility] ORS;  Service: Orthopedics;  Laterality: Right;   Social History:   reports that she has never smoked. She has never used smokeless tobacco. She reports that she does not drink alcohol and does not use drugs.  Family History  Problem Relation Age of Onset   Stroke Other        Parent   Stroke Mother    Hypertension Mother    Hypertension Father     Medications: Patient's Medications  New Prescriptions   No medications on file  Previous Medications   ACETAMINOPHEN (TYLENOL) 500 MG TABLET    Take 2  tablets (1,000 mg total) by mouth every 8 (eight) hours.   AMLODIPINE (NORVASC) 5 MG TABLET    TAKE 1 TABLET (5 MG TOTAL) BY MOUTH DAILY.   CHOLECALCIFEROL (VITAMIN D3) 25 MCG (1000 UT) TABLET    Take 1,000 Units by mouth daily.   MULTIPLE VITAMIN (MULTIVITAMIN WITH MINERALS) TABS TABLET    Take 1 tablet by mouth daily.   RED YEAST RICE  EXTRACT (RED YEAST RICE PO)    Take by mouth. Twice daily by mouth  Modified Medications   No medications on file  Discontinued Medications   No medications on file    Physical Exam:  Vitals:   04/04/24 0859  BP: 128/66  Pulse: 67  Temp: 97.7 F (36.5 C)  SpO2: 98%  Weight: 135 lb (61.2 kg)  Height: 4\' 11"  (1.499 m)   Body mass index is 27.27 kg/m. Wt Readings from Last 3 Encounters:  04/04/24 135 lb (61.2 kg)  11/28/23 134 lb (60.8 kg)  11/08/23 136 lb 3.2 oz (61.8 kg)    Physical Exam Constitutional:      General: She is not in acute distress.    Appearance: She is well-developed. She is not diaphoretic.  HENT:     Head: Normocephalic and atraumatic.     Mouth/Throat:     Pharynx: No oropharyngeal exudate.  Eyes:     Conjunctiva/sclera: Conjunctivae normal.     Pupils: Pupils are equal, round, and reactive to light.  Cardiovascular:     Rate and Rhythm: Normal rate and regular rhythm.     Heart sounds: Normal heart sounds.  Pulmonary:     Effort: Pulmonary effort is normal.     Breath sounds: Normal breath sounds.  Abdominal:     General: Bowel sounds are normal.     Palpations: Abdomen is soft.  Musculoskeletal:     Cervical back: Normal range of motion and neck supple.     Right lower leg: Edema present.     Left lower leg: Edema present.  Skin:    General: Skin is warm and dry.  Neurological:     Mental Status: She is alert.  Psychiatric:        Mood and Affect: Mood normal.     Labs reviewed: Basic Metabolic Panel: Recent Labs    10/22/23 0423 10/23/23 0516 10/30/23 0000 11/30/23 0734  NA 135 133* 133* 139  K 3.8 3.9 3.6 4.3  CL 104 100 98* 103  CO2 25 25 30* 25  GLUCOSE 128* 121*  --  98  BUN 23 26* 14 19  CREATININE 0.74 0.76 0.8 0.98*  CALCIUM 8.5* 8.1* 8.2* 10.1   Liver Function Tests: Recent Labs    09/21/23 0800 10/19/23 2149  AST 21 30  ALT 21 26  ALKPHOS  --  68  BILITOT 0.6 0.7  PROT 7.6 8.1  ALBUMIN  --  4.2   No  results for input(s): "LIPASE", "AMYLASE" in the last 8760 hours. No results for input(s): "AMMONIA" in the last 8760 hours. CBC: Recent Labs    10/19/23 2149 10/20/23 0309 10/22/23 0423 10/23/23 0516 10/30/23 0000 11/30/23 0734  WBC 13.3*   < > 11.8* 12.1* 12.3 10.8  NEUTROABS 6.1  --   --   --  9,865.00 4,838  HGB 13.9   < > 11.5* 11.3* 10.3* 13.2  HCT 40.7   < > 32.9* 31.8* 31* 40.4  MCV 91.7   < > 88.2 89.6  --  95.5  PLT  331   < > 291 303 393 416*   < > = values in this interval not displayed.   Lipid Panel: Recent Labs    09/21/23 0800  CHOL 221*  HDL 48*  LDLCALC 147*  TRIG 134  CHOLHDL 4.6   TSH: No results for input(s): "TSH" in the last 8760 hours. A1C: Lab Results  Component Value Date   HGBA1C 5.7 03/19/2021     Assessment/Plan 1. Essential hypertension (Primary) -Blood pressure well controlled, goal bp <140/90 Continue current medications and dietary modifications follow metabolic panel - CBC with Differential/Platelet - Complete Metabolic Panel with eGFR  2. Iron deficiency anemia, unspecified iron deficiency anemia type -hgb stable on last lab, will follow up with next labs  3. Drug-induced constipation Controlled at this time  4. Age-related osteoporosis without current pathological fracture Recommended to take calcium 600 mg twice daily with Vitamin D 2000 units daily and weight bearing activity 30 mins/5 days a week  5. Hyperlipidemia, unspecified hyperlipidemia type -continues with dietary modification and red yeast rice - Lipid panel  6. Edema of lower leg due to peripheral venous insufficiency --encouraged to elevate legs above level of heart as tolerates, low sodium diet, compression hose as tolerates (on in am, off in pm)   Next appt: 6 months Aquila Delaughter K. Denney Fisherman  [facility] [phone]

## 2024-04-04 NOTE — Patient Instructions (Addendum)
 [facility] on 4/21 at 7:30 am   -to elevate legs above level of heart as tolerates low sodium diet compression hose as tolerates (on in am, off in pm)

## 2024-04-08 DIAGNOSIS — E785 Hyperlipidemia, unspecified: Secondary | ICD-10-CM | POA: Diagnosis not present

## 2024-04-08 DIAGNOSIS — I10 Essential (primary) hypertension: Secondary | ICD-10-CM | POA: Diagnosis not present

## 2024-04-09 LAB — CBC WITH DIFFERENTIAL/PLATELET
Absolute Lymphocytes: 2608 {cells}/uL (ref 850–3900)
Absolute Monocytes: 575 {cells}/uL (ref 200–950)
Basophils Absolute: 49 {cells}/uL (ref 0–200)
Basophils Relative: 0.6 %
Eosinophils Absolute: 194 {cells}/uL (ref 15–500)
Eosinophils Relative: 2.4 %
HCT: 39.5 % (ref 35.0–45.0)
Hemoglobin: 13 g/dL (ref 11.7–15.5)
MCH: 31.3 pg (ref 27.0–33.0)
MCHC: 32.9 g/dL (ref 32.0–36.0)
MCV: 95 fL (ref 80.0–100.0)
MPV: 11.8 fL (ref 7.5–12.5)
Monocytes Relative: 7.1 %
Neutro Abs: 4674 {cells}/uL (ref 1500–7800)
Neutrophils Relative %: 57.7 %
Platelets: 315 10*3/uL (ref 140–400)
RBC: 4.16 10*6/uL (ref 3.80–5.10)
RDW: 14.5 % (ref 11.0–15.0)
Total Lymphocyte: 32.2 %
WBC: 8.1 10*3/uL (ref 3.8–10.8)

## 2024-04-09 LAB — COMPLETE METABOLIC PANEL WITHOUT GFR
AG Ratio: 1.6 (calc) (ref 1.0–2.5)
ALT: 13 U/L (ref 6–29)
AST: 20 U/L (ref 10–35)
Albumin: 4.3 g/dL (ref 3.6–5.1)
Alkaline phosphatase (APISO): 77 U/L (ref 37–153)
BUN: 15 mg/dL (ref 7–25)
CO2: 28 mmol/L (ref 20–32)
Calcium: 9.6 mg/dL (ref 8.6–10.4)
Chloride: 107 mmol/L (ref 98–110)
Creat: 0.83 mg/dL (ref 0.60–0.95)
Globulin: 2.7 g/dL (ref 1.9–3.7)
Glucose, Bld: 88 mg/dL (ref 65–99)
Potassium: 4.2 mmol/L (ref 3.5–5.3)
Sodium: 140 mmol/L (ref 135–146)
Total Bilirubin: 0.5 mg/dL (ref 0.2–1.2)
Total Protein: 7 g/dL (ref 6.1–8.1)

## 2024-04-09 LAB — LIPID PANEL
Cholesterol: 203 mg/dL — ABNORMAL HIGH (ref <200)
HDL: 57 mg/dL (ref >=50)
LDL Cholesterol (Calc): 124 mg/dL — ABNORMAL HIGH
Non-HDL Cholesterol (Calc): 146 mg/dL — ABNORMAL HIGH (ref <130)
Total CHOL/HDL Ratio: 3.6 (calc) (ref <5.0)
Triglycerides: 114 mg/dL (ref <150)

## 2024-09-25 DIAGNOSIS — H2513 Age-related nuclear cataract, bilateral: Secondary | ICD-10-CM | POA: Diagnosis not present

## 2024-10-01 ENCOUNTER — Ambulatory Visit (SKILLED_NURSING_FACILITY): Payer: Medicare Other | Admitting: Nurse Practitioner

## 2024-10-01 ENCOUNTER — Encounter: Payer: Self-pay | Admitting: Nurse Practitioner

## 2024-10-01 VITALS — BP 128/76 | HR 68 | Temp 98.0°F | Ht 59.0 in | Wt 140.0 lb

## 2024-10-01 DIAGNOSIS — Z0000 Encounter for general adult medical examination without abnormal findings: Secondary | ICD-10-CM

## 2024-10-01 NOTE — Progress Notes (Signed)
 Subjective:   Lori Edwards is a 89 y.o. female who presents for Medicare Annual (Subsequent) preventive examination.  Visit Complete: In person  Cardiac Risk Factors include: advanced age (>51men, >66 women);hypertension     Objective:    Today's Vitals   10/01/24 0902  BP: 128/76  Pulse: 68  Temp: 98 F (36.7 C)  TempSrc: Temporal  SpO2: 98%  Weight: 140 lb (63.5 kg)  Height: 4' 11 (1.499 m)   Body mass index is 28.28 kg/m. Wt Readings from Last 3 Encounters:  10/01/24 140 lb (63.5 kg)  04/04/24 135 lb (61.2 kg)  11/28/23 134 lb (60.8 kg)        10/01/2024    8:01 AM 04/04/2024    9:02 AM 11/28/2023   10:21 AM 11/08/2023    8:03 AM 10/27/2023    9:36 AM 10/24/2023    9:33 AM 10/20/2023   10:49 AM  Advanced Directives  Does Patient Have a Medical Advance Directive? Yes Yes Yes Yes Yes Yes Yes  Type of Estate agent of [place];Living will;Out of facility DNR (pink MOST or yellow form) Out of facility DNR (pink MOST or yellow form) Healthcare Power of [place];Out of facility DNR (pink MOST or yellow form);Living will Healthcare Power of [place];Out of facility DNR (pink MOST or yellow form);Living will Healthcare Power of [place];Out of facility DNR (pink MOST or yellow form);Living will Healthcare Power of [place];Out of facility DNR (pink MOST or yellow form);Living will Living will  Does patient want to make changes to medical advance directive? No - Patient declined No - Patient declined No - Patient declined No - Patient declined No - Patient declined No - Patient declined No - Patient declined  Copy of Healthcare Power of Attorney in Chart? Yes - validated most recent copy scanned in chart (See row information)  Yes - validated most recent copy scanned in chart (See row information) Yes - validated most recent copy scanned in chart (See row information) Yes - validated most recent copy scanned in chart (See row information) Yes - validated  most recent copy scanned in chart (See row information)   Pre-existing out of facility DNR order (yellow form or pink MOST form) Yellow form placed in chart (order not valid for inpatient use)   Pink MOST form placed in chart (order not valid for inpatient use)       Current Medications (verified) Outpatient Encounter Medications as of 10/01/2024  Medication Sig   acetaminophen  (TYLENOL ) 500 MG tablet Take 2 tablets (1,000 mg total) by mouth every 8 (eight) hours.   amLODipine  (NORVASC ) 5 MG tablet TAKE 1 TABLET (5 MG TOTAL) BY MOUTH DAILY.   cholecalciferol  (VITAMIN D3) 25 MCG (1000 UT) tablet Take 1,000 Units by mouth daily.   Multiple Vitamin (MULTIVITAMIN WITH MINERALS) TABS tablet Take 1 tablet by mouth daily.   Red Yeast Rice Extract (RED YEAST RICE PO) Take by mouth. Twice daily by mouth   No facility-administered encounter medications on file as of 10/01/2024.    Allergies (verified) Lactose   History: Past Medical History:  Diagnosis Date   Hyperlipidemia    Hypertension    Past Surgical History:  Procedure Laterality Date   INTRAMEDULLARY (IM) NAIL INTERTROCHANTERIC Left 10/20/2023   Procedure: INTRAMEDULLARY (IM) NAIL INTERTROCHANTERIC;  Surgeon: Tobie Priest, MD;  Location: [facility] ORS;  Service: Orthopedics;  Laterality: Left;   OPEN REDUCTION INTERNAL FIXATION (ORIF) DISTAL RADIAL FRACTURE Right 07/31/2017   Procedure: OPEN REDUCTION INTERNAL FIXATION (ORIF)  DISTAL RADIAL FRACTURE;  Surgeon: Cleotilde Barrio, MD;  Location: [facility] ORS;  Service: Orthopedics;  Laterality: Right;   Family History  Problem Relation Age of Onset   Stroke Other        Parent   Stroke Mother    Hypertension Mother    Hypertension Father    Social History   Socioeconomic History   Marital status: Widowed    Spouse name: Not on file   Number of children: Not on file   Years of education: Not on file   Highest education level: Not on file  Occupational History   Not on file  Tobacco Use    Smoking status: Never   Smokeless tobacco: Never  Vaping Use   Vaping status: Never Used  Substance and Sexual Activity   Alcohol use: No   Drug use: No   Sexual activity: Not on file  Other Topics Concern   Not on file  Social History Narrative   Not on file   Social Drivers of Health   Financial Resource Strain: Low Risk  (07/12/2018)   Overall Financial Resource Strain (CARDIA)    Difficulty of Paying Living Expenses: Not hard at all  Food Insecurity: No Food Insecurity (04/04/2024)   Hunger Vital Sign    Worried About Running Out of Food in the Last Year: Never true    Ran Out of Food in the Last Year: Never true  Transportation Needs: No Transportation Needs (04/04/2024)   PRAPARE - Administrator, Civil Service (Medical): No    Lack of Transportation (Non-Medical): No  Physical Activity: Unknown (09/13/2019)   Exercise Vital Sign    Days of Exercise per Week: Not on file    Minutes of Exercise per Session: 30 min  Stress: No Stress Concern Present (07/12/2018)   [employer] of Occupational Health - Occupational Stress Questionnaire    Feeling of Stress : Not at all  Social Connections: Not on file    Tobacco Counseling Counseling given: Not Answered   Clinical Intake:  Pre-visit preparation completed: Yes  Pain : No/denies pain     BMI - recorded: 28 Nutritional Status: BMI 25 -29 Overweight Nutritional Risks: Unintentional weight gain Diabetes: No  How often do you need to have someone help you when you read instructions, pamphlets, or other written materials from your doctor or pharmacy?: 1 - Never         Activities of Daily Living    10/01/2024    9:16 AM 10/20/2023    5:59 AM  In your present state of health, do you have any difficulty performing the following activities:  Hearing? 0 0  Vision? 0 0  Difficulty concentrating or making decisions? 0 0  Walking or climbing stairs? 1   Comment hard time going up stairs    Dressing or bathing? 0   Doing errands, shopping? 0   Preparing Food and eating ? N   Using the Toilet? N   In the past six months, have you accidently leaked urine? N   Do you have problems with loss of bowel control? N   Managing your Medications? N   Managing your Finances? N   Housekeeping or managing your Housekeeping? N     Patient Care Team: Caro Harlene POUR, NP as PCP - General (Geriatric Medicine)  Indicate any recent Medical Services you may have received from other than [facility] providers in the past year (date may be approximate).     Assessment:  This is a routine wellness examination for Lori Edwards.  Hearing/Vision screen Hearing Screening - Comments:: No hearing issues  Vision Screening - Comments:: Last eye exam last Wednesday with [facility] in [place]   Goals Addressed   None    Depression Screen    10/01/2024    9:04 AM 11/28/2023   10:21 AM 09/26/2023    9:04 AM 03/30/2023    8:51 AM 09/27/2022    8:59 AM 09/22/2022    8:42 AM 09/21/2021    9:39 AM  PHQ 2/9 Scores  PHQ - 2 Score 0 0 0 0 0 0 0    Fall Risk    10/01/2024    9:04 AM 11/28/2023   10:21 AM 09/26/2023    9:04 AM 03/30/2023    8:51 AM 12/01/2022    8:25 AM  Fall Risk   Falls in the past year? 0 0 0 0 0  Number falls in past yr: 0 0 0 0 0  Injury with Fall? 0 1 0 0 0  Risk for fall due to : No Fall Risks  No Fall Risks  No Fall Risks  Follow up Falls evaluation completed  Falls evaluation completed  Falls evaluation completed      Data saved with a previous flowsheet row definition    MEDICARE RISK AT HOME: Medicare Risk at Home Any stairs in or around the home?: No Home free of loose throw rugs in walkways, pet beds, electrical cords, etc?: Yes Adequate lighting in your home to reduce risk of falls?: Yes Life alert?: No Use of a cane, walker or w/c?: No Grab bars in the bathroom?: Yes Shower chair or bench in shower?: Yes Elevated toilet seat or a handicapped toilet?:  Yes  TIMED UP AND GO:  Was the test performed?  No    Cognitive Function:    09/26/2023    9:11 AM 09/21/2021    9:41 AM 09/15/2020    9:01 AM 07/12/2018    9:42 AM  MMSE - Mini Mental State Exam  Orientation to time 4 5 5 5   Orientation to Place 5 5 5 5   Registration 3 3 3 3   Attention/ Calculation 5 5 5 5   Recall 3 3 3 3   Language- name 2 objects 2 2 2 2   Language- repeat 1 0 1 1  Language- follow 3 step command 3 3 3 3   Language- read & follow direction 1 1 1 1   Write a sentence 1 1 1 1   Copy design 1 1 1 1   Total score 29 29 30 30         10/01/2024    9:07 AM 09/22/2022    8:45 AM 09/13/2019   10:13 AM  6CIT Screen  What Year? 0 points 0 points 0 points  What month? 0 points 0 points 0 points  What time? 0 points 0 points 0 points  Count back from 20 0 points 0 points 0 points  Months in reverse 0 points 0 points 0 points  Repeat phrase 0 points 0 points 0 points  Total Score 0 points 0 points 0 points    Immunizations Immunization History  Administered Date(s) Administered   Fluad Quad(high Dose 65+) 08/29/2019   INFLUENZA, HIGH DOSE SEASONAL PF 08/25/2016, 09/07/2017, 09/10/2018, 09/12/2020, 10/05/2022   Influenza-Unspecified 10/12/2023   Moderna Covid-19 Vaccine Bivalent Booster 41yrs & up 05/17/2022   Moderna Sars-Covid-2 Vaccination 12/31/2019, 01/28/2020, 05/06/2021   PFIZER(Purple Top)SARS-COV-2 Vaccination 09/09/2021   Pneumococcal Conjugate-13 08/03/2017  Pneumococcal Polysaccharide-23 09/18/2006   Tdap 09/10/2018   Zoster Recombinant(Shingrix) 09/13/2019, 03/07/2020    TDAP status: Up to date  Flu Vaccine status: Due, Education has been provided regarding the importance of this vaccine. Advised may receive this vaccine at local pharmacy or Health Dept. Aware to provide a copy of the vaccination record if obtained from local pharmacy or Health Dept. Verbalized acceptance and understanding.  Pneumococcal vaccine status: Up to date  Covid-19  vaccine status: Information provided on how to obtain vaccines.   Qualifies for Shingles Vaccine? Yes   Zostavax completed No   Shingrix Completed?: Yes  Screening Tests Health Maintenance  Topic Date Due   Influenza Vaccine  07/19/2024   Medicare Annual Wellness (AWV)  10/01/2025   DTaP/Tdap/Td (2 - Td or Tdap) 09/10/2028   Pneumococcal Vaccine: 50+ Years  Completed   DEXA SCAN  Completed   Zoster Vaccines- Shingrix  Completed   Meningococcal B Vaccine  Aged Out   COVID-19 Vaccine  Discontinued    Health Maintenance  Health Maintenance Due  Topic Date Due   Influenza Vaccine  07/19/2024    Colorectal cancer screening: No longer required.   Mammogram status: No longer required due to age.   Lung Cancer Screening: (Low Dose CT Chest recommended if Age 83-80 years, 20 pack-year currently smoking OR have quit w/in 15years.) does not qualify.   Lung Cancer Screening Referral: na  Additional Screening:  Hepatitis C Screening: does not qualify; Completed   Vision Screening: Recommended annual ophthalmology exams for early detection of glaucoma and other disorders of the eye. Is the patient up to date with their annual eye exam?  Yes  Who is the provider or what is the name of the office in which the patient attends annual eye exams? [facility] in [place] If pt is not established with a provider, would they like to be referred to a provider to establish care? No .   Dental Screening: Recommended annual dental exams for proper oral hygiene  Community Resource Referral / Chronic Care Management: CRR required this visit?  No   CCM required this visit?  No     Plan:     I have personally reviewed and noted the following in the patient's chart:   Medical and social history Use of alcohol, tobacco or illicit drugs  Current medications and supplements including opioid prescriptions. Patient is not currently taking opioid prescriptions. Functional ability and  status Nutritional status Physical activity Advanced directives List of other physicians Hospitalizations, surgeries, and ER visits in previous 12 months Vitals Screenings to include cognitive, depression, and falls Referrals and appointments  In addition, I have reviewed and discussed with patient certain preventive protocols, quality metrics, and best practice recommendations. A written personalized care plan for preventive services as well as general preventive health recommendations were provided to patient.     Harlene MARLA An, NP   10/01/2024

## 2024-10-01 NOTE — Progress Notes (Deleted)
 SABRA

## 2024-10-03 DIAGNOSIS — Z23 Encounter for immunization: Secondary | ICD-10-CM | POA: Diagnosis not present

## 2024-10-10 ENCOUNTER — Ambulatory Visit: Admitting: Nurse Practitioner

## 2024-10-10 ENCOUNTER — Encounter: Payer: Self-pay | Admitting: Nurse Practitioner

## 2024-10-10 VITALS — BP 136/78 | HR 70 | Temp 97.7°F | Ht 59.0 in | Wt 138.2 lb

## 2024-10-10 DIAGNOSIS — M810 Age-related osteoporosis without current pathological fracture: Secondary | ICD-10-CM | POA: Diagnosis not present

## 2024-10-10 DIAGNOSIS — I10 Essential (primary) hypertension: Secondary | ICD-10-CM | POA: Diagnosis not present

## 2024-10-10 DIAGNOSIS — K219 Gastro-esophageal reflux disease without esophagitis: Secondary | ICD-10-CM

## 2024-10-10 DIAGNOSIS — E785 Hyperlipidemia, unspecified: Secondary | ICD-10-CM | POA: Diagnosis not present

## 2024-10-10 DIAGNOSIS — M1712 Unilateral primary osteoarthritis, left knee: Secondary | ICD-10-CM | POA: Diagnosis not present

## 2024-10-10 MED ORDER — OMEPRAZOLE 20 MG PO CPDR: 20.0000 mg | DELAYED_RELEASE_CAPSULE | Freq: Every day | ORAL | 1 refills | Status: AC

## 2024-10-10 NOTE — Progress Notes (Signed)
 Careteam: Patient Care Team: Caro Harlene POUR, NP as PCP - General (Geriatric Medicine) PLACE OF SERVICE:  [facility]   Advanced Directive information    Allergies  Allergen Reactions   Lactose     Chief Complaint  Patient presents with   Medical Management of Chronic Issues    Medical Management of Chronic Issues. 6 Month follow up     HPI: Patient is a 89 y.o. female seen in today at [facility] Discussed the use of AI scribe software for clinical note transcription with the patient, who gave verbal consent to proceed.  History of Present Illness Lori Edwards is a 89 year old female with hypertension and osteoporosis who presents for a six-month follow-up visit.  She has no new concerns and is glad to be alive. She takes Norvasc  5 mg daily for blood pressure. Previous blood work in April showed normal results, including blood counts, kidney and liver functions, and electrolytes. Her cholesterol was slightly elevated, and she has since restarted red yeast rice.  She is taking vitamin D  for osteoporosis and is currently on a drug holiday from medications.   She engages in physical activity by dancing twice a day, once a week, and doing cardio once a week. She notes a difference in her ability to perform both activities in one day, now preferring to do just one. She received her flu shot this year and maintains her weight. She keeps up with dental and eye doctor appointments.  She experiences frequent heartburn, sometimes daily, but it is not severe. She occasionally uses baking soda. No chest pain, palpitations, shortness of breath, or radiation of pain. Her family history includes her mother, who experienced heartburn that was later associated with heart issues, leading to hospitalization and death. When she takes OTC medication or baking soda symptoms resolve.  Review of Systems:  Review of Systems  Constitutional:  Negative for chills, fever and weight  loss.  HENT:  Negative for tinnitus.   Respiratory:  Negative for cough, sputum production and shortness of breath.   Cardiovascular:  Negative for chest pain, palpitations and leg swelling.  Gastrointestinal:  Negative for abdominal pain, constipation, diarrhea and heartburn.  Genitourinary:  Negative for dysuria, frequency and urgency.  Musculoskeletal:  Negative for back pain, falls, joint pain and myalgias.  Skin: Negative.   Neurological:  Negative for dizziness and headaches.  Psychiatric/Behavioral:  Negative for depression and memory loss. The patient does not have insomnia.     Past Medical History:  Diagnosis Date   Hyperlipidemia    Hypertension    Past Surgical History:  Procedure Laterality Date   INTRAMEDULLARY (IM) NAIL INTERTROCHANTERIC Left 10/20/2023   Procedure: INTRAMEDULLARY (IM) NAIL INTERTROCHANTERIC;  Surgeon: Tobie Priest, MD;  Location: [facility] ORS;  Service: Orthopedics;  Laterality: Left;   OPEN REDUCTION INTERNAL FIXATION (ORIF) DISTAL RADIAL FRACTURE Right 07/31/2017   Procedure: OPEN REDUCTION INTERNAL FIXATION (ORIF) DISTAL RADIAL FRACTURE;  Surgeon: Cleotilde Barrio, MD;  Location: [facility] ORS;  Service: Orthopedics;  Laterality: Right;   Social History:   reports that she has never smoked. She has never used smokeless tobacco. She reports that she does not drink alcohol and does not use drugs.  Family History  Problem Relation Age of Onset   Stroke Other        Parent   Stroke Mother    Hypertension Mother    Hypertension Father     Medications: Patient's Medications  New Prescriptions  No medications on file  Previous Medications   ACETAMINOPHEN  (TYLENOL ) 500 MG TABLET    Take 2 tablets (1,000 mg total) by mouth every 8 (eight) hours.   AMLODIPINE  (NORVASC ) 5 MG TABLET    TAKE 1 TABLET (5 MG TOTAL) BY MOUTH DAILY.   CHOLECALCIFEROL  (VITAMIN D3) 25 MCG (1000 UT) TABLET    Take 1,000 Units by mouth daily.   MULTIPLE VITAMIN (MULTIVITAMIN WITH  MINERALS) TABS TABLET    Take 1 tablet by mouth daily.   RED YEAST RICE EXTRACT (RED YEAST RICE PO)    Take by mouth. Twice daily by mouth  Modified Medications   No medications on file  Discontinued Medications   No medications on file    Physical Exam:  Vitals:   10/10/24 0854  BP: 136/78  Pulse: 70  Temp: 97.7 F (36.5 C)  SpO2: 95%  Weight: 138 lb 3.2 oz (62.7 kg)  Height: 4' 11 (1.499 m)   Body mass index is 27.91 kg/m. Wt Readings from Last 3 Encounters:  10/10/24 138 lb 3.2 oz (62.7 kg)  10/01/24 140 lb (63.5 kg)  04/04/24 135 lb (61.2 kg)    Physical Exam Constitutional:      General: She is not in acute distress.    Appearance: She is well-developed. She is not diaphoretic.  HENT:     Head: Normocephalic and atraumatic.     Mouth/Throat:     Pharynx: No oropharyngeal exudate.  Eyes:     Conjunctiva/sclera: Conjunctivae normal.     Pupils: Pupils are equal, round, and reactive to light.  Cardiovascular:     Rate and Rhythm: Normal rate and regular rhythm.     Heart sounds: Normal heart sounds.  Pulmonary:     Effort: Pulmonary effort is normal.     Breath sounds: Normal breath sounds.  Abdominal:     General: Bowel sounds are normal.     Palpations: Abdomen is soft.  Musculoskeletal:     Cervical back: Normal range of motion and neck supple.     Right lower leg: No edema.     Left lower leg: No edema.  Skin:    General: Skin is warm and dry.  Neurological:     Mental Status: She is alert.  Psychiatric:        Mood and Affect: Mood normal.    Labs reviewed: Basic Metabolic Panel: Recent Labs    10/23/23 0516 10/30/23 0000 11/30/23 0734 04/08/24 0740  NA 133* 133* 139 140  K 3.9 3.6 4.3 4.2  CL 100 98* 103 107  CO2 25 30* 25 28  GLUCOSE 121*  --  98 88  BUN 26* 14 19 15   CREATININE 0.76 0.8 0.98* 0.83  CALCIUM 8.1* 8.2* 10.1 9.6   Liver Function Tests: Recent Labs    10/19/23 2149 04/08/24 0740  AST 30 20  ALT 26 13  ALKPHOS  68  --   BILITOT 0.7 0.5  PROT 8.1 7.0  ALBUMIN 4.2  --    No results for input(s): LIPASE, AMYLASE in the last 8760 hours. No results for input(s): AMMONIA in the last 8760 hours. CBC: Recent Labs    10/23/23 0516 10/30/23 0000 11/30/23 0734 04/08/24 0740  WBC 12.1* 12.3 10.8 8.1  NEUTROABS  --  9,865.00 4,838 4,674  HGB 11.3* 10.3* 13.2 13.0  HCT 31.8* 31* 40.4 39.5  MCV 89.6  --  95.5 95.0  PLT 303 393 416* 315   Lipid Panel: Recent Labs  04/08/24 0740  CHOL 203*  HDL 57  LDLCALC 124*  TRIG 114  CHOLHDL 3.6   TSH: No results for input(s): TSH in the last 8760 hours. A1C: Lab Results  Component Value Date   HGBA1C 5.7 03/19/2021     Assessment/Plan Assessment and Plan Assessment & Plan Gastroesophageal reflux disease (GERD) Frequent heartburn relieved by Tums and OTC. - Start omeprazole 20 mg daily for 6-8 weeks, reassess symptoms. - Provided dietary modification information to reduce acid reflux. - Advised caution with baking soda due to sodium content.  Hypertension Well controlled on Norvasc  5 mg daily.  Hyperlipidemia Previously elevated LDL cholesterol, restarted red yeast rice. - Recheck cholesterol levels in April.  Osteoarthritis Chronic knee pain due to osteoarthritis, stable without worsening of symptoms   Osteopenia Managed with vitamin D , on drug holiday from therapy.  Next appt:6 months, sooner if needed for worsening symptoms Tyrese Ficek K. Caro BODILY  [facility] [phone]

## 2024-10-10 NOTE — Patient Instructions (Addendum)
 To start omeprazole 20 mg by mouth daily- take for 2 months then stop- if heartburn or acid returns you can restart medication but need to take daily    Follow up labs on MONDAY APRIL 20th at 7:30 at [facility]

## 2024-12-07 ENCOUNTER — Other Ambulatory Visit: Payer: Self-pay | Admitting: Nurse Practitioner

## 2024-12-07 DIAGNOSIS — I10 Essential (primary) hypertension: Secondary | ICD-10-CM

## 2025-04-10 ENCOUNTER — Ambulatory Visit: Payer: Self-pay | Admitting: Nurse Practitioner

## 2025-10-02 ENCOUNTER — Ambulatory Visit: Payer: Self-pay | Admitting: Nurse Practitioner
